# Patient Record
Sex: Female | Born: 1965 | Race: White | Hispanic: No | Smoking: Never smoker
Health system: Southern US, Community
[De-identification: ages and names within clinical notes are randomized; demographics above are authoritative.]

## PROBLEM LIST (undated history)

## (undated) DIAGNOSIS — F419 Anxiety disorder, unspecified: Secondary | ICD-10-CM

## (undated) DIAGNOSIS — Z9889 Other specified postprocedural states: Secondary | ICD-10-CM

## (undated) DIAGNOSIS — F32A Depression, unspecified: Secondary | ICD-10-CM

## (undated) DIAGNOSIS — R112 Nausea with vomiting, unspecified: Secondary | ICD-10-CM

## (undated) DIAGNOSIS — Z8489 Family history of other specified conditions: Secondary | ICD-10-CM

## (undated) DIAGNOSIS — J189 Pneumonia, unspecified organism: Secondary | ICD-10-CM

## (undated) DIAGNOSIS — M1990 Unspecified osteoarthritis, unspecified site: Secondary | ICD-10-CM

## (undated) DIAGNOSIS — K219 Gastro-esophageal reflux disease without esophagitis: Secondary | ICD-10-CM

## (undated) DIAGNOSIS — Z8719 Personal history of other diseases of the digestive system: Secondary | ICD-10-CM

## (undated) DIAGNOSIS — F329 Major depressive disorder, single episode, unspecified: Secondary | ICD-10-CM

## (undated) DIAGNOSIS — R519 Headache, unspecified: Secondary | ICD-10-CM

## (undated) DIAGNOSIS — I10 Essential (primary) hypertension: Secondary | ICD-10-CM

## (undated) HISTORY — PX: BREAST SURGERY: SHX581

## (undated) HISTORY — PX: PTOSIS REPAIR: SHX6568

## (undated) HISTORY — PX: APPENDECTOMY: SHX54

## (undated) HISTORY — PX: TUBAL LIGATION: SHX77

## (undated) HISTORY — PX: VULVA / PERINEUM BIOPSY: SUR155

---
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
## 1998-10-19 ENCOUNTER — Other Ambulatory Visit: Admission: RE | Admit: 1998-10-19 | Discharge: 1998-10-19 | Payer: Self-pay | Admitting: Obstetrics and Gynecology

## 2000-02-09 ENCOUNTER — Other Ambulatory Visit: Admission: RE | Admit: 2000-02-09 | Discharge: 2000-02-09 | Payer: Self-pay | Admitting: Obstetrics and Gynecology

## 2000-02-13 ENCOUNTER — Encounter: Payer: Self-pay | Admitting: Obstetrics and Gynecology

## 2000-02-13 ENCOUNTER — Encounter: Admission: RE | Admit: 2000-02-13 | Discharge: 2000-02-13 | Payer: Self-pay | Admitting: Obstetrics and Gynecology

## 2001-04-15 ENCOUNTER — Other Ambulatory Visit: Admission: RE | Admit: 2001-04-15 | Discharge: 2001-04-15 | Payer: Self-pay | Admitting: Obstetrics and Gynecology

## 2004-06-20 ENCOUNTER — Encounter: Admission: RE | Admit: 2004-06-20 | Discharge: 2004-06-20 | Payer: Self-pay | Admitting: Gastroenterology

## 2004-06-28 ENCOUNTER — Ambulatory Visit (HOSPITAL_COMMUNITY): Admission: RE | Admit: 2004-06-28 | Discharge: 2004-06-28 | Payer: Self-pay | Admitting: Gastroenterology

## 2004-09-13 ENCOUNTER — Other Ambulatory Visit: Admission: RE | Admit: 2004-09-13 | Discharge: 2004-09-13 | Payer: Self-pay | Admitting: Gynecology

## 2006-04-09 HISTORY — PX: BREAST SURGERY: SHX581

## 2006-04-09 HISTORY — PX: ABDOMINAL HYSTERECTOMY: SHX81

## 2006-12-03 ENCOUNTER — Encounter: Admission: RE | Admit: 2006-12-03 | Discharge: 2006-12-03 | Payer: Self-pay | Admitting: Family Medicine

## 2007-05-30 ENCOUNTER — Ambulatory Visit (HOSPITAL_COMMUNITY): Admission: RE | Admit: 2007-05-30 | Discharge: 2007-05-30 | Payer: Self-pay | Admitting: *Deleted

## 2009-09-13 ENCOUNTER — Ambulatory Visit: Admission: RE | Admit: 2009-09-13 | Discharge: 2009-09-13 | Payer: Self-pay | Admitting: Gynecologic Oncology

## 2009-10-06 ENCOUNTER — Ambulatory Visit (HOSPITAL_COMMUNITY): Admission: RE | Admit: 2009-10-06 | Discharge: 2009-10-06 | Payer: Self-pay | Admitting: Obstetrics and Gynecology

## 2010-06-25 LAB — CBC
HCT: 37.1 % (ref 36.0–46.0)
Hemoglobin: 12.8 g/dL (ref 12.0–15.0)
MCH: 30.9 pg (ref 26.0–34.0)
MCHC: 34.5 g/dL (ref 30.0–36.0)
MCV: 89.6 fL (ref 78.0–100.0)
Platelets: 267 10*3/uL (ref 150–400)
RBC: 4.14 MIL/uL (ref 3.87–5.11)
RDW: 12.7 % (ref 11.5–15.5)
WBC: 6.9 10*3/uL (ref 4.0–10.5)

## 2010-06-25 LAB — HCG, SERUM, QUALITATIVE: Preg, Serum: NEGATIVE

## 2010-08-22 NOTE — Op Note (Signed)
NAMEKEREN, Kathy Nguyen                 ACCOUNT NO.:  [id number]   MEDICAL RECORD NO.:  [id number]          PATIENT TYPE:  AMB   LOCATION:  SDC                           FACILITY:  [facility]   PHYSICIAN:  [place] B. Earlene Plater, M.D.  DATE OF BIRTH:  September 12, 1965   DATE OF PROCEDURE:  05/30/2007  DATE OF DISCHARGE:                               OPERATIVE REPORT   PREOPERATIVE DIAGNOSIS:  Labial hypertrophy.   POSTOPERATIVE DIAGNOSIS:  Labial hypertrophy.   PROCEDURE:  Labioplasty.   SURGEON:  Chester Holstein. Earlene Plater, M.D.   ASSISTANT:  None.   ANESTHESIA:  LMA general and local 10 mL 1% Nesacaine.   FINDINGS:  Right greater than left labial hypertrophy.   SPECIMENS:  None.   ESTIMATED BLOOD LOSS:  Minimal.   COMPLICATIONS:  None.   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  In  addition, she has difficulty with hygiene as well as difficulty wearing  tight fitting clothes due to discomfort.  The patient requests reduction  of redundant labial tissue to help in this regard.  She is advised that  this is not a cosmetic procedure, but rather simply debulking of the  labial tissue to improve above symptoms.  The patient advised of the  risks of surgery including infection, bleeding, damage to surrounding  organs, and potential increased risk for dyspareunia postoperatively.   DESCRIPTION OF PROCEDURE:  The patient was taken to the operating room  and LMA anesthesia obtained.  She is prepped and draped in the usual  sterile fashion.  Bladder emptied with in-and-out catheter.  The labia  were inspected.  The left was substantially more prominent than the  right with essentially a 4 x 3 cm area of excessive redundant labial  tissue.  This area was infiltrated and incised with a scalpel.  The cut  edges were reapproximated in a running subcuticular fashion with 4-0  Vicryl.  The right side was inspected and  approximately 2 x 1 cm area of  redundant tissue noted.  The area was infiltrated and excised in the  same manner, closed with a running subcuticular  stitch of 4-0 Vicryl.  Hemostasis was obtained.  The patient tolerated  the procedure well without complications.  She was taken to the recovery  room awake, alert, and in stable condition.  All needle, sponge, and  instrument counts correct per the operating room staff.      [name] B. Earlene Plater, M.D.  Electronically Signed     WBD/MEDQ  D:  05/30/2007  T:  05/31/2007  Job:  [id number]

## 2010-08-25 NOTE — Op Note (Signed)
NAMECHRISTELLA, APP                 ACCOUNT NO.:  [id number]   MEDICAL RECORD NO.:  [id number]          PATIENT TYPE:  AMB   LOCATION:  ENDO                         FACILITY:  [facility]   PHYSICIAN:  Graylin Shiver, M.D.   DATE OF BIRTH:  Feb 26, 1966   DATE OF PROCEDURE:  06/28/2004  DATE OF DISCHARGE:                                 OPERATIVE REPORT   PROCEDURE PERFORMED:  Colonoscopy.   INDICATIONS FOR PROCEDURE:  Abdominal swelling, abdominal discomfort in the  lower abdomen.   Informed consent was obtained after explanation of the risks of bleeding,  infection and perforation.   PREMEDICATION:  Fentanyl 120 mcg IV, Versed 12 milligrams IV.   PROCEDURE:  With the patient in the left lateral decubitus position, a  rectal exam was performed. No masses were felt. The Olympus colonoscope was  inserted into the rectum and advanced around the colon to the cecum. Cecal  landmarks were identified.  The cecum and descending colon were normal. The  transverse colon normal. The descending colon, sigmoid and rectum were  normal. She tolerated the procedure well without complications.   IMPRESSION:  Normal colonoscopy to the cecum.   This patient also had a negative small bowel follow-through.  I think that  her symptoms are most likely secondary to irritable bowel syndrome.      SFG/MEDQ  D:  06/28/2004  T:  06/28/2004  Job:  [id number]   cc:   Leatha Gilding. Mezer, M.D.  [address]  [place]  [place] [address]  Fax: [phone]

## 2010-12-29 LAB — DIFFERENTIAL
Basophils Absolute: 0
Basophils Relative: 0
Eosinophils Absolute: 0.5
Eosinophils Relative: 7 — ABNORMAL HIGH
Lymphocytes Relative: 29
Lymphs Abs: 2
Monocytes Absolute: 0.4
Monocytes Relative: 5
Neutro Abs: 4
Neutrophils Relative %: 58

## 2010-12-29 LAB — CBC
HCT: 37.5
Hemoglobin: 13.1
MCHC: 35
MCV: 86.7
Platelets: 289
RBC: 4.33
RDW: 12.9
WBC: 6.9

## 2010-12-29 LAB — PREGNANCY, URINE: Preg Test, Ur: NEGATIVE

## 2019-07-22 ENCOUNTER — Ambulatory Visit: Payer: Self-pay | Admitting: Orthopaedic Surgery

## 2019-07-22 ENCOUNTER — Ambulatory Visit: Payer: Self-pay

## 2019-07-22 ENCOUNTER — Other Ambulatory Visit: Payer: Self-pay

## 2019-07-22 DIAGNOSIS — M25551 Pain in right hip: Secondary | ICD-10-CM

## 2019-07-22 DIAGNOSIS — M25559 Pain in unspecified hip: Secondary | ICD-10-CM

## 2019-07-22 NOTE — Progress Notes (Signed)
Office Visit Note   Patient: Kathy Nguyen           Date of Birth: October 29, 1965           MRN: [id number] Visit Date: 07/22/2019              Requested by: No referring provider defined for this encounter. PCP: System, Pcp Not In   Assessment & Plan: Visit Diagnoses:  1. 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  At this point given the failure conservative treatment a MRI is warranted to assess the soft tissues around the hip including the insertion of the hip abductor tendons.  Also would like to assess the cartilage of the hip itself and for any other pathology that can be causing the severity of her pain.  She will continue her ice regimen and stretching exercises as well as try topical anti-inflammatory such as Voltaren gel.  Once she has a MRI scheduled she will call us for follow-up appointment a few days afterwards.  Follow-Up Instructions: No follow-ups on file.   Orders:  Orders Placed This Encounter  Procedures  . XR HIP UNILAT W OR W/O PELVIS 1V RIGHT   No orders of the defined types were placed in this encounter.     Procedures: No procedures performed   Clinical Data: No additional findings.   Subjective: Chief Complaint  Patient presents with  . Right Hip - Pain  The patient is a very pleasant 54 year old female that I am seeing for the first time as a patient but have actually seen her before when I take care of her father.  She has been dealing with debilitating right hip pain for at least 7 to 8 months now.  It is on the lateral aspect of her right hip and her IT band.  She does have a little bit of groin pain with this as well.  She has had at least 2 trochanteric injections with steroid by her primary care physician.  The first 1 last year helped significantly but her second injection only lasted for about 4 days.  Both did give her relief but her pain is been quite severe at this point.   She is taking anti-inflammatories which were started to bother her stomach.  She said ice packs have helped her quite a bit.  Her pain is becoming debilitating for her and it is detriment affecting her mobility, her quality of life, and her actives daily living.  She is not injured this area before.  She is not a diabetic.  HPI  Review of Systems There currently no other current medical issues including no headache, chest pain, shortness of breath, fever, chills, nausea, vomiting  Objective: Vital Signs: There were no vitals taken for this visit.  Physical Exam She is alert and orient x3 and in no acute distress Ortho Exam Examination of her right hip shows that she is incredibly sensitive to touch and palpation over the trochanteric area and the IT band.  She also has pain around the groin with any attempts of internal and external rotation of that hip.  It is now more difficult for her to get on shoes and socks and cross her leg on the right side.  Her leg lengths are equal. Specialty Comments:  No specialty comments available.  Imaging: No results found. X-rays that accompany her of her right hip show well-maintained joint space  with no arthritic changes in the right hip and no cortical irregularities around the trochanteric area.  PMFS History: There are no problems to display for this patient.  No past medical history on file.  No family history on file.   Social History   Occupational History  . Not on file  Tobacco Use  . Smoking status: Not on file  Substance and Sexual Activity  . Alcohol use: Not on file  . Drug use: Not on file  . Sexual activity: Not on file

## 2019-08-12 ENCOUNTER — Other Ambulatory Visit: Payer: Self-pay

## 2019-08-12 ENCOUNTER — Ambulatory Visit
Admission: RE | Admit: 2019-08-12 | Discharge: 2019-08-12 | Disposition: A | Discharge status: Home / Self Care | Payer: 59 | Source: Ambulatory Visit | Attending: Orthopaedic Surgery | Admitting: Orthopaedic Surgery

## 2019-08-12 DIAGNOSIS — M25559 Pain in unspecified hip: Secondary | ICD-10-CM

## 2019-08-13 ENCOUNTER — Telehealth: Payer: Self-pay | Admitting: Orthopaedic Surgery

## 2019-08-13 ENCOUNTER — Ambulatory Visit: Payer: Self-pay | Admitting: Orthopaedic Surgery

## 2019-08-13 NOTE — Telephone Encounter (Signed)
Patient aware.

## 2019-08-13 NOTE — Telephone Encounter (Signed)
Let her know that we have not seen the results yet.  I have to wait to see what the radiologist says before I can call her with the results.  And may not be until Monday since I am out of the office operating tomorrow.  Do send this note back to me as a reminder.  Thanks.

## 2019-08-13 NOTE — Telephone Encounter (Signed)
Patient called advised she had MRI yesterday at 8:00am. Patient called asked if Dr Magnus Ivan could call her with the results. The number to contact patient is [phone]

## 2019-08-17 ENCOUNTER — Encounter: Payer: Self-pay | Admitting: Orthopaedic Surgery

## 2019-08-17 ENCOUNTER — Other Ambulatory Visit: Payer: Self-pay

## 2019-08-17 ENCOUNTER — Ambulatory Visit (INDEPENDENT_AMBULATORY_CARE_PROVIDER_SITE_OTHER): Payer: 59 | Admitting: Orthopaedic Surgery

## 2019-08-17 VITALS — Ht 65.35 in | Wt 162.4 lb

## 2019-08-17 DIAGNOSIS — M25551 Pain in right hip: Secondary | ICD-10-CM

## 2019-08-17 DIAGNOSIS — M1611 Unilateral primary osteoarthritis, right hip: Secondary | ICD-10-CM | POA: Diagnosis not present

## 2019-08-17 NOTE — Progress Notes (Signed)
Office Visit Note   Patient: Kathy Nguyen           Date of Birth: [birth year]           MRN: [id number] Visit Date: 08/17/2019              Requested by: No referring provider defined for this encounter. PCP: System, Pcp Not In   Assessment & Plan: Visit Diagnoses:  1. Pain in right hip   2. Unilateral primary osteoarthritis, right hip     Plan: She does have quite severe arthritis of her right hip on MRI findings.  This does correlate with her clinical exam as well.  She also has severe tendinosis of the trochanteric tendons and this is causing an associated bursitis.  At this point, I am recommending hip replacement given her MRI findings and clinical exam findings.  I explained in detail the rationale behind this recommendation and the risk and benefits involved.  At this point given the detrimental effect her hip pain is having on her quality of life combined with the MRI findings and the failed conservative treatment, she agrees with proceeding with hip replacement surgery.  We had a long thorough discussion about her interoperative and postoperative course.  We talked about the risk and benefits of surgery.  She is interested in having this scheduled.  All questions and concerns were answered and addressed.  Follow-Up Instructions: Return for 2 weeks post-op.   Orders:  No orders of the defined types were placed in this encounter.  No orders of the defined types were placed in this encounter.     Procedures: No procedures performed   Clinical Data: No additional findings.   Subjective: Chief Complaint  Patient presents with  . Right Hip - Follow-up  The patient comes in for follow-up after having a MRI of the right hip.  I was concerned about the severity of her right hip pain and the failure of multiple steroid injections over the trochanteric area that was done elsewhere.  She has significant pain that radiates from the hip into her groin and into her back with  down her leg as well to her knee.  Her plain films were negative for any type of abnormalities of the hip but due to the severity of her pain special rotation of the hip I felt MRI was warranted.  She is here for return to this today.  Her pain is daily and is 10 out of 10 with her right hip.  At this point is detriment affecting her mobility, quality of life and activities daily living and is getting worse.  She feels miserable from her right hip pain.  She has tried and failed conservative treatment for a long period time and at this point is quite frustrated due to her pain.  She is otherwise healthy individual and has high blood pressure for which she is treated medically.  HPI  Review of Systems She currently denies any headache, chest pain, shortness of breath, fever, chills, nausea, vomiting  Objective: Vital Signs: There were no vitals taken for this visit.  Physical Exam She is alert and orient x3 and in no acute distress but obvious discomfort Ortho Exam On exam she ambulates slowly.  She has severe pain with range of motion of her right hip especially in the groin. Specialty Comments:  No specialty comments available.  Imaging: No results found. I did review the MRI of her right hip with her.  We  looked at the images and there is significant cystic changes in the acetabulum of the right hip.  There is degenerative labral tearing as well.  There is cartilage loss of the right femoral head.  PMFS History: Patient Active Problem List   Diagnosis Date Noted  . Unilateral primary osteoarthritis, right hip 08/17/2019   History reviewed. No pertinent past medical history.  History reviewed. No pertinent family history.  History reviewed. No pertinent surgical history. Social History   Occupational History  . Not on file  Tobacco Use  . Smoking status: Not on file  Substance and Sexual Activity  . Alcohol use: Not on file  . Drug use: Not on file  . Sexual activity: Not on  file

## 2019-08-18 ENCOUNTER — Ambulatory Visit: Payer: Self-pay | Admitting: Orthopaedic Surgery

## 2019-08-25 ENCOUNTER — Other Ambulatory Visit: Payer: Self-pay

## 2019-08-25 ENCOUNTER — Telehealth: Payer: Self-pay | Admitting: Radiology

## 2019-08-25 NOTE — Telephone Encounter (Signed)
LMOM for patient of the below message  

## 2019-08-25 NOTE — Telephone Encounter (Signed)
I am fine with her having a dental procedure today.  Her surgery is not for 2 weeks and should not be an issue.

## 2019-08-25 NOTE — Telephone Encounter (Signed)
Patient left voicemail on triage line. She is scheduled for a dental appointment today where they will use a laser to remove gum tissue. This is not a root canal or filling, but is a dental procedure. She is scheduled for total hip on 09/08/2019, and after reading up on the procedure, thinks she may need to cancel the dental appt today until after surgery. Please call to advise whether or not this should be cancelled. Her appt is scheduled for 1pm.  CB [phone]

## 2019-08-27 ENCOUNTER — Other Ambulatory Visit: Payer: Self-pay | Admitting: Physician Assistant

## 2019-09-03 ENCOUNTER — Ambulatory Visit (INDEPENDENT_AMBULATORY_CARE_PROVIDER_SITE_OTHER): Payer: 59 | Admitting: Orthopaedic Surgery

## 2019-09-03 ENCOUNTER — Encounter: Payer: Self-pay | Admitting: Orthopaedic Surgery

## 2019-09-03 ENCOUNTER — Other Ambulatory Visit: Payer: Self-pay

## 2019-09-03 DIAGNOSIS — M1611 Unilateral primary osteoarthritis, right hip: Secondary | ICD-10-CM

## 2019-09-03 NOTE — Progress Notes (Signed)
[facility], [place] [address] Phone: [phone] Fax: [phone]      Your procedure is scheduled on September 08, 2019.  Report to [facility] Main Entrance "A" at 12:00 PM, and check in at the Admitting office.  Call this number if you have problems the morning of surgery:  [phone]  Call [phone] if you have any questions prior to your surgery date Monday-Friday 8am-4pm    Remember:  Do not eat after midnight the night before your surgery  You may drink clear liquids until 11:00 am the morning of your surgery.   Clear liquids allowed are: Water, Non-Citrus Juices (without pulp), Carbonated Beverages, Clear Tea, Black Coffee Only, and Gatorade   Enhanced Recovery after Surgery for Orthopedics Enhanced Recovery after Surgery is a protocol used to improve the stress on your body and your recovery after surgery.  Patient Instructions  . The night before surgery:  o No food after midnight. ONLY clear liquids after midnight  . The day of surgery (if you do NOT have diabetes):  o Drink ONE (1) Pre-Surgery Clear Ensure as directed.   o This drink was given to you during your hospital  pre-op appointment visit. o The pre-op nurse will instruct you on the time to drink the  Pre-Surgery Ensure depending on your surgery time. o Finish the drink at the designated time by 11:00 AM o Nothing else to drink after completing the  Pre-Surgery Clear Ensure.         If you have questions, please contact your surgeon's office.     Take these medicines the morning of surgery with A SIP OF WATER : Bupropion (Wellbutrin XL) Estradiol (Estrace) Alprazolam (Xanax) if needed Dicyclomine (Bentyl) if needed Loratadine (Claritin)  If needed Valacyclovir (Valtrex) if needed  As of today, STOP taking any Aspirin (unless otherwise instructed by your surgeon) and Aspirin containing products, Aleve, Naproxen, Ibuprofen,  Motrin, Advil, Goody's, BC's, all herbal medications, fish oil, and all vitamins.                     Do not wear jewelry, make up, or nail polish            Do not wear lotions, powders, perfumes/colognes, or deodorant.            Do not shave 48 hours prior to surgery.             Do not bring valuables to the hospital.            [facility] is not responsible for any belongings or valuables.  Do NOT Smoke (Tobacco/Vapping) or drink Alcohol 24 hours prior to your procedure If you use a CPAP at night, you may bring all equipment for your overnight stay.   Contacts, glasses, dentures or bridgework may not be worn into surgery.      For patients admitted to the hospital, discharge time will be determined by your treatment team.   Patients discharged the day of surgery will not be allowed to drive home, and someone needs to stay with them for 24 hours.    Special instructions:   [facility]- Preparing For Surgery  Before surgery, you can play an important role. Because skin is not sterile, your skin needs to be as free of germs as possible. You can reduce the number of germs on your skin by washing with CHG (chlorahexidine gluconate)  Soap before surgery.  CHG is an antiseptic cleaner which kills germs and bonds with the skin to continue killing germs even after washing.    Oral Hygiene is also important to reduce your risk of infection.  Remember - BRUSH YOUR TEETH THE MORNING OF SURGERY WITH YOUR REGULAR TOOTHPASTE  Please do not use if you have an allergy to CHG or antibacterial soaps. If your skin becomes reddened/irritated stop using the CHG.  Do not shave (including legs and underarms) for at least 48 hours prior to first CHG shower. It is OK to shave your face.  Please follow these instructions carefully.   1. Shower the NIGHT BEFORE SURGERY and the MORNING OF SURGERY with CHG Soap.   2. If you chose to wash your hair, wash your hair first as usual with your normal  shampoo.  3. After you shampoo, rinse your hair and body thoroughly to remove the shampoo.  4. Use CHG as you would any other liquid soap. You can apply CHG directly to the skin and wash gently with a scrungie or a clean washcloth.   5. Apply the CHG Soap to your body ONLY FROM THE NECK DOWN.  Do not use on open wounds or open sores. Avoid contact with your eyes, ears, mouth and genitals (private parts). Wash Face and genitals (private parts)  with your normal soap.   6. Wash thoroughly, paying special attention to the area where your surgery will be performed.  7. Thoroughly rinse your body with warm water from the neck down.  8. DO NOT shower/wash with your normal soap after using and rinsing off the CHG Soap.  9. Pat yourself dry with a CLEAN TOWEL.  10. Wear CLEAN PAJAMAS to bed the night before surgery, wear comfortable clothes the morning of surgery  11. Place CLEAN SHEETS on your bed the night of your first shower and DO NOT SLEEP WITH PETS.   Day of Surgery:   Do not apply any deodorants/lotions.  Please wear clean clothes to the hospital/surgery center.   Remember to brush your teeth WITH YOUR REGULAR TOOTHPASTE.   Please read over the following fact sheets that you were given.

## 2019-09-03 NOTE — Progress Notes (Signed)
The patient comes in today with appropriate questions as it relates to her hip replacement surgery next week.  She is scheduled for right total hip arthroplasty.  She is having a significant amount of pain and just feels tired and fatigued and sick.  I gave her reassurance that I have a lot of patients to get frustrated with just dealing with the pain of an arthritic joint and that can wear them out and wear them down.  She is having her preoperative labs tomorrow.  I gave her reassurance that if anything stands out with those labs we would let her know.  She had a lot of appropriate questions as a relates to the incision and how long she will be in the hospital and other questions as a relates to hip replacement surgery.  All his questions were answered and concerns addressed.  We will see her on this upcoming Tuesday for her surgery.

## 2019-09-04 ENCOUNTER — Encounter (HOSPITAL_COMMUNITY)
Admission: RE | Admit: 2019-09-04 | Discharge: 2019-09-04 | Disposition: A | Discharge status: Home / Self Care | Payer: 59 | Source: Ambulatory Visit | Attending: Orthopaedic Surgery | Admitting: Orthopaedic Surgery

## 2019-09-04 ENCOUNTER — Other Ambulatory Visit: Payer: Self-pay

## 2019-09-04 ENCOUNTER — Other Ambulatory Visit (HOSPITAL_COMMUNITY)
Admission: RE | Admit: 2019-09-04 | Discharge: 2019-09-04 | Disposition: A | Discharge status: Home / Self Care | Payer: 59 | Source: Ambulatory Visit | Attending: Orthopaedic Surgery | Admitting: Orthopaedic Surgery

## 2019-09-04 ENCOUNTER — Encounter (HOSPITAL_COMMUNITY): Payer: Self-pay

## 2019-09-04 DIAGNOSIS — Z20822 Contact with and (suspected) exposure to covid-19: Secondary | ICD-10-CM | POA: Insufficient documentation

## 2019-09-04 DIAGNOSIS — Z01818 Encounter for other preprocedural examination: Secondary | ICD-10-CM | POA: Insufficient documentation

## 2019-09-04 HISTORY — DX: Headache, unspecified: R51.9

## 2019-09-04 HISTORY — DX: Nausea with vomiting, unspecified: R11.2

## 2019-09-04 HISTORY — DX: Unspecified osteoarthritis, unspecified site: M19.90

## 2019-09-04 HISTORY — DX: Pneumonia, unspecified organism: J18.9

## 2019-09-04 HISTORY — DX: Depression, unspecified: F32.A

## 2019-09-04 HISTORY — DX: Gastro-esophageal reflux disease without esophagitis: K21.9

## 2019-09-04 HISTORY — DX: Anxiety disorder, unspecified: F41.9

## 2019-09-04 HISTORY — DX: Family history of other specified conditions: Z84.89

## 2019-09-04 HISTORY — DX: Essential (primary) hypertension: I10

## 2019-09-04 HISTORY — DX: Other specified postprocedural states: Z98.890

## 2019-09-04 HISTORY — DX: Personal history of other diseases of the digestive system: Z87.19

## 2019-09-04 LAB — BASIC METABOLIC PANEL
Anion gap: 9 (ref 5–15)
BUN: 12 mg/dL (ref 6–20)
CO2: 28 mmol/L (ref 22–32)
Calcium: 9.5 mg/dL (ref 8.9–10.3)
Chloride: 102 mmol/L (ref 98–111)
Creatinine, Ser: 0.97 mg/dL (ref 0.44–1.00)
GFR calc Af Amer: >60 mL/min (ref >60)
GFR calc non Af Amer: >60 mL/min (ref >60)
Glucose, Bld: 107 mg/dL — ABNORMAL HIGH (ref 70–99)
Potassium: 3.9 mmol/L (ref 3.5–5.1)
Sodium: 139 mmol/L (ref 135–145)

## 2019-09-04 LAB — ABO/RH: ABO/RH(D): O POS

## 2019-09-04 LAB — CBC
HCT: 40.4 % (ref 36.0–46.0)
Hemoglobin: 12.9 g/dL (ref 12.0–15.0)
MCH: 29.5 pg (ref 26.0–34.0)
MCHC: 31.9 g/dL (ref 30.0–36.0)
MCV: 92.2 fL (ref 80.0–100.0)
Platelets: 285 10*3/uL (ref 150–400)
RBC: 4.38 MIL/uL (ref 3.87–5.11)
RDW: 12.5 % (ref 11.5–15.5)
WBC: 6.5 10*3/uL (ref 4.0–10.5)
nRBC: 0 % (ref 0.0–0.2)

## 2019-09-04 LAB — SURGICAL PCR SCREEN
MRSA, PCR: NEGATIVE
Staphylococcus aureus: POSITIVE — AB

## 2019-09-04 LAB — TYPE AND SCREEN
ABO/RH(D): O POS
Antibody Screen: NEGATIVE

## 2019-09-04 LAB — SARS CORONAVIRUS 2 (TAT 6-24 HRS): SARS Coronavirus 2: NEGATIVE

## 2019-09-04 NOTE — Progress Notes (Signed)
PCP - Dr. Amada Kingfisher [facility] in [place] Cardiologist - Denies  Chest x-ray - N/A EKG -09/04/19  Stress Test - Denies ECHO - Denies Cardiac Cath - Denies  Sleep Study - Denies  DM - Denies  ERAS Protcol -Yes PRE-SURGERY Ensure or G2- Given  COVID TEST- 09/04/19  Anesthesia review: Not Needed  Patient denies shortness of breath, fever, cough and chest pain at PAT appointment   All instructions explained to the patient, with a verbal understanding of the material. Patient agrees to go over the instructions while at home for a better understanding. Patient also instructed to self quarantine after being tested for COVID-19. The opportunity to ask questions was provided.

## 2019-09-08 ENCOUNTER — Encounter (HOSPITAL_COMMUNITY)
Admission: AD | Disposition: A | Discharge status: Home Health | Payer: Self-pay | Source: Home / Self Care | Attending: Orthopaedic Surgery

## 2019-09-08 ENCOUNTER — Other Ambulatory Visit: Payer: Self-pay

## 2019-09-08 ENCOUNTER — Inpatient Hospital Stay (HOSPITAL_COMMUNITY)
Admission: AD | Admit: 2019-09-08 | Discharge: 2019-09-11 | DRG: 470 | Disposition: A | Discharge status: Home Health | Payer: 59 | Attending: Orthopaedic Surgery | Admitting: Orthopaedic Surgery

## 2019-09-08 ENCOUNTER — Ambulatory Visit (HOSPITAL_COMMUNITY): Payer: 59 | Admitting: Physician Assistant

## 2019-09-08 ENCOUNTER — Ambulatory Visit (HOSPITAL_COMMUNITY): Payer: 59

## 2019-09-08 ENCOUNTER — Ambulatory Visit (HOSPITAL_COMMUNITY): Payer: 59 | Admitting: Certified Registered Nurse Anesthetist

## 2019-09-08 ENCOUNTER — Encounter (HOSPITAL_COMMUNITY): Payer: Self-pay | Admitting: Orthopaedic Surgery

## 2019-09-08 ENCOUNTER — Observation Stay (HOSPITAL_COMMUNITY): Payer: 59

## 2019-09-08 DIAGNOSIS — Z419 Encounter for procedure for purposes other than remedying health state, unspecified: Secondary | ICD-10-CM

## 2019-09-08 DIAGNOSIS — M1611 Unilateral primary osteoarthritis, right hip: Principal | ICD-10-CM | POA: Diagnosis present

## 2019-09-08 DIAGNOSIS — Z96641 Presence of right artificial hip joint: Secondary | ICD-10-CM

## 2019-09-08 DIAGNOSIS — Z881 Allergy status to other antibiotic agents status: Secondary | ICD-10-CM

## 2019-09-08 DIAGNOSIS — K219 Gastro-esophageal reflux disease without esophagitis: Secondary | ICD-10-CM | POA: Diagnosis present

## 2019-09-08 DIAGNOSIS — Z90710 Acquired absence of both cervix and uterus: Secondary | ICD-10-CM

## 2019-09-08 DIAGNOSIS — I10 Essential (primary) hypertension: Secondary | ICD-10-CM | POA: Diagnosis present

## 2019-09-08 DIAGNOSIS — F329 Major depressive disorder, single episode, unspecified: Secondary | ICD-10-CM | POA: Diagnosis present

## 2019-09-08 DIAGNOSIS — F419 Anxiety disorder, unspecified: Secondary | ICD-10-CM | POA: Diagnosis present

## 2019-09-08 DIAGNOSIS — Z880 Allergy status to penicillin: Secondary | ICD-10-CM

## 2019-09-08 HISTORY — PX: TOTAL HIP ARTHROPLASTY: SHX124

## 2019-09-08 SURGERY — ARTHROPLASTY, HIP, TOTAL, ANTERIOR APPROACH
Anesthesia: Spinal | Site: Hip | Laterality: Right

## 2019-09-08 MED ORDER — MIDAZOLAM HCL 2 MG/2ML IJ SOLN
INTRAMUSCULAR | Status: AC
  Filled 2019-09-08: qty 2

## 2019-09-08 MED ORDER — OXYCODONE HCL 5 MG/5ML PO SOLN: 5.0000 mg | Freq: Once | ORAL | Status: DC | PRN

## 2019-09-08 MED ORDER — ORAL CARE MOUTH RINSE: 15.0000 mL | Freq: Once | OROMUCOSAL | Status: AC

## 2019-09-08 MED ORDER — LORATADINE 10 MG PO TABS: 10.0000 mg | ORAL_TABLET | Freq: Every day | ORAL | Status: DC | PRN

## 2019-09-08 MED ORDER — POLYETHYLENE GLYCOL 3350 17 G PO PACK: 17.0000 g | PACK | Freq: Every day | ORAL | Status: DC | PRN

## 2019-09-08 MED ORDER — DEXAMETHASONE SODIUM PHOSPHATE 10 MG/ML IJ SOLN
INTRAMUSCULAR | Status: AC
  Filled 2019-09-08: qty 1

## 2019-09-08 MED ORDER — FENTANYL CITRATE (PF) 100 MCG/2ML IJ SOLN
INTRAMUSCULAR | Status: AC
  Filled 2019-09-08: qty 2

## 2019-09-08 MED ORDER — KETOROLAC TROMETHAMINE 15 MG/ML IJ SOLN
INTRAMUSCULAR | Status: AC
  Filled 2019-09-08: qty 1

## 2019-09-08 MED ORDER — TRANEXAMIC ACID-NACL 1000-0.7 MG/100ML-% IV SOLN
1000.0000 mg | INTRAVENOUS | Status: AC
  Administered 2019-09-08: 1000 mg via INTRAVENOUS

## 2019-09-08 MED ORDER — ASPIRIN 81 MG PO CHEW
81.0000 mg | CHEWABLE_TABLET | Freq: Two times a day (BID) | ORAL | Status: DC
  Administered 2019-09-08 – 2019-09-11 (×6): 81 mg via ORAL
  Filled 2019-09-08 (×6): qty 1

## 2019-09-08 MED ORDER — CEFAZOLIN SODIUM-DEXTROSE 2-4 GM/100ML-% IV SOLN
INTRAVENOUS | Status: AC
  Filled 2019-09-08: qty 100

## 2019-09-08 MED ORDER — PANTOPRAZOLE SODIUM 40 MG PO TBEC
40.0000 mg | DELAYED_RELEASE_TABLET | Freq: Every day | ORAL | Status: DC
  Administered 2019-09-08 – 2019-09-11 (×4): 40 mg via ORAL
  Filled 2019-09-08 (×4): qty 1

## 2019-09-08 MED ORDER — PROMETHAZINE HCL 25 MG/ML IJ SOLN
6.2500 mg | INTRAMUSCULAR | Status: DC | PRN
  Administered 2019-09-08: 6.25 mg via INTRAVENOUS

## 2019-09-08 MED ORDER — OXYCODONE HCL 5 MG PO TABS
5.0000 mg | ORAL_TABLET | ORAL | Status: DC | PRN
  Administered 2019-09-08 – 2019-09-09 (×2): 10 mg via ORAL
  Filled 2019-09-08 (×4): qty 2

## 2019-09-08 MED ORDER — KETOROLAC TROMETHAMINE 15 MG/ML IJ SOLN
7.5000 mg | Freq: Four times a day (QID) | INTRAMUSCULAR | Status: AC
  Administered 2019-09-08 – 2019-09-09 (×4): 7.5 mg via INTRAVENOUS
  Filled 2019-09-08 (×3): qty 1

## 2019-09-08 MED ORDER — ROCURONIUM BROMIDE 10 MG/ML (PF) SYRINGE
PREFILLED_SYRINGE | INTRAVENOUS | Status: AC
  Filled 2019-09-08: qty 10

## 2019-09-08 MED ORDER — SODIUM CHLORIDE 0.9 % IR SOLN
Status: DC | PRN
  Administered 2019-09-08: 3000 mL

## 2019-09-08 MED ORDER — PHENYLEPHRINE 40 MCG/ML (10ML) SYRINGE FOR IV PUSH (FOR BLOOD PRESSURE SUPPORT)
PREFILLED_SYRINGE | INTRAVENOUS | Status: AC
  Filled 2019-09-08: qty 10

## 2019-09-08 MED ORDER — CEFAZOLIN SODIUM-DEXTROSE 1-4 GM/50ML-% IV SOLN
1.0000 g | Freq: Four times a day (QID) | INTRAVENOUS | Status: AC
  Administered 2019-09-08 – 2019-09-09 (×2): 1 g via INTRAVENOUS
  Filled 2019-09-08 (×2): qty 50

## 2019-09-08 MED ORDER — OXYCODONE HCL 5 MG PO TABS
10.0000 mg | ORAL_TABLET | ORAL | Status: DC | PRN
  Administered 2019-09-09 – 2019-09-10 (×4): 10 mg via ORAL
  Administered 2019-09-10 (×2): 15 mg via ORAL
  Administered 2019-09-11: 10 mg via ORAL
  Administered 2019-09-11 (×2): 15 mg via ORAL
  Filled 2019-09-08 (×4): qty 3
  Filled 2019-09-08: qty 2
  Filled 2019-09-08: qty 3
  Filled 2019-09-08 (×3): qty 2

## 2019-09-08 MED ORDER — BUPROPION HCL ER (XL) 150 MG PO TB24
300.0000 mg | ORAL_TABLET | Freq: Every day | ORAL | Status: DC
  Administered 2019-09-09 – 2019-09-11 (×3): 300 mg via ORAL
  Filled 2019-09-08 (×3): qty 2

## 2019-09-08 MED ORDER — HYDROMORPHONE HCL 1 MG/ML IJ SOLN
0.5000 mg | INTRAMUSCULAR | Status: DC | PRN
  Administered 2019-09-08 – 2019-09-10 (×7): 1 mg via INTRAVENOUS
  Filled 2019-09-08 (×7): qty 1

## 2019-09-08 MED ORDER — FENTANYL CITRATE (PF) 100 MCG/2ML IJ SOLN
INTRAMUSCULAR | Status: DC | PRN
  Administered 2019-09-08: 50 ug via INTRAVENOUS

## 2019-09-08 MED ORDER — CEFAZOLIN SODIUM-DEXTROSE 2-4 GM/100ML-% IV SOLN
2.0000 g | INTRAVENOUS | Status: AC
  Administered 2019-09-08: 2 g via INTRAVENOUS

## 2019-09-08 MED ORDER — HYDROCHLOROTHIAZIDE 12.5 MG PO CAPS
12.5000 mg | ORAL_CAPSULE | Freq: Every day | ORAL | Status: DC
  Administered 2019-09-08 – 2019-09-11 (×4): 12.5 mg via ORAL
  Filled 2019-09-08 (×4): qty 1

## 2019-09-08 MED ORDER — FENTANYL CITRATE (PF) 250 MCG/5ML IJ SOLN
INTRAMUSCULAR | Status: AC
  Filled 2019-09-08: qty 5

## 2019-09-08 MED ORDER — ACETAMINOPHEN 325 MG PO TABS
325.0000 mg | ORAL_TABLET | Freq: Four times a day (QID) | ORAL | Status: DC | PRN
  Administered 2019-09-10 – 2019-09-11 (×2): 650 mg via ORAL
  Filled 2019-09-08 (×2): qty 2

## 2019-09-08 MED ORDER — IRBESARTAN 150 MG PO TABS
150.0000 mg | ORAL_TABLET | Freq: Every day | ORAL | Status: DC
  Administered 2019-09-08 – 2019-09-11 (×4): 150 mg via ORAL
  Filled 2019-09-08 (×4): qty 1

## 2019-09-08 MED ORDER — VALACYCLOVIR HCL 500 MG PO TABS
500.0000 mg | ORAL_TABLET | Freq: Every day | ORAL | Status: DC
  Administered 2019-09-10: 500 mg via ORAL
  Filled 2019-09-08 (×3): qty 1

## 2019-09-08 MED ORDER — LACTATED RINGERS IV SOLN: INTRAVENOUS | Status: DC

## 2019-09-08 MED ORDER — TRANEXAMIC ACID-NACL 1000-0.7 MG/100ML-% IV SOLN
INTRAVENOUS | Status: AC
  Filled 2019-09-08: qty 100

## 2019-09-08 MED ORDER — PROPOFOL 10 MG/ML IV BOLUS
INTRAVENOUS | Status: DC | PRN
  Administered 2019-09-08: 30 mg via INTRAVENOUS
  Administered 2019-09-08 (×2): 10 mg via INTRAVENOUS
  Administered 2019-09-08: 20 mg via INTRAVENOUS
  Administered 2019-09-08 (×2): 10 mg via INTRAVENOUS

## 2019-09-08 MED ORDER — PROPOFOL 10 MG/ML IV BOLUS
INTRAVENOUS | Status: AC
  Filled 2019-09-08: qty 20

## 2019-09-08 MED ORDER — DEXAMETHASONE SODIUM PHOSPHATE 10 MG/ML IJ SOLN
INTRAMUSCULAR | Status: DC | PRN
Start: 2019-09-08 — End: 2019-09-08
  Administered 2019-09-08: 4 mg via INTRAVENOUS

## 2019-09-08 MED ORDER — PROMETHAZINE HCL 25 MG/ML IJ SOLN
INTRAMUSCULAR | Status: AC
  Filled 2019-09-08: qty 1

## 2019-09-08 MED ORDER — ESTRADIOL 1 MG PO TABS
1.0000 mg | ORAL_TABLET | Freq: Every day | ORAL | Status: DC
  Administered 2019-09-09 – 2019-09-11 (×3): 1 mg via ORAL
  Filled 2019-09-08 (×3): qty 1

## 2019-09-08 MED ORDER — ALUM & MAG HYDROXIDE-SIMETH 200-200-20 MG/5ML PO SUSP: 30.0000 mL | ORAL | Status: DC | PRN

## 2019-09-08 MED ORDER — METHOCARBAMOL 1000 MG/10ML IJ SOLN
500.0000 mg | Freq: Four times a day (QID) | INTRAVENOUS | Status: DC | PRN
  Filled 2019-09-08: qty 5

## 2019-09-08 MED ORDER — METOCLOPRAMIDE HCL 5 MG/ML IJ SOLN: 5.0000 mg | Freq: Three times a day (TID) | INTRAMUSCULAR | Status: DC | PRN

## 2019-09-08 MED ORDER — BUPIVACAINE IN DEXTROSE 0.75-8.25 % IT SOLN
INTRATHECAL | Status: DC | PRN
  Administered 2019-09-08: 1.6 mL via INTRATHECAL

## 2019-09-08 MED ORDER — ONDANSETRON HCL 4 MG/2ML IJ SOLN
INTRAMUSCULAR | Status: DC | PRN
  Administered 2019-09-08: 4 mg via INTRAVENOUS

## 2019-09-08 MED ORDER — 0.9 % SODIUM CHLORIDE (POUR BTL) OPTIME
TOPICAL | Status: DC | PRN
  Administered 2019-09-08: 1000 mL

## 2019-09-08 MED ORDER — METHOCARBAMOL 500 MG PO TABS
500.0000 mg | ORAL_TABLET | Freq: Four times a day (QID) | ORAL | Status: DC | PRN
  Administered 2019-09-08 – 2019-09-10 (×6): 500 mg via ORAL
  Filled 2019-09-08 (×5): qty 1

## 2019-09-08 MED ORDER — ACETAMINOPHEN 500 MG PO TABS
ORAL_TABLET | ORAL | Status: AC
  Administered 2019-09-08: 1000 mg via ORAL
  Filled 2019-09-08: qty 2

## 2019-09-08 MED ORDER — FENTANYL CITRATE (PF) 100 MCG/2ML IJ SOLN
25.0000 ug | INTRAMUSCULAR | Status: DC | PRN
  Administered 2019-09-08: 50 ug via INTRAVENOUS
  Administered 2019-09-08: 25 ug via INTRAVENOUS

## 2019-09-08 MED ORDER — CHLORHEXIDINE GLUCONATE 0.12 % MT SOLN: 15.0000 mL | Freq: Once | OROMUCOSAL | Status: AC

## 2019-09-08 MED ORDER — PROPOFOL 500 MG/50ML IV EMUL
INTRAVENOUS | Status: DC | PRN
  Administered 2019-09-08: 150 ug/kg/min via INTRAVENOUS

## 2019-09-08 MED ORDER — METOCLOPRAMIDE HCL 5 MG/ML IJ SOLN
INTRAMUSCULAR | Status: AC
  Filled 2019-09-08: qty 2

## 2019-09-08 MED ORDER — MIDAZOLAM HCL 5 MG/5ML IJ SOLN
INTRAMUSCULAR | Status: DC | PRN
  Administered 2019-09-08: 2 mg via INTRAVENOUS

## 2019-09-08 MED ORDER — SODIUM CHLORIDE 0.9 % IV SOLN: INTRAVENOUS | Status: DC

## 2019-09-08 MED ORDER — EPHEDRINE 5 MG/ML INJ
INTRAVENOUS | Status: AC
  Filled 2019-09-08: qty 10

## 2019-09-08 MED ORDER — ACETAMINOPHEN 500 MG PO TABS: 1000.0000 mg | ORAL_TABLET | Freq: Once | ORAL | Status: AC

## 2019-09-08 MED ORDER — ALPRAZOLAM 0.25 MG PO TABS
ORAL_TABLET | ORAL | Status: AC
  Filled 2019-09-08: qty 1

## 2019-09-08 MED ORDER — CHLORHEXIDINE GLUCONATE 0.12 % MT SOLN
OROMUCOSAL | Status: AC
  Administered 2019-09-08: 15 mL via OROMUCOSAL
  Filled 2019-09-08: qty 15

## 2019-09-08 MED ORDER — ONDANSETRON HCL 4 MG PO TABS: 4.0000 mg | ORAL_TABLET | Freq: Four times a day (QID) | ORAL | Status: DC | PRN

## 2019-09-08 MED ORDER — LIDOCAINE 2% (20 MG/ML) 5 ML SYRINGE
INTRAMUSCULAR | Status: AC
  Filled 2019-09-08: qty 5

## 2019-09-08 MED ORDER — METOCLOPRAMIDE HCL 5 MG/ML IJ SOLN
5.0000 mg | Freq: Once | INTRAMUSCULAR | Status: AC
  Administered 2019-09-08: 5 mg via INTRAVENOUS

## 2019-09-08 MED ORDER — OXYCODONE HCL 5 MG PO TABS: 5.0000 mg | ORAL_TABLET | Freq: Once | ORAL | Status: DC | PRN

## 2019-09-08 MED ORDER — DIPHENHYDRAMINE HCL 12.5 MG/5ML PO ELIX: 12.5000 mg | ORAL_SOLUTION | ORAL | Status: DC | PRN

## 2019-09-08 MED ORDER — METOCLOPRAMIDE HCL 5 MG PO TABS: 5.0000 mg | ORAL_TABLET | Freq: Three times a day (TID) | ORAL | Status: DC | PRN

## 2019-09-08 MED ORDER — METHOCARBAMOL 500 MG PO TABS
ORAL_TABLET | ORAL | Status: AC
  Filled 2019-09-08: qty 1

## 2019-09-08 MED ORDER — ONDANSETRON HCL 4 MG/2ML IJ SOLN
INTRAMUSCULAR | Status: AC
  Filled 2019-09-08: qty 2

## 2019-09-08 MED ORDER — DOCUSATE SODIUM 100 MG PO CAPS
100.0000 mg | ORAL_CAPSULE | Freq: Two times a day (BID) | ORAL | Status: DC
  Administered 2019-09-08 – 2019-09-11 (×6): 100 mg via ORAL
  Filled 2019-09-08 (×6): qty 1

## 2019-09-08 MED ORDER — POVIDONE-IODINE 10 % EX SWAB
2.0000 "application " | Freq: Once | CUTANEOUS | Status: AC
  Administered 2019-09-08: 2 via TOPICAL

## 2019-09-08 MED ORDER — PHENOL 1.4 % MT LIQD: 1.0000 | OROMUCOSAL | Status: DC | PRN

## 2019-09-08 MED ORDER — GABAPENTIN 100 MG PO CAPS
100.0000 mg | ORAL_CAPSULE | Freq: Three times a day (TID) | ORAL | Status: DC
  Administered 2019-09-08 – 2019-09-11 (×9): 100 mg via ORAL
  Filled 2019-09-08 (×9): qty 1

## 2019-09-08 MED ORDER — ONDANSETRON HCL 4 MG/2ML IJ SOLN
4.0000 mg | Freq: Four times a day (QID) | INTRAMUSCULAR | Status: DC | PRN
  Administered 2019-09-09 – 2019-09-11 (×6): 4 mg via INTRAVENOUS
  Filled 2019-09-08 (×6): qty 2

## 2019-09-08 MED ORDER — ALPRAZOLAM 0.25 MG PO TABS
0.2500 mg | ORAL_TABLET | Freq: Two times a day (BID) | ORAL | Status: DC | PRN
  Administered 2019-09-08 – 2019-09-09 (×3): 0.25 mg via ORAL
  Filled 2019-09-08 (×2): qty 1

## 2019-09-08 MED ORDER — LIDOCAINE 2% (20 MG/ML) 5 ML SYRINGE
INTRAMUSCULAR | Status: DC | PRN
  Administered 2019-09-08: 40 mg via INTRAVENOUS

## 2019-09-08 MED ORDER — OLMESARTAN MEDOXOMIL-HCTZ 20-12.5 MG PO TABS: 1.0000 | ORAL_TABLET | Freq: Every morning | ORAL | Status: DC

## 2019-09-08 MED ORDER — PROPOFOL 1000 MG/100ML IV EMUL
INTRAVENOUS | Status: AC
  Filled 2019-09-08: qty 100

## 2019-09-08 MED ORDER — MENTHOL 3 MG MT LOZG: 1.0000 | LOZENGE | OROMUCOSAL | Status: DC | PRN

## 2019-09-08 SURGICAL SUPPLY — 56 items
APL SKNCLS STERI-STRIP NONHPOA (GAUZE/BANDAGES/DRESSINGS) ×1
BENZOIN TINCTURE PRP APPL 2/3 (GAUZE/BANDAGES/DRESSINGS) ×2 IMPLANT
BLADE CLIPPER SURG (BLADE) IMPLANT
BLADE SAW SGTL 18X1.27X75 (BLADE) ×2 IMPLANT
CLSR STERI-STRIP ANTIMIC 1/2X4 (GAUZE/BANDAGES/DRESSINGS) ×1 IMPLANT
COVER SURGICAL LIGHT HANDLE (MISCELLANEOUS) ×2 IMPLANT
COVER WAND RF STERILE (DRAPES) ×2 IMPLANT
CUP SECTOR GRIPTON 50MM (Cup) ×1 IMPLANT
DRAPE C-ARM 42X72 X-RAY (DRAPES) ×2 IMPLANT
DRAPE STERI IOBAN 125X83 (DRAPES) ×2 IMPLANT
DRAPE U-SHAPE 47X51 STRL (DRAPES) ×6 IMPLANT
DRSG AQUACEL AG ADV 3.5X10 (GAUZE/BANDAGES/DRESSINGS) ×2 IMPLANT
DURAPREP 26ML APPLICATOR (WOUND CARE) ×2 IMPLANT
ELECT BLADE 4.0 EZ CLEAN MEGAD (MISCELLANEOUS) ×2
ELECT BLADE 6.5 EXT (BLADE) IMPLANT
ELECT REM PT RETURN 9FT ADLT (ELECTROSURGICAL) ×2
ELECTRODE BLDE 4.0 EZ CLN MEGD (MISCELLANEOUS) ×1 IMPLANT
ELECTRODE REM PT RTRN 9FT ADLT (ELECTROSURGICAL) ×1 IMPLANT
FACESHIELD WRAPAROUND (MASK) ×4 IMPLANT
FACESHIELD WRAPAROUND OR TEAM (MASK) ×2 IMPLANT
GLOVE BIOGEL PI IND STRL 8 (GLOVE) ×2 IMPLANT
GLOVE BIOGEL PI INDICATOR 8 (GLOVE) ×2
GLOVE ECLIPSE 8.0 STRL XLNG CF (GLOVE) ×2 IMPLANT
GLOVE ORTHO TXT STRL SZ7.5 (GLOVE) ×4 IMPLANT
GOWN STRL REUS W/ TWL LRG LVL3 (GOWN DISPOSABLE) ×2 IMPLANT
GOWN STRL REUS W/ TWL XL LVL3 (GOWN DISPOSABLE) ×2 IMPLANT
GOWN STRL REUS W/TWL LRG LVL3 (GOWN DISPOSABLE) ×4
GOWN STRL REUS W/TWL XL LVL3 (GOWN DISPOSABLE) ×4
HANDPIECE INTERPULSE COAX TIP (DISPOSABLE) ×2
HEAD FEMORAL 32 CERAMIC (Hips) ×1 IMPLANT
KIT BASIN OR (CUSTOM PROCEDURE TRAY) ×2 IMPLANT
KIT TURNOVER KIT B (KITS) ×2 IMPLANT
LINER ACETABULAR 32X50 (Liner) ×1 IMPLANT
MANIFOLD NEPTUNE II (INSTRUMENTS) ×2 IMPLANT
NS IRRIG 1000ML POUR BTL (IV SOLUTION) ×2 IMPLANT
PACK TOTAL JOINT (CUSTOM PROCEDURE TRAY) ×2 IMPLANT
PAD ARMBOARD 7.5X6 YLW CONV (MISCELLANEOUS) ×2 IMPLANT
SET HNDPC FAN SPRY TIP SCT (DISPOSABLE) ×1 IMPLANT
STAPLER VISISTAT 35W (STAPLE) IMPLANT
STEM CORAIL KA10 (Stem) ×1 IMPLANT
STRIP CLOSURE SKIN 1/2X4 (GAUZE/BANDAGES/DRESSINGS) ×4 IMPLANT
SUT ETHIBOND NAB CT1 #1 30IN (SUTURE) ×2 IMPLANT
SUT MNCRL AB 3-0 PS2 27 (SUTURE) ×1 IMPLANT
SUT MNCRL AB 4-0 PS2 18 (SUTURE) IMPLANT
SUT VIC AB 0 CT1 27 (SUTURE) ×2
SUT VIC AB 0 CT1 27XBRD ANBCTR (SUTURE) ×1 IMPLANT
SUT VIC AB 1 CT1 27 (SUTURE) ×2
SUT VIC AB 1 CT1 27XBRD ANBCTR (SUTURE) ×1 IMPLANT
SUT VIC AB 2-0 CT1 27 (SUTURE) ×2
SUT VIC AB 2-0 CT1 TAPERPNT 27 (SUTURE) ×1 IMPLANT
TOWEL GREEN STERILE (TOWEL DISPOSABLE) ×2 IMPLANT
TOWEL GREEN STERILE FF (TOWEL DISPOSABLE) ×2 IMPLANT
TRAY CATH 16FR W/PLASTIC CATH (SET/KITS/TRAYS/PACK) IMPLANT
TRAY FOLEY W/BAG SLVR 16FR (SET/KITS/TRAYS/PACK)
TRAY FOLEY W/BAG SLVR 16FR ST (SET/KITS/TRAYS/PACK) IMPLANT
WATER STERILE IRR 1000ML POUR (IV SOLUTION) ×4 IMPLANT

## 2019-09-08 NOTE — Anesthesia Procedure Notes (Signed)
Spinal  Patient location during procedure: OR Start time: 09/08/2019 2:18 PM End time: 09/08/2019 2:20 PM Staffing Performed: anesthesiologist  Anesthesiologist: Kaylyn Layer, MD Preanesthetic Checklist Completed: patient identified, IV checked, risks and benefits discussed, surgical consent, monitors and equipment checked, pre-op evaluation and timeout performed Spinal Block Patient position: sitting Prep: DuraPrep and site prepped and draped Patient monitoring: continuous pulse ox, blood pressure and heart rate Approach: midline Location: L3-4 Injection technique: single-shot Needle Needle type: Pencan  Needle gauge: 24 G Needle length: 9 cm Additional Notes Risks, benefits, and alternative discussed. Patient gave consent to procedure. Prepped and draped in sitting position. Patient sedated but responsive to voice. Clear CSF obtained after one needle pass. Positive terminal aspiration. No pain or paraesthesias with injection. Patient tolerated procedure well. Vital signs stable. Amalia Greenhouse, MD

## 2019-09-08 NOTE — Anesthesia Procedure Notes (Signed)
Procedure Name: MAC Date/Time: 09/08/2019 2:16 PM Performed by: Janene Harvey, CRNA Pre-anesthesia Checklist: Patient identified, Emergency Drugs available, Suction available and Patient being monitored Patient Re-evaluated:Patient Re-evaluated prior to induction Oxygen Delivery Method: Simple face mask Placement Confirmation: positive ETCO2 Dental Injury: Teeth and Oropharynx as per pre-operative assessment

## 2019-09-08 NOTE — Brief Op Note (Signed)
09/08/2019  3:35 PM  PATIENT:  Kathy Nguyen  54 y.o. female  PRE-OPERATIVE DIAGNOSIS:  osteoarthritis right hip  POST-OPERATIVE DIAGNOSIS:  osteoarthritis right hip  PROCEDURE:  Procedure(s): RIGHT TOTAL HIP ARTHROPLASTY ANTERIOR APPROACH (Right)  SURGEON:  Surgeon(s) and Role:    [name], MD - Primary  PHYSICIAN ASSISTANT:  Rexene Edison, PA-C  ANESTHESIA:   spinal  EBL:  150 mL   COUNTS:  YES  DICTATION: .Other Dictation: Dictation Number [id number]  PLAN OF CARE: Admit for overnight observation  PATIENT DISPOSITION:  PACU - hemodynamically stable.   Delay start of Pharmacological VTE agent (>24hrs) due to surgical blood loss or risk of bleeding: no

## 2019-09-08 NOTE — Transfer of Care (Signed)
Immediate Anesthesia Transfer of Care Note  Patient: Kathy Nguyen  Procedure(s) Performed: RIGHT TOTAL HIP ARTHROPLASTY ANTERIOR APPROACH (Right Hip)  Patient Location: PACU  Anesthesia Type:Spinal  Level of Consciousness: drowsy  Airway & Oxygen Therapy: Patient Spontanous Breathing and Patient connected to face mask oxygen  Post-op Assessment: Report given to RN and Post -op Vital signs reviewed and stable  Post vital signs: Reviewed  Last Vitals:  Vitals Value Taken Time  BP 107/72 09/08/19 1555  Temp    Pulse 78 09/08/19 1559  Resp 22 09/08/19 1559  SpO2 100 % 09/08/19 1559  Vitals shown include unvalidated device data.  Last Pain:  Vitals:   09/08/19 1236  TempSrc:   PainSc: 0-No pain         Complications: No apparent anesthesia complications

## 2019-09-08 NOTE — Progress Notes (Signed)
Orthopedic Tech Progress Note Patient Details:  Kathy Nguyen January 06, 1966 [id number]      Post Interventions Patient Tolerated: Well Instructions Provided: Poper ambulation with device   Autrey Human A Richards Pherigo 09/08/2019, 9:36 PM

## 2019-09-08 NOTE — Progress Notes (Signed)
Transferred from PACU, alert,orientd, dressing right hip in situ

## 2019-09-08 NOTE — Anesthesia Preprocedure Evaluation (Addendum)
Anesthesia Evaluation  Patient identified by MRN, date of birth, ID band Patient awake    Reviewed: Allergy & Precautions, NPO status , Patient's Chart, lab work & pertinent test results  History of Anesthesia Complications (+) PONV and history of anesthetic complications  Airway Mallampati: II  TM Distance: >3 FB Neck ROM: Full    Dental no notable dental hx.    Pulmonary neg pulmonary ROS,    Pulmonary exam normal        Cardiovascular hypertension, Pt. on medications Normal cardiovascular exam     Neuro/Psych Anxiety Depression negative neurological ROS     GI/Hepatic Neg liver ROS, hiatal hernia, GERD  Controlled,  Endo/Other  negative endocrine ROS  Renal/GU negative Renal ROS  negative genitourinary   Musculoskeletal  (+) Arthritis ,   Abdominal   Peds  Hematology negative hematology ROS (+)   Anesthesia Other Findings Day of surgery medications reviewed with patient.  Reproductive/Obstetrics negative OB ROS                            Anesthesia Physical Anesthesia Plan  ASA: II  Anesthesia Plan: Spinal   Post-op Pain Management:    Induction:   PONV Risk Score and Plan: 4 or greater and Midazolam, Treatment may vary due to age or medical condition, Ondansetron, Dexamethasone and Propofol infusion  Airway Management Planned: Natural Airway and Simple Face Mask  Additional Equipment: None  Intra-op Plan:   Post-operative Plan:   Informed Consent: I have reviewed the patients History and Physical, chart, labs and discussed the procedure including the risks, benefits and alternatives for the proposed anesthesia with the patient or authorized representative who has indicated his/her understanding and acceptance.       Plan Discussed with: CRNA  Anesthesia Plan Comments:       Anesthesia Quick Evaluation

## 2019-09-08 NOTE — H&P (Signed)
TOTAL HIP ADMISSION H&P  Patient is admitted for right total hip arthroplasty.  Subjective:  Chief Complaint: right hip pain  HPI: Kathy Nguyen, 54 y.o. female, has a history of pain and functional disability in the right hip(s) due to arthritis and patient has failed non-surgical conservative treatments for greater than 12 weeks to include NSAID's and/or analgesics, corticosteriod injections, flexibility and strengthening excercises and activity modification.  Onset of symptoms was abrupt starting 1 years ago with rapidlly worsening course since that time.The patient noted no past surgery on the right hip(s).  Patient currently rates pain in the right hip at 10 out of 10 with activity. Patient has night pain, worsening of pain with activity and weight bearing, pain that interfers with activities of daily living and pain with passive range of motion. Patient has evidence of subchondral edema in the femoral head and acetabulum by imaging studies. This condition presents safety issues increasing the risk of falls.  There is no current active infection.  Patient Active Problem List   Diagnosis Date Noted  . Unilateral primary osteoarthritis, right hip 08/17/2019   Past Medical History:  Diagnosis Date  . Anxiety   . Arthritis   . Depression   . Family history of adverse reaction to anesthesia    Father had a hard time awaking  . GERD (gastroesophageal reflux disease)   . Headache   . History of hiatal hernia   . Hypertension   . Pneumonia   . PONV (postoperative nausea and vomiting)     Past Surgical History:  Procedure Laterality Date  . ABDOMINAL HYSTERECTOMY  2008  . 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 BREAST SURGERY  2008   Reduction & Lift  . BREAST SURGERY     Implants  . VULVA / PERINEUM BIOPSY Right     Current Facility-Administered Medications  Medication Dose Route Frequency Provider Last Rate Last Admin  . ceFAZolin (ANCEF) 2-4  GM/100ML-% IVPB           . ceFAZolin (ANCEF) IVPB 2g/100 mL premix  2 g Intravenous On Call to OR Pete Pelt, PA-C      . lactated ringers infusion   Intravenous Continuous Brennan Bailey, MD 10 mL/hr at 09/08/19 1255 New Bag at 09/08/19 1255  . tranexamic acid (CYKLOKAPRON) 1000MG /116mL IVPB           . tranexamic acid (CYKLOKAPRON) IVPB 1,000 mg  1,000 mg Intravenous To OR Pete Pelt, PA-C       Allergies  Allergen Reactions  . Biaxin [Clarithromycin] Rash  . Penicillins Rash    Social History   Tobacco Use  . Smoking status: Never Smoker  . Smokeless tobacco: Never Used  Substance Use Topics  . Alcohol use: Not Currently    History reviewed. No pertinent family history.   Review of Systems  All other systems reviewed and are negative.   Objective:  Physical Exam  Constitutional: She is oriented to person, place, and time. She appears well-developed and well-nourished.  HENT:  Head: Normocephalic and atraumatic.  Eyes: Pupils are equal, round, and reactive to light. EOM are normal.  Cardiovascular: Normal rate and regular rhythm.  Respiratory: Effort normal and breath sounds normal.  GI: Soft. Bowel sounds are normal.  Musculoskeletal:     Cervical back: Normal range of motion and neck supple.     Right hip: Tenderness and bony tenderness present. Decreased range  of motion. Decreased strength.  Neurological: She is alert and oriented to person, place, and time.  Skin: Skin is warm and dry.  Psychiatric: She has a normal mood and affect.    Vital signs in last 24 hours: Temp:  [97.4 F (36.3 C)] 97.4 F (36.3 C) (06/01 1225) Pulse Rate:  [94] 94 (06/01 1225) Resp:  [18] 18 (06/01 1225) BP: (121)/(91) 121/91 (06/01 1225) SpO2:  [98 %] 98 % (06/01 1225) Weight:  [74.4 kg] 74.4 kg (06/01 1225)  Labs:   Estimated body mass index is 27.5 kg/m as calculated from the following:   Height as of this encounter: 5' 4.75" (1.645 m).   Weight as of this  encounter: 74.4 kg.   Imaging Review Plain radiographs demonstrate moderate degenerative joint disease of the right hip(s). The bone quality appears to be excellent for age and reported activity level.      Assessment/Plan:  End stage arthritis, right hip(s)  The patient history, physical examination, clinical judgement of the provider and imaging studies are consistent with end stage degenerative joint disease of the right hip(s) and total hip arthroplasty is deemed medically necessary. The treatment options including medical management, injection therapy, arthroscopy and arthroplasty were discussed at length. The risks and benefits of total hip arthroplasty were presented and reviewed. The risks due to aseptic loosening, infection, stiffness, dislocation/subluxation,  thromboembolic complications and other imponderables were discussed.  The patient acknowledged the explanation, agreed to proceed with the plan and consent was signed. Patient is being admitted for inpatient treatment for surgery, pain control, PT, OT, prophylactic antibiotics, VTE prophylaxis, progressive ambulation and ADL's and discharge planning.The patient is planning to be discharged home with home health services

## 2019-09-08 NOTE — Anesthesia Postprocedure Evaluation (Signed)
Anesthesia Post Note  Patient: Kathy Nguyen  Procedure(s) Performed: RIGHT TOTAL HIP ARTHROPLASTY ANTERIOR APPROACH (Right Hip)     Patient location during evaluation: Nursing Unit Anesthesia Type: Spinal Level of consciousness: oriented and awake and alert Pain management: pain level controlled Vital Signs Assessment: post-procedure vital signs reviewed and stable Respiratory status: spontaneous breathing and respiratory function stable Cardiovascular status: blood pressure returned to baseline and stable Postop Assessment: no headache, no backache, no apparent nausea or vomiting and patient able to bend at knees Anesthetic complications: no    Last Vitals:  Vitals:   09/08/19 1745 09/08/19 1815  BP: 114/76 122/81  Pulse: 60   Resp: 12   Temp:    SpO2: 100%     Last Pain:  Vitals:   09/08/19 1745  TempSrc:   PainSc: 6                  [name]

## 2019-09-08 NOTE — Op Note (Signed)
NAMERYLEE, HUESTIS MEDICAL RECORD QQ:[id number] ACCOUNT [id number] DATE OF BIRTH:25-Jan-1966 FACILITY: [facility] LOCATION: [facility] PHYSICIAN:Deone Leifheit Aretha Parrot, MD  OPERATIVE REPORT  DATE OF PROCEDURE:  09/08/2019  PREOPERATIVE DIAGNOSIS:  Primary osteoarthritis and degenerative joint disease, right hip.  POSTOPERATIVE DIAGNOSIS:  Primary osteoarthritis and degenerative joint disease, right hip.  PROCEDURE:  Right total hip arthroplasty through direct anterior approach.  IMPLANTS:  DePuy Sector Gription acetabular component size 50, size 32+0 neutral polyethylene liner, size 10 Corail femoral component with standard offset, size 32+1 ceramic hip ball.  SURGEON:  Vanita Panda. Magnus Ivan, MD  ASSISTANT:  Richardean Canal, PA-C.  ANESTHESIA:  Spinal.  ANTIBIOTICS:  Two g of IV Ancef.  ESTIMATED BLOOD LOSS:  150 mL.  COMPLICATIONS:  None.  INDICATIONS:  The patient is a 54 year old very active female well known to me.  She has debilitating right hip pain.  Her plain films did not show significant arthritis in her hip, so we did obtain an MRI after failure of conservative treatment.  The  MRI showed cystic changes in the acetabulum, as well as degenerative acetabular labral tear.  The radiologist said there was moderate to severe cartilage thinning of the femoral head.  Given her severe continued pain and the detrimental effect this is  having on her activities of daily living, her quality of life and her mobility, she did elect to proceed with a total hip arthroplasty.  We had a long and thorough discussion about the risk of acute blood loss anemia, nerve or vessel injury, fracture,  infection, dislocation, DVT and implant failure.  We talked about our goals being decreased pain, improved mobility and overall improved quality of life.  DESCRIPTION OF PROCEDURE:  After informed consent was obtained, the appropriate right hip was marked.  She was brought to the operating room and sat up  on the operating table where spinal anesthesia was obtained.  She was then laid in supine position on  a stretcher.  I assessed her leg lengths and found her to be just slightly short on the right comparing the right and left.  A Foley catheter was placed and both feet had traction boots applied to them.  Next, she was placed supine on the Hana fracture  table, with the perineal post in place and both legs in line skeletal traction device and no traction applied.  Her right operative hip was prepped and draped with DuraPrep and sterile drapes.  We assessed again radiographically as well.  A timeout was  called to identify correct patient and correct right hip.  We then made an incision just inferior and posterior to the anterior superior iliac spine.  We tried to keep this as small as we could.  We dissected down to the tensor fascia lata muscle.   Tensor fascia was then divided longitudinally to proceed with our direct anterior approach to the hip.  We identified and cauterized circumflex vessels and identified the hip capsule, opened up the hip capsule in an L-type format, finding no significant  joint effusion.  We placed Cobra retractors around the medial and lateral femoral neck and then made our femoral neck cut with oscillating saw just proximal to the lesser trochanter.  We completed this with an osteotome.  We then placed a corkscrew guide  in the femoral head and removed the femoral head in its entirety and found a small area devoid of cartilage.  We did see that there was a degenerative acetabular labral tear as well.  We then  removed remnants of the acetabular labrum and other debris.   I placed a bent Hohmann over the medial acetabular rim and then began reaming under direct visualization from a size 43 reamer in stepwise increments up to a size 49, with all reamers under direct visualization, the last reamer under direct fluoroscopy  so we could obtain our depth of reaming, our inclination and  anteversion.  I then placed the real DePuy Sector Gription acetabular component size 50 and a 32+0 neutral polyethylene liner for that size acetabular component.  Attention was then turned to  the femur.  With the leg externally rotated to 120 degrees, extended and adducted, we are to place a Mueller retractor medially and a Hohman retractor behind the greater trochanter.  We released lateral joint capsule and used a box-cutting osteotome to  enter the femoral canal and a rongeur to lateralize.  We then began broaching using the Corail broaching system from a size 8, going only up to a size 10.  With the size 10 in place, we trialed a standard offset femoral neck and a 32+1 hip ball, reduced  this in the acetabulum.  We were pleased with the range of motion and stability assessed radiographically and mechanically.  We then dislocated the hip and removed the trial components.  We then placed the real Corail femoral component size 10 with  standard offset and the real 32+1 ceramic hip ball and again reduced this in the acetabulum.  We were pleased with range of motion and stability assessed mechanically and radiographically.  We then irrigated the soft tissue with normal saline solution  using pulsatile lavage.  We were able to close the joint capsule with interrupted #1 Ethibond suture.  We closed the tensor fascia with #1 Vicryl, followed by 0 Vicryl to close the deep tissue, 2-0 Vicryl to close the subcutaneous tissue and 4-0 Monocryl  subcuticular stitch.  Steri-Strips were applied on the skin and a well-padded Aquacel dressing was placed.  The patient was taken off the Hana table and taken to the recovery room in stable condition.  All final counts were correct.  There were no  complications noted.  Of note, Benita Stabile, PA-C, assisted in the entire case.  His assistance was crucial for facilitating all aspects of this case.  VN/NUANCE  D:09/08/2019 T:09/08/2019 JOB:[id number]

## 2019-09-09 ENCOUNTER — Encounter: Payer: Self-pay | Admitting: *Deleted

## 2019-09-09 DIAGNOSIS — Z880 Allergy status to penicillin: Secondary | ICD-10-CM | POA: Diagnosis not present

## 2019-09-09 DIAGNOSIS — M25551 Pain in right hip: Secondary | ICD-10-CM | POA: Diagnosis present

## 2019-09-09 DIAGNOSIS — Z881 Allergy status to other antibiotic agents status: Secondary | ICD-10-CM | POA: Diagnosis not present

## 2019-09-09 DIAGNOSIS — K219 Gastro-esophageal reflux disease without esophagitis: Secondary | ICD-10-CM | POA: Diagnosis present

## 2019-09-09 DIAGNOSIS — F419 Anxiety disorder, unspecified: Secondary | ICD-10-CM | POA: Diagnosis present

## 2019-09-09 DIAGNOSIS — I10 Essential (primary) hypertension: Secondary | ICD-10-CM | POA: Diagnosis present

## 2019-09-09 DIAGNOSIS — Z90710 Acquired absence of both cervix and uterus: Secondary | ICD-10-CM | POA: Diagnosis not present

## 2019-09-09 DIAGNOSIS — F329 Major depressive disorder, single episode, unspecified: Secondary | ICD-10-CM | POA: Diagnosis present

## 2019-09-09 DIAGNOSIS — M1611 Unilateral primary osteoarthritis, right hip: Secondary | ICD-10-CM | POA: Diagnosis present

## 2019-09-09 NOTE — TOC Initial Note (Signed)
Transition of Care [facility]) - Initial/Assessment Note    Patient Details  Name: Kathy Nguyen MRN: [id number] Date of Birth: 02-17-1966  Transition of Care [facility]) CM/SW Contact:    Kingsley Plan, RN Phone Number: 09/09/2019, 11:04 AM  Clinical Narrative:                 Patient from home with boyfriend. Confirmed face sheet information. Prior to surgery patient brought a walker and 3 in1 .   Patient was already arranged with [facility], and agreeable to same. Rebecca Eaton with [facility] aware and accepted referral   Expected Discharge Plan: Home w Home Health Services Barriers to Discharge: Continued Medical Work up   Patient Goals and CMS Choice Patient states their goals for this hospitalization and ongoing recovery are:: to return to home CMS Medicare.gov Compare Post Acute Care list provided to:: Patient Choice offered to / list presented to : Patient  Expected Discharge Plan and Services Expected Discharge Plan: Home w Home Health Services   Discharge Planning Services: CM Consult Post Acute Care Choice: Home Health Living arrangements for the past 2 months: Single Family Home                 DME Arranged: N/A DME Agency: NA       HH Arranged: PT HH [facility]: [facility] Date HH Agency Contacted: 09/09/19 Time HH Agency Contacted: 1103 Representative spoke with at [facility] Agency: Rebecca Eaton  Prior Living Arrangements/Services Living arrangements for the past 2 months: Single Family Home Lives with:: Significant Other Patient language and need for interpreter reviewed:: Yes Do you feel safe going back to the place where you live?: Yes      Need for Family Participation in Patient Care: Yes (Comment) Care giver support system in place?: Yes (comment) Current home services: DME Criminal Activity/Legal Involvement Pertinent to Current Situation/Hospitalization: No - Comment as needed  Activities of Daily Living Home Assistive  Devices/Equipment: Eyeglasses ADL Screening (condition at time of admission) Patient's cognitive ability adequate to safely complete daily activities?: Yes Is the patient deaf or have difficulty hearing?: No Does the patient have difficulty seeing, even when wearing glasses/contacts?: No(reading glasses) Does the patient have difficulty concentrating, remembering, or making decisions?: No Patient able to express need for assistance with ADLs?: Yes Does the patient have difficulty dressing or bathing?: Yes Does the patient have difficulty walking or climbing stairs?: Yes Weakness of Legs: Right Weakness of Arms/Hands: None  Permission Sought/Granted   Permission granted to share information with : No              Emotional Assessment Appearance:: Appears stated age Attitude/Demeanor/Rapport: Engaged Affect (typically observed): Accepting Orientation: : Oriented to Self, Oriented to Place, Oriented to  Time, Oriented to Situation Alcohol / Substance Use: Not Applicable Psych Involvement: No (comment)  Admission diagnosis:  Status post total replacement of right hip [Z96.641] Patient Active Problem List   Diagnosis Date Noted  . Status post total replacement of right hip 09/08/2019  . Unilateral primary osteoarthritis, right hip 08/17/2019   PCP:  System, Pcp Not In Pharmacy:   [facility] - [place], [place] - [address] [place] [address] Phone: [phone] Fax: [phone]     Social Determinants of Health (SDOH) Interventions    Readmission Risk Interventions No flowsheet data found.

## 2019-09-09 NOTE — Evaluation (Addendum)
Physical Therapy Evaluation Patient Details Name: Kathy Nguyen MRN: [id number] DOB: Jan 29, 1966 Today's Date: 09/09/2019   History of Present Illness  Pt is a 54 y.o. F with no significant PMH who presents with right hip osteoarthritis s/p right total hip arthroplasty, direct anterior approach.   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 She will have support/assist from her boyfriend and daughter. On PT evaluation, pt presents with decreased functional mobility secondary to right hip pain and weakness. Ambulating 10 feet, then 5 feet with a walker at a min guard assist level. Limited in further distance by dizziness and nausea. RN notified. Pt is very motivated and suspect steady progress. Will need continued gait and stair training prior to discharge home.     Follow Up Recommendations Home health PT;Supervision for mobility/OOB    Equipment Recommendations  None recommended by PT (has needed DME)   Recommendations for Other Services       Precautions / Restrictions Precautions Precautions: Fall Restrictions Weight Bearing Restrictions: Yes RLE Weight Bearing: Weight bearing as tolerated      Mobility  Bed Mobility Overal bed mobility: Needs Assistance Bed Mobility: Supine to Sit     Supine to sit: Min assist     General bed mobility comments: MinA for RLE management. Increased time/effort  Transfers Overall transfer level: Needs assistance Equipment used: Rolling walker (2 wheeled) Transfers: Sit to/from Stand Sit to Stand: Min guard;Min assist         General transfer comment: Min guard from edge of bed, minA from toilet to boost up to stand  Ambulation/Gait Ambulation/Gait assistance: Min guard Gait Distance (Feet): 15 Feet(10", 5") Assistive device: Rolling walker (2 wheeled) Gait Pattern/deviations: Step-to pattern;Antalgic;Decreased stance time - right;Decreased weight shift to right Gait velocity: decreased Gait  velocity interpretation: <1.8 ft/sec, indicate of risk for recurrent falls General Gait Details: MIn guard for safety. Cues for sequencing, walker use/proximity, segmental turns rather than pivoting  Stairs            Wheelchair Mobility    Modified Rankin (Stroke Patients Only)       Balance Overall balance assessment: Needs assistance Sitting-balance support: Feet supported Sitting balance-Leahy Scale: Normal     Standing balance support: Bilateral upper extremity supported Standing balance-Leahy Scale: Fair                               Pertinent Vitals/Pain Pain Assessment: Faces Faces Pain Scale: Hurts even more Pain Location: R hip Pain Descriptors / Indicators: Grimacing;Guarding Pain Intervention(s): Limited activity within patient's tolerance;Monitored during session;Patient requesting pain meds-RN notified    Home Living Family/patient expects to be discharged to:: Private residence Living Arrangements: Spouse/significant other;Children;Non-relatives/Friends(boyfriend, daughter, friend) Available Help at Discharge: Family;Available PRN/intermittently Type of Home: House Home Access: Stairs to enter   Entrance Stairs-Number of Steps: 1(also has 2 steps in living room ) Home Layout: One level Home Equipment: Toilet riser;Walker - 4 wheels;Cane - single point;Grab bars - tub/shower      Prior Function Level of Independence: Independent         Comments: Works part time as Ambulance person, 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      Hand Dominance        Extremity/Trunk Assessment   Upper Extremity Assessment Upper Extremity Assessment: Overall WFL for tasks assessed    Lower Extremity Assessment Lower Extremity Assessment: RLE deficits/detail RLE Deficits / Details: s/p THA.  Unable to perform SLR, limited LAQ    Cervical / Trunk Assessment Cervical / Trunk Assessment: Normal  Communication   Communication: No difficulties  Cognition  Arousal/Alertness: Awake/alert Behavior During Therapy: WFL for tasks assessed/performed Overall Cognitive Status: Within Functional Limits for tasks assessed                                        General Comments      Exercises Total Joint Exercises Ankle Circles/Pumps: Right;10 reps;Supine Hip ABduction/ADduction: Right;5 reps;Seated Long Arc Quad: Right;5 reps;Seated   Assessment/Plan    PT Assessment Patient needs continued PT services  PT Problem List Decreased strength;Decreased activity tolerance;Decreased balance;Decreased mobility;Pain       PT Treatment Interventions DME instruction;Gait training;Stair training;Functional mobility training;Therapeutic activities;Therapeutic exercise;Balance training;Patient/family education    PT Goals (Current goals can be found in the Care Plan section)  Acute Rehab PT Goals Patient Stated Goal: "less pain." PT Goal Formulation: With patient Time For Goal Achievement: 09/23/19 Potential to Achieve Goals: Good    Frequency 7X/week   Barriers to discharge        Co-evaluation               AM-PAC PT "6 Clicks" Mobility  Outcome Measure Help needed turning from your back to your side while in a flat bed without using bedrails?: None Help needed moving from lying on your back to sitting on the side of a flat bed without using bedrails?: A Little Help needed moving to and from a bed to a chair (including a wheelchair)?: A Little Help needed standing up from a chair using your arms (e.g., wheelchair or bedside chair)?: A Little Help needed to walk in hospital room?: A Little Help needed climbing 3-5 steps with a railing? : A Lot 6 Click Score: 18    End of Session   Activity Tolerance: Patient limited by pain Patient left: in chair;with call bell/phone within reach Nurse Communication: Mobility status;Patient requests pain meds PT Visit Diagnosis: Pain;Difficulty in walking, not elsewhere classified  (R26.2) Pain - Right/Left: Right Pain - part of body: Hip    Time: 0813-0852 PT Time Calculation (min) (ACUTE ONLY): 39 min   Charges:   PT Evaluation $PT Eval Low Complexity: 1 Low PT Treatments $Gait Training: 8-22 mins $Therapeutic Activity: 8-22 mins          Wyona Almas, PT, DPT Acute Rehabilitation Services Pager [phone] Office [phone]   Deno Etienne 09/09/2019, 11:20 AM

## 2019-09-09 NOTE — Progress Notes (Signed)
Patient ID: Kathy Nguyen, female   DOB: 03-22-1966, 54 y.o.   MRN: [id number] Slower mobility secondary to pain.  Will need to keep today to maximize pain control and therapy.  Hopefully can discharge to home tomorrow.

## 2019-09-09 NOTE — Plan of Care (Signed)
  Problem: Education: Goal: Knowledge of General Education information will improve Description: Including pain rating scale, medication(s)/side effects and non-pharmacologic comfort measures Outcome: Progressing   Problem: Health Behavior/Discharge Planning: Goal: Ability to manage health-related needs will improve Outcome: Progressing   Problem: Clinical Measurements: Goal: Ability to maintain clinical measurements within normal limits will improve Outcome: Progressing Goal: Will remain free from infection Outcome: Progressing Goal: Respiratory complications will improve Outcome: Progressing   Problem: Activity: Goal: Risk for activity intolerance will decrease Outcome: Progressing   Problem: Nutrition: Goal: Adequate nutrition will be maintained Outcome: Progressing   Problem: Pain Managment: Goal: General experience of comfort will improve Outcome: Progressing   Problem: Safety: Goal: Ability to remain free from injury will improve Outcome: Progressing   Problem: Skin Integrity: Goal: Risk for impaired skin integrity will decrease Outcome: Progressing   

## 2019-09-09 NOTE — Progress Notes (Signed)
   09/09/19 1802  Assess: MEWS Score  Temp 98.6 F (37 C)  BP 129/74  Pulse Rate (!) 111  Resp 19  SpO2 99 %  O2 Device Room Air  Assess: MEWS Score  MEWS Temp 0  MEWS Systolic 0  MEWS Pulse 2  MEWS RR 0  MEWS LOC 0  MEWS Score 2  MEWS Score Color Yellow  Assess: if the MEWS score is Yellow or Red  Were vital signs taken at a resting state? Yes  Focused Assessment Documented focused assessment  Early Detection of Sepsis Score *See Row Information* Low  MEWS guidelines implemented *See Row Information* No, vital signs rechecked  Treat  MEWS Interventions Administered prn meds/treatments  Notify: Charge Nurse/RN  Name of Charge Nurse/RN Notified Shanon Brow, RN  Date Charge Nurse/RN Notified 09/09/19  Time Charge Nurse/RN Notified 1815  Document  Patient Outcome Stabilized after interventions  Progress note created (see row info) Yes

## 2019-09-09 NOTE — Progress Notes (Signed)
Physical Therapy Treatment Patient Details Name: Kathy Nguyen MRN: [id number] DOB: [birth year] Today's Date: 09/09/2019    History of Present Illness Pt is a 54 y.o. F with no significant PMH who presents with right hip osteoarthritis s/p right total hip arthroplasty, direct anterior approach.     PT Comments    Pt making slow but steady progress towards her physical therapy goals. Ambulating 40 feet with a walker, utilizing a step to pattern. Continued nausea with mobility, but no pallor or dizziness this session. Rest of session focused on bed level exercises for isometric strengthening. Will trial steps in AM to prepare for discharge home.     Follow Up Recommendations  Home health PT;Supervision for mobility/OOB     Equipment Recommendations  None recommended by PT    Recommendations for Other Services       Precautions / Restrictions Precautions Precautions: Fall Restrictions Weight Bearing Restrictions: Yes RLE Weight Bearing: Weight bearing as tolerated    Mobility  Bed Mobility Overal bed mobility: Needs Assistance Bed Mobility: Supine to Sit;Sit to Supine     Supine to sit: Min assist Sit to supine: Min assist   General bed mobility comments: MinA for RLE management. Increased time/effort  Transfers Overall transfer level: Needs assistance Equipment used: Rolling walker (2 wheeled) Transfers: Sit to/from Stand Sit to Stand: Min guard         General transfer comment: Min guard to steady from edge of bed  Ambulation/Gait Ambulation/Gait assistance: Min guard Gait Distance (Feet): 40 Feet Assistive device: Rolling walker (2 wheeled) Gait Pattern/deviations: Step-to pattern;Antalgic;Decreased stance time - right;Decreased weight shift to right Gait velocity: decreased Gait velocity interpretation: <1.8 ft/sec, indicate of risk for recurrent falls General Gait Details: Cues for sequencing, right heel strike at initial contact   Stairs              Wheelchair Mobility    Modified Rankin (Stroke Patients Only)       Balance Overall balance assessment: Needs assistance Sitting-balance support: Feet supported Sitting balance-Leahy Scale: Normal     Standing balance support: Bilateral upper extremity supported Standing balance-Leahy Scale: Fair                              Cognition Arousal/Alertness: Awake/alert Behavior During Therapy: WFL for tasks assessed/performed Overall Cognitive Status: Within Functional Limits for tasks assessed                                        Exercises Total Joint Exercises Quad Sets: Right;10 reps;Supine Gluteal Sets: Both;10 reps;Supine Hip ABduction/ADduction: Right;5 reps;Supine    General Comments        Pertinent Vitals/Pain Pain Assessment: Faces Faces Pain Scale: Hurts even more Pain Location: R hip Pain Descriptors / Indicators: Grimacing;Guarding Pain Intervention(s): Monitored during session;Limited activity within patient's tolerance    Home Living                      Prior Function            PT Goals (current goals can now be found in the care plan section) Acute Rehab PT Goals Patient Stated Goal: "less pain." PT Goal Formulation: With patient Time For Goal Achievement: 09/23/19 Potential to Achieve Goals: Good Progress towards PT goals: Progressing toward goals    Frequency  7X/week      PT Plan Current plan remains appropriate    Co-evaluation              AM-PAC PT "6 Clicks" Mobility   Outcome Measure  Help needed turning from your back to your side while in a flat bed without using bedrails?: None Help needed moving from lying on your back to sitting on the side of a flat bed without using bedrails?: A Little Help needed moving to and from a bed to a chair (including a wheelchair)?: A Little Help needed standing up from a chair using your arms (e.g., wheelchair or bedside chair)?: A  Little Help needed to walk in hospital room?: A Little Help needed climbing 3-5 steps with a railing? : A Lot 6 Click Score: 18    End of Session   Activity Tolerance: Patient tolerated treatment well Patient left: with call bell/phone within reach;in bed;with family/visitor present Nurse Communication: Mobility status;Patient requests pain meds PT Visit Diagnosis: Pain;Difficulty in walking, not elsewhere classified (R26.2) Pain - Right/Left: Right Pain - part of body: Hip     Time: 6226-3335 PT Time Calculation (min) (ACUTE ONLY): 30 min  Charges:  $Gait Training: 8-22 mins $Therapeutic Activity: 8-22 mins                       Wyona Almas, PT, DPT Acute Rehabilitation Services Pager [phone] Office [phone]    Deno Etienne 09/09/2019, 3:20 PM

## 2019-09-09 NOTE — Discharge Instructions (Signed)

## 2019-09-09 NOTE — Progress Notes (Signed)
Subjective: 1 Day Post-Op Procedure(s) (LRB): RIGHT TOTAL HIP ARTHROPLASTY ANTERIOR APPROACH (Right) Patient reports pain as moderate.  Had a tough evening with pain control.  Foley out this am.  Has eaten.  Objective: Vital signs in last 24 hours: Temp:  [97.4 F (36.3 C)-98.6 F (37 C)] 98.6 F (37 C) (06/02 0530) Pulse Rate:  [58-106] 102 (06/02 0530) Resp:  [10-24] 18 (06/02 0530) BP: (95-122)/(52-91) 103/65 (06/02 0530) SpO2:  [97 %-100 %] 97 % (06/02 0530) Weight:  [74.4 kg] 74.4 kg (06/01 1225)  Intake/Output from previous day: 06/01 0701 - 06/02 0700 In: 2669.3 [P.O.:540; I.V.:1829.3; IV Piggyback:300] Out: 1750 [Urine:1600; Blood:150] Intake/Output this shift: No intake/output data recorded.  No results for input(s): HGB in the last 72 hours. No results for input(s): WBC, RBC, HCT, PLT in the last 72 hours. No results for input(s): NA, K, CL, CO2, BUN, CREATININE, GLUCOSE, CALCIUM in the last 72 hours. No results for input(s): LABPT, INR in the last 72 hours.  Sensation intact distally Intact pulses distally Dorsiflexion/Plantar flexion intact Incision: dressing C/D/I   Assessment/Plan: 1 Day Post-Op Procedure(s) (LRB): RIGHT TOTAL HIP ARTHROPLASTY ANTERIOR APPROACH (Right) Up with therapy      [name] 09/09/2019, 8:08 AM

## 2019-09-10 ENCOUNTER — Telehealth: Payer: Self-pay | Admitting: Orthopaedic Surgery

## 2019-09-10 MED ORDER — OXYCODONE HCL 5 MG PO TABS: 5.0000 mg | ORAL_TABLET | ORAL | 0 refills | Status: DC | PRN

## 2019-09-10 MED ORDER — METHOCARBAMOL 500 MG PO TABS: 500.0000 mg | ORAL_TABLET | Freq: Four times a day (QID) | ORAL | 1 refills | Status: DC | PRN

## 2019-09-10 MED ORDER — ASPIRIN 81 MG PO CHEW: 81.0000 mg | CHEWABLE_TABLET | Freq: Two times a day (BID) | ORAL | 0 refills | Status: DC

## 2019-09-10 MED ORDER — KETOROLAC TROMETHAMINE 15 MG/ML IJ SOLN
15.0000 mg | Freq: Once | INTRAMUSCULAR | Status: AC
  Administered 2019-09-10: 15 mg via INTRAVENOUS
  Filled 2019-09-10: qty 1

## 2019-09-10 NOTE — Progress Notes (Signed)
Physical Therapy Treatment Patient Details Name: Kathy Nguyen MRN: [id number] DOB: 12-13-1965 Today's Date: 09/10/2019    History of Present Illness Pt is a 54 y.o. F with no significant PMH who presents with right hip osteoarthritis s/p right total hip arthroplasty, direct anterior approach.     PT Comments    Session focused on stair training prior to discharge home. Pt able to negotiate 2 steps with a walker at a min guard assist level. Written handout provided on technique. Post stair negotiation, pt with positive dizziness, nausea, and pallor. BP upon return to room 106/62, HR 122. Reporting right hip burning pain. RN notified.    Follow Up Recommendations  Home health PT;Supervision for mobility/OOB     Equipment Recommendations  None recommended by PT    Recommendations for Other Services       Precautions / Restrictions Precautions Precautions: Fall Restrictions Weight Bearing Restrictions: Yes RLE Weight Bearing: Weight bearing as tolerated    Mobility  Bed Mobility Overal bed mobility: Needs Assistance Bed Mobility: Supine to Sit     Supine to sit: Supervision     General bed mobility comments: Able to progress RLE off edge of bed with use of sheet  Transfers Overall transfer level: Needs assistance Equipment used: Rolling walker (2 wheeled) Transfers: Sit to/from Stand Sit to Stand: Min guard         General transfer comment: Increased time to rise  Ambulation/Gait Ambulation/Gait assistance: Min guard Gait Distance (Feet): 15 Feet(5", 5", 5") Assistive device: Rolling walker (2 wheeled) Gait Pattern/deviations: Step-to pattern;Antalgic;Decreased stance time - right;Decreased weight shift to right Gait velocity: decreased Gait velocity interpretation: <1.8 ft/sec, indicate of risk for recurrent falls General Gait Details: cues for upright posture   Stairs Stairs: Yes Stairs assistance: Min guard Stair Management: With  walker;Backwards;Forwards Number of Stairs: 2 General stair comments: Pt ascended steps with walker backwards, descended with walker forwards. PT stabilizing walker. Cues for sequencing.   Wheelchair Mobility    Modified Rankin (Stroke Patients Only)       Balance Overall balance assessment: Needs assistance Sitting-balance support: Feet supported Sitting balance-Leahy Scale: Normal     Standing balance support: Bilateral upper extremity supported Standing balance-Leahy Scale: Fair                              Cognition Arousal/Alertness: Awake/alert Behavior During Therapy: WFL for tasks assessed/performed Overall Cognitive Status: Within Functional Limits for tasks assessed                                        Exercises      General Comments        Pertinent Vitals/Pain Pain Assessment: Faces Faces Pain Scale: Hurts whole lot Pain Location: R hip Pain Descriptors / Indicators: Burning Pain Intervention(s): Limited activity within patient's tolerance;Monitored during session    Home Living                      Prior Function            PT Goals (current goals can now be found in the care plan section) Acute Rehab PT Goals Patient Stated Goal: "less pain." Potential to Achieve Goals: Good Progress towards PT goals: Progressing toward goals    Frequency    7X/week      PT  Plan Current plan remains appropriate    Co-evaluation              AM-PAC PT "6 Clicks" Mobility   Outcome Measure  Help needed turning from your back to your side while in a flat bed without using bedrails?: None Help needed moving from lying on your back to sitting on the side of a flat bed without using bedrails?: None Help needed moving to and from a bed to a chair (including a wheelchair)?: A Little Help needed standing up from a chair using your arms (e.g., wheelchair or bedside chair)?: A Little Help needed to walk in  hospital room?: A Little Help needed climbing 3-5 steps with a railing? : A Little 6 Click Score: 20    End of Session Equipment Utilized During Treatment: Gait belt Activity Tolerance: Other (comment)(limited by nausea) Patient left: in chair;with call bell/phone within reach Nurse Communication: Mobility status PT Visit Diagnosis: Pain;Difficulty in walking, not elsewhere classified (R26.2) Pain - Right/Left: Right Pain - part of body: Hip     Time: 4888-9169 PT Time Calculation (min) (ACUTE ONLY): 42 min  Charges:  $Gait Training: 23-37 mins $Therapeutic Activity: 8-22 mins                       Kathy Nguyen, PT, DPT Acute Rehabilitation Services Pager [phone] Office [phone]    Deno Etienne 09/10/2019, 3:07 PM

## 2019-09-10 NOTE — Progress Notes (Signed)
Patient ID: ARDYN FORGE, female   DOB: 19-Apr-1965, 54 y.o.   MRN: [id number] Due to continued pain from surgery and limited mobility, we will need to hold on the patient's discharge and keep her in the hospital until she has better pain control and better mobility with therapy to be able to be safely discharged to home.

## 2019-09-10 NOTE — Progress Notes (Signed)
Patient stated that she is concerned about going home due to increased pain when moving, patient stated that she does not feel safe or comfortable walking and getting in and out of the car. PT evaluated patient per MD order and does not feel that she is ready to go home or can tolerate mobility. Paged MD. Discharge order discontinued...  Will continue to monitor patient.

## 2019-09-10 NOTE — Progress Notes (Addendum)
Physical Therapy Treatment Patient Details Name: Kathy Nguyen MRN: [id number] DOB: 1965/07/11 Today's Date: 09/10/2019    History of Present Illness Pt is a 54 y.o. F with no significant PMH who presents with right hip osteoarthritis s/p right total hip arthroplasty, direct anterior approach.     PT Comments    Pt with uncontrolled, burning R hip pain despite pre-medication. Ambulating limited room distances with a walker. Not requiring significant physical assist; main limitation is pain. Encouraged progressive mobility, ice, elevation. Pt states she does not feel she is ready to d/c home. RN/MD notified.    Follow Up Recommendations  Home health PT;Supervision for mobility/OOB     Equipment Recommendations  None recommended by PT    Recommendations for Other Services       Precautions / Restrictions Precautions Precautions: Fall Restrictions Weight Bearing Restrictions: Yes RLE Weight Bearing: Weight bearing as tolerated    Mobility  Bed Mobility Overal bed mobility: Needs Assistance Bed Mobility: Supine to Sit     Supine to sit: Supervision     General bed mobility comments: Able to progress RLE off edge of bed with use of sheet  Transfers Overall transfer level: Needs assistance Equipment used: Rolling walker (2 wheeled) Transfers: Sit to/from Stand Sit to Stand: Min guard         General transfer comment: Increased time to rise  Ambulation/Gait Ambulation/Gait assistance: Min guard Gait Distance (Feet): 25 Feet Assistive device: Rolling walker (2 wheeled) Gait Pattern/deviations: Step-to pattern;Antalgic;Decreased stance time - right;Decreased weight shift to right Gait velocity: decreased Gait velocity interpretation: <1.8 ft/sec, indicate of risk for recurrent falls General Gait Details: cues for upright posture   Stairs   Wheelchair Mobility    Modified Rankin (Stroke Patients Only)       Balance Overall balance assessment: Needs  assistance Sitting-balance support: Feet supported Sitting balance-Leahy Scale: Normal     Standing balance support: Bilateral upper extremity supported Standing balance-Leahy Scale: Fair                              Cognition Arousal/Alertness: Awake/alert Behavior During Therapy: WFL for tasks assessed/performed Overall Cognitive Status: Within Functional Limits for tasks assessed                                        Exercises      General Comments        Pertinent Vitals/Pain Pain Assessment: Faces Faces Pain Scale: Hurts worst Pain Location: R hip Pain Descriptors / Indicators: Burning Pain Intervention(s): Limited activity within patient's tolerance;Monitored during session;Premedicated before session    Home Living                      Prior Function            PT Goals (current goals can now be found in the care plan section) Acute Rehab PT Goals Patient Stated Goal: "less pain." Potential to Achieve Goals: Good Progress towards PT goals: Not progressing toward goals - comment(pain)    Frequency    7X/week      PT Plan Current plan remains appropriate    Co-evaluation              AM-PAC PT "6 Clicks" Mobility   Outcome Measure  Help needed turning from your back to your side  while in a flat bed without using bedrails?: None Help needed moving from lying on your back to sitting on the side of a flat bed without using bedrails?: None Help needed moving to and from a bed to a chair (including a wheelchair)?: A Little Help needed standing up from a chair using your arms (e.g., wheelchair or bedside chair)?: A Little Help needed to walk in hospital room?: A Little Help needed climbing 3-5 steps with a railing? : A Little 6 Click Score: 20    End of Session Equipment Utilized During Treatment: Gait belt Activity Tolerance: Patient limited by pain Patient left: with call bell/phone within reach;in  bed;with family/visitor present Nurse Communication: Mobility status PT Visit Diagnosis: Pain;Difficulty in walking, not elsewhere classified (R26.2) Pain - Right/Left: Right Pain - part of body: Hip     Time: 7619-5093 PT Time Calculation (min) (ACUTE ONLY): 22 min  Charges:  $Gait Training: 8-22 mins                        Wyona Almas, PT, DPT [place] Pager [phone] Office [phone]    Deno Etienne 09/10/2019, 5:27 PM

## 2019-09-10 NOTE — Progress Notes (Signed)
   09/10/19 1601  Assess: MEWS Score  Temp (!) 101.4 F (38.6 C)  BP 101/61  Pulse Rate (!) 116  Resp 17  Level of Consciousness Alert  SpO2 94 %  O2 Device Room Air  Assess: MEWS Score  MEWS Temp 1  MEWS Systolic 0  MEWS Pulse 2  MEWS RR 0  MEWS LOC 0  MEWS Score 3  MEWS Score Color Yellow  Assess: if the MEWS score is Yellow or Red  Were vital signs taken at a resting state? Yes  Focused Assessment Documented focused assessment  Early Detection of Sepsis Score *See Row Information* Low  MEWS guidelines implemented *See Row Information* Yes  Treat  MEWS Interventions Administered prn meds/treatments  Take Vital Signs  Increase Vital Sign Frequency  Yellow: Q 2hr X 2 then Q 4hr X 2, if remains yellow, continue Q 4hrs  Escalate  MEWS: Escalate Yellow: discuss with charge nurse/RN and consider discussing with provider and RRT  Notify: Charge Nurse/RN  Name of Charge Nurse/RN Notified Zee, RN  Date Charge Nurse/RN Notified 09/10/19  Time Charge Nurse/RN Notified 1605  Document  Progress note created (see row info) Yes   Yellow MEWS protocol initiated due to elevated temperature of 101.4. PRN Tylenol given. Charge nurse notified. Will continue to monitor pt.

## 2019-09-10 NOTE — Progress Notes (Signed)
Subjective: 2 Days Post-Op Procedure(s) (LRB): RIGHT TOTAL HIP ARTHROPLASTY ANTERIOR APPROACH (Right) Patient alert and appears comfortable.   Objective: Vital signs in last 24 hours: Temp:  [98.4 F (36.9 C)-100.1 F (37.8 C)] 99.8 F (37.7 C) (06/03 0407) Pulse Rate:  [97-111] 109 (06/03 0407) Resp:  [16-20] 17 (06/03 0407) BP: (112-129)/(68-77) 112/74 (06/03 0407) SpO2:  [97 %-100 %] 98 % (06/03 0407)  Intake/Output from previous day: 06/02 0701 - 06/03 0700 In: 1644.6 [P.O.:1070; I.V.:574.6] Out: -  Intake/Output this shift: Total I/O In: 180 [P.O.:180] Out: -   No results for input(s): HGB in the last 72 hours. No results for input(s): WBC, RBC, HCT, PLT in the last 72 hours. No results for input(s): NA, K, CL, CO2, BUN, CREATININE, GLUCOSE, CALCIUM in the last 72 hours. No results for input(s): LABPT, INR in the last 72 hours.  Incision: dressing C/D/I Compartment soft   Assessment/Plan: 2 Days Post-Op Procedure(s) (LRB): RIGHT TOTAL HIP ARTHROPLASTY ANTERIOR APPROACH (Right) Up with therapy Discharge home with home health Discharge home after afternoon PT.      Alvester Eads 09/10/2019, 9:07 AM

## 2019-09-10 NOTE — Telephone Encounter (Signed)
Nyshe from [facility]  The PT doesn't agree with the discharge date given to the patient.   Call back: [phone]

## 2019-09-10 NOTE — Discharge Summary (Signed)
Patient ID: Kathy Nguyen MRN: [id number] DOB/AGE: July 22, 1965 54 y.o.  Admit date: 09/08/2019 Discharge date: 09/10/2019  Admission Diagnoses:  Principal Problem:   Unilateral primary osteoarthritis, right hip Active Problems:   Status post total replacement of right hip   Discharge Diagnoses:  Same  Past Medical History:  Diagnosis Date  . Anxiety   . Arthritis   . Depression   . Family history of adverse reaction to anesthesia    Father had a hard time awaking  . GERD (gastroesophageal reflux disease)   . Headache   . History of hiatal hernia   . Hypertension   . Pneumonia   . PONV (postoperative nausea and vomiting)     Surgeries: Procedure(s): RIGHT TOTAL HIP ARTHROPLASTY ANTERIOR APPROACH on 09/08/2019   Consultants:   Discharged Condition: Improved  Hospital Course: Kathy Nguyen is an 54 y.o. female who was admitted 09/08/2019 for operative treatment ofUnilateral primary osteoarthritis, right hip. Patient has severe unremitting pain that affects sleep, daily activities, and work/hobbies. After pre-op clearance the patient was taken to the operating room on 09/08/2019 and underwent  Procedure(s): RIGHT TOTAL HIP ARTHROPLASTY ANTERIOR APPROACH.    Patient was given perioperative antibiotics:  Anti-infectives (From admission, onward)   Start     Dose/Rate Route Frequency Ordered Stop   09/09/19 0500  valACYclovir (VALTREX) tablet 500 mg     500 mg Oral Daily 09/08/19 1922     09/08/19 2100  ceFAZolin (ANCEF) IVPB 1 g/50 mL premix     1 g 100 mL/hr over 30 Minutes Intravenous Every 6 hours 09/08/19 1922 09/09/19 0342   09/08/19 1230  ceFAZolin (ANCEF) IVPB 2g/100 mL premix     2 g 200 mL/hr over 30 Minutes Intravenous On call to O.R. 09/08/19 1208 09/08/19 1425   09/08/19 1217  ceFAZolin (ANCEF) 2-4 GM/100ML-% IVPB    Note to Pharmacy: Ernie Avena   : cabinet override      09/08/19 1217 09/08/19 1433       Patient was given sequential compression devices, early  ambulation, and chemoprophylaxis to prevent DVT.  Patient benefited maximally from hospital stay and there were no complications.    Recent vital signs:  Patient Vitals for the past 24 hrs:  BP Temp Temp src Pulse Resp SpO2  09/10/19 0407 112/74 99.8 F (37.7 C) Oral (!) 109 17 98 %  09/09/19 2037 123/71 100.1 F (37.8 C) Oral (!) 110 20 100 %  09/09/19 1921 124/77 98.4 F (36.9 C) Oral (!) 109 19 100 %  09/09/19 1802 129/74 98.6 F (37 C) Oral (!) 111 19 99 %  09/09/19 1423 117/77 98.7 F (37.1 C) Oral 100 17 99 %  09/09/19 1049 113/68 98.5 F (36.9 C) Oral 97 16 97 %     Recent laboratory studies: No results for input(s): WBC, HGB, HCT, PLT, NA, K, CL, CO2, BUN, CREATININE, GLUCOSE, INR, CALCIUM in the last 72 hours.  Invalid input(s): PT, 2   Discharge Medications:   Allergies as of 09/10/2019      Reactions   Biaxin [clarithromycin] Rash   Penicillins Rash      Medication List    STOP taking these medications   cyclobenzaprine 10 MG tablet Commonly known as: FLEXERIL     TAKE these medications   ALPRAZolam 0.25 MG tablet Commonly known as: XANAX Take 0.25 mg by mouth 2 (two) times daily as needed for anxiety.   aspirin 81 MG chewable tablet Chew 1 tablet (81  mg total) by mouth 2 (two) times daily.   buPROPion 300 MG 24 hr tablet Commonly known as: WELLBUTRIN XL Take 300 mg by mouth daily.   dicyclomine 10 MG capsule Commonly known as: BENTYL Take 10 mg by mouth 3 (three) times daily as needed for spasms.   estradiol 1 MG tablet Commonly known as: ESTRACE Take 1 mg by mouth daily.   loratadine 10 MG tablet Commonly known as: CLARITIN Take 10 mg by mouth daily as needed for allergies.   methocarbamol 500 MG tablet Commonly known as: ROBAXIN Take 1 tablet (500 mg total) by mouth every 6 (six) hours as needed for muscle spasms.   olmesartan-hydrochlorothiazide 20-12.5 MG tablet Commonly known as: BENICAR HCT Take 1 tablet by mouth every morning.    ondansetron 4 MG tablet Commonly known as: ZOFRAN Take 4 mg by mouth as needed for nausea or vomiting.   oxyCODONE 5 MG immediate release tablet Commonly known as: Oxy IR/ROXICODONE Take 1-2 tablets (5-10 mg total) by mouth every 4 (four) hours as needed for moderate pain (pain score 4-6).   polyethylene glycol 17 g packet Commonly known as: MIRALAX / GLYCOLAX Take 17 g by mouth daily as needed for moderate constipation.   TURMERIC CURCUMIN PO Take 1 capsule by mouth daily.   valACYclovir 500 MG tablet Commonly known as: VALTREX Take 500 mg by mouth daily as needed (outbreak).            Durable Medical Equipment  (From admission, onward)         Start     Ordered   09/08/19 1923  DME 3 n 1  Once     09/08/19 1922   09/08/19 1923  DME Walker rolling  Once    Question Answer Comment  Walker: With 5 Inch Wheels   Patient needs a walker to treat with the following condition Status post total replacement of right hip      09/08/19 1922          Diagnostic Studies: DG Pelvis Portable  Result Date: 09/08/2019 CLINICAL DATA:  54 year old female status post total right hip arthroplasty. EXAM: PORTABLE PELVIS 1-2 VIEWS COMPARISON:  Earlier fluoroscopic study dated 09/08/2019. FINDINGS: Status post total right hip arthroplasty. The arthroplasty components appear intact and in anatomic alignment. There is no acute fracture or dislocation. Postsurgical changes of the soft tissues of the right hip. IMPRESSION: Status post total right hip arthroplasty. Electronically Signed   By: Elgie Collard M.D.   On: 09/08/2019 16:10   MR Hip Right w/o contrast  Result Date: 08/12/2019 CLINICAL DATA:  Right hip and leg pain since October, 2020. No known injury. EXAM: MR OF THE RIGHT HIP WITHOUT CONTRAST TECHNIQUE: Multiplanar, multisequence MR imaging was performed. No intravenous contrast was administered. COMPARISON:  None. FINDINGS: Bones: There is some subchondral cyst formation and  edema in the right acetabulum. Largest subchondral cyst in the acetabulum measures 1 cm in diameter. Bone marrow signal is otherwise normal without fracture, stress change or worrisome lesion. No subchondral cyst formation or edema about the left hip. No avascular necrosis of the femoral heads. Articular cartilage and labrum Articular cartilage: Cartilage of the left hip is degenerated and thinned without focal defect. Labrum: Degenerative tearing is seen in the anterior and superior right labrum. Joint or bursal effusion Joint effusion:  None. Bursae: A small amount of fluid is seen in the right trochanteric bursa. Muscles and tendons Muscles and tendons: Intrasubstance increased T2 signal and thickening are  seen the right gluteus minimus tendon consistent with tendinosis without tear. Other findings Miscellaneous: Imaged intrapelvic contents demonstrate no acute focal abnormality. IMPRESSION: Moderately severe appearing right hip osteoarthritis with associated degenerative tearing of the anterior and superior right labrum. Right trochanteric bursitis with secondary right gluteus minimus tendinosis without tear. Electronically Signed   By: Inge Rise M.D.   On: 08/12/2019 11:49   DG C-Arm 1-60 Min  Result Date: 09/08/2019 CLINICAL DATA:  54 year old female right hip arthroplasty anterior approach. EXAM: DG C-ARM 1-60 MIN; OPERATIVE RIGHT HIP WITH PELVIS FLUOROSCOPY TIME:  Fluoroscopy Time:  0 minutes 21 seconds Radiation Exposure Index (if provided by the fluoroscopic device): 2.3 mGy Number of Acquired Spot Images: 0 COMPARISON:  Hip MRI 08/12/2019. FINDINGS: 4 intraoperative fluoroscopic spot views of the lower pelvis and right hip. Right hip arthroplasty hardware in place on the final three views with evidence of normal alignment. No unexpected osseous changes. IMPRESSION: Intraoperative images of right hip arthroplasty with no adverse features. Electronically Signed   By: Genevie Ann M.D.   On: 09/08/2019  16:58   DG HIP OPERATIVE UNILAT W OR W/O PELVIS RIGHT  Result Date: 09/08/2019 CLINICAL DATA:  54 year old female right hip arthroplasty anterior approach. EXAM: DG C-ARM 1-60 MIN; OPERATIVE RIGHT HIP WITH PELVIS FLUOROSCOPY TIME:  Fluoroscopy Time:  0 minutes 21 seconds Radiation Exposure Index (if provided by the fluoroscopic device): 2.3 mGy Number of Acquired Spot Images: 0 COMPARISON:  Hip MRI 08/12/2019. FINDINGS: 4 intraoperative fluoroscopic spot views of the lower pelvis and right hip. Right hip arthroplasty hardware in place on the final three views with evidence of normal alignment. No unexpected osseous changes. IMPRESSION: Intraoperative images of right hip arthroplasty with no adverse features. Electronically Signed   By: Genevie Ann M.D.   On: 09/08/2019 16:58    Disposition: Discharge disposition: 01-Home or [place]    Mcarthur Rossetti, MD Follow up in 2 week(s).   Specialty: Orthopedic Surgery Contact information: [address] [place] [address] [phone]            Signed: Erskine Emery 09/10/2019, 9:06 AM

## 2019-09-10 NOTE — Plan of Care (Signed)

## 2019-09-11 MED ORDER — GABAPENTIN 100 MG PO CAPS: 100.0000 mg | ORAL_CAPSULE | Freq: Three times a day (TID) | ORAL | 1 refills | Status: DC

## 2019-09-11 MED ORDER — ONDANSETRON 4 MG PO TBDP
4.0000 mg | ORAL_TABLET | Freq: Three times a day (TID) | ORAL | 0 refills | Status: DC | PRN
Start: 2019-09-11 — End: 2022-07-12

## 2019-09-11 NOTE — Progress Notes (Signed)
Physical Therapy Treatment Patient Details Name: Kathy Nguyen MRN: [id number] DOB: October 25, 1965 Today's Date: 09/11/2019    History of Present Illness Pt is a 54 y.o. F with no significant PMH who presents with right hip osteoarthritis s/p right total hip arthroplasty, direct anterior approach.     PT Comments    Pt with improved pain and activity tolerance this afternoon. Still with mild nausea. Ambulating 60 feet with a walker; intermittently utilizing a step through pattern. Education reinforced regarding activity recommendations and progression upon discharge home.    Follow Up Recommendations  Home health PT;Supervision for mobility/OOB     Equipment Recommendations  None recommended by PT    Recommendations for Other Services       Precautions / Restrictions Precautions Precautions: Fall Restrictions Weight Bearing Restrictions: Yes RLE Weight Bearing: Weight bearing as tolerated    Mobility  Bed Mobility Overal bed mobility: Modified Independent             General bed mobility comments: HOB elevated, able to progress to edge of bed without physical assist  Transfers Overall transfer level: Needs assistance Equipment used: Rolling walker (2 wheeled) Transfers: Sit to/from Stand Sit to Stand: Supervision         General transfer comment: Cues for placing right foot anteriorly for pain control  Ambulation/Gait Ambulation/Gait assistance: Min guard Gait Distance (Feet): 60 Feet Assistive device: Rolling walker (2 wheeled) Gait Pattern/deviations: Step-to pattern;Antalgic;Decreased stance time - right;Decreased weight shift to right;Step-through pattern Gait velocity: decreased   General Gait Details: Cues for right heel strike at initial contact, equal step lengths, rolling walker rather than picking it up, step through pattern. Pt with heavy reliance through arms on walker, fatigues quickly   Stairs             Wheelchair Mobility    Modified  Rankin (Stroke Patients Only)       Balance Overall balance assessment: Needs assistance Sitting-balance support: Feet supported Sitting balance-Leahy Scale: Normal     Standing balance support: No upper extremity supported;During functional activity Standing balance-Leahy Scale: Good                              Cognition Arousal/Alertness: Awake/alert Behavior During Therapy: WFL for tasks assessed/performed Overall Cognitive Status: Within Functional Limits for tasks assessed                                        Exercises      General Comments        Pertinent Vitals/Pain Pain Assessment: Faces Faces Pain Scale: Hurts even more Pain Location: R hip Pain Descriptors / Indicators: Burning Pain Intervention(s): Monitored during session;Limited activity within patient's tolerance    Home Living                      Prior Function            PT Goals (current goals can now be found in the care plan section) Acute Rehab PT Goals Patient Stated Goal: "less pain." Potential to Achieve Goals: Good Progress towards PT goals: Progressing toward goals    Frequency    7X/week      PT Plan Current plan remains appropriate    Co-evaluation              AM-PAC PT "  6 Clicks" Mobility   Outcome Measure  Help needed turning from your back to your side while in a flat bed without using bedrails?: None Help needed moving from lying on your back to sitting on the side of a flat bed without using bedrails?: None Help needed moving to and from a bed to a chair (including a wheelchair)?: None Help needed standing up from a chair using your arms (e.g., wheelchair or bedside chair)?: None Help needed to walk in hospital room?: A Little Help needed climbing 3-5 steps with a railing? : A Little 6 Click Score: 22    End of Session Equipment Utilized During Treatment: Gait belt Activity Tolerance: Patient tolerated treatment  well Patient left: with call bell/phone within reach;in bed;with family/visitor present Nurse Communication: Mobility status PT Visit Diagnosis: Pain;Difficulty in walking, not elsewhere classified (R26.2) Pain - Right/Left: Right Pain - part of body: Hip     Time: 1980-2217 PT Time Calculation (min) (ACUTE ONLY): 26 min  Charges:  $Gait Training: 23-37 mins                       Lillia Pauls, PT, DPT Acute Rehabilitation Services Pager [phone] Office [phone]    Norval Morton 09/11/2019, 4:54 PM

## 2019-09-11 NOTE — Progress Notes (Signed)
Patient ID: Kathy Nguyen, female   DOB: 1966/03/26, 54 y.o.   MRN: [id number] I have not seen the patient this morning.  Her vital signs are stable.  If she does well with therapy today and her pain is under better control today, she can potentially go home this afternoon.

## 2019-09-11 NOTE — Plan of Care (Signed)

## 2019-09-11 NOTE — Discharge Summary (Signed)
Patient ID: KALIN AMRHEIN MRN: [id number] DOB/AGE: 54-Jul-1967 54 y.o.  Admit date: 09/08/2019 Discharge date: 09/11/2019  Admission Diagnoses:  Principal Problem:   Unilateral primary osteoarthritis, right hip Active Problems:   Status post total replacement of right hip   Discharge Diagnoses:  Same  Past Medical History:  Diagnosis Date  . Anxiety   . Arthritis   . Depression   . Family history of adverse reaction to anesthesia    Father had a hard time awaking  . GERD (gastroesophageal reflux disease)   . Headache   . History of hiatal hernia   . Hypertension   . Pneumonia   . PONV (postoperative nausea and vomiting)     Surgeries: Procedure(s): RIGHT TOTAL HIP ARTHROPLASTY ANTERIOR APPROACH on 09/08/2019   Consultants:   Discharged Condition: Improved  Hospital Course: MAKENZIE WEISNER is an 54 y.o. female who was admitted 09/08/2019 for operative treatment ofUnilateral primary osteoarthritis, right hip. Patient has severe unremitting pain that affects sleep, daily activities, and work/hobbies. After pre-op clearance the patient was taken to the operating room on 09/08/2019 and underwent  Procedure(s): RIGHT TOTAL HIP ARTHROPLASTY ANTERIOR APPROACH.    Patient was given perioperative antibiotics:  Anti-infectives (From admission, onward)   Start     Dose/Rate Route Frequency Ordered Stop   09/09/19 0500  valACYclovir (VALTREX) tablet 500 mg     500 mg Oral Daily 09/08/19 1922     09/08/19 2100  ceFAZolin (ANCEF) IVPB 1 g/50 mL premix     1 g 100 mL/hr over 30 Minutes Intravenous Every 6 hours 09/08/19 1922 09/09/19 0342   09/08/19 1230  ceFAZolin (ANCEF) IVPB 2g/100 mL premix     2 g 200 mL/hr over 30 Minutes Intravenous On call to O.R. 09/08/19 1208 09/08/19 1425   09/08/19 1217  ceFAZolin (ANCEF) 2-4 GM/100ML-% IVPB    Note to Pharmacy: Ernie Avena   : cabinet override      09/08/19 1217 09/08/19 1433       Patient was given sequential compression devices, early  ambulation, and chemoprophylaxis to prevent DVT.  Patient benefited maximally from hospital stay and there were no complications.    Recent vital signs:  Patient Vitals for the past 24 hrs:  BP Temp Temp src Pulse Resp SpO2  09/11/19 0530 101/61 98.6 F (37 C) Oral (!) 103 18 97 %  09/11/19 0404 96/64 100 F (37.8 C) Oral (!) 101 17 99 %  09/11/19 0001 90/63 98.7 F (37.1 C) Oral (!) 104 17 97 %  09/10/19 2026 101/65 99.4 F (37.4 C) Oral (!) 105 18 97 %  09/10/19 1810 (!) 82/59 98.2 F (36.8 C) Oral (!) 105 16 --  09/10/19 1601 101/61 (!) 101.4 F (38.6 C) Oral (!) 116 17 94 %     Recent laboratory studies: No results for input(s): WBC, HGB, HCT, PLT, NA, K, CL, CO2, BUN, CREATININE, GLUCOSE, INR, CALCIUM in the last 72 hours.  Invalid input(s): PT, 2   Discharge Medications:   Allergies as of 09/11/2019      Reactions   Biaxin [clarithromycin] Rash   Penicillins Rash      Medication List    STOP taking these medications   cyclobenzaprine 10 MG tablet Commonly known as: FLEXERIL     TAKE these medications   ALPRAZolam 0.25 MG tablet Commonly known as: XANAX Take 0.25 mg by mouth 2 (two) times daily as needed for anxiety.   aspirin 81 MG chewable tablet Chew  1 tablet (81 mg total) by mouth 2 (two) times daily.   buPROPion 300 MG 24 hr tablet Commonly known as: WELLBUTRIN XL Take 300 mg by mouth daily.   dicyclomine 10 MG capsule Commonly known as: BENTYL Take 10 mg by mouth 3 (three) times daily as needed for spasms.   estradiol 1 MG tablet Commonly known as: ESTRACE Take 1 mg by mouth daily.   gabapentin 100 MG capsule Commonly known as: NEURONTIN Take 1 capsule (100 mg total) by mouth 3 (three) times daily.   loratadine 10 MG tablet Commonly known as: CLARITIN Take 10 mg by mouth daily as needed for allergies.   methocarbamol 500 MG tablet Commonly known as: ROBAXIN Take 1 tablet (500 mg total) by mouth every 6 (six) hours as needed for muscle  spasms.   olmesartan-hydrochlorothiazide 20-12.5 MG tablet Commonly known as: BENICAR HCT Take 1 tablet by mouth every morning.   ondansetron 4 MG disintegrating tablet Commonly known as: Zofran ODT Take 1 tablet (4 mg total) by mouth every 8 (eight) hours as needed for nausea or vomiting.   ondansetron 4 MG tablet Commonly known as: ZOFRAN Take 4 mg by mouth as needed for nausea or vomiting.   oxyCODONE 5 MG immediate release tablet Commonly known as: Oxy IR/ROXICODONE Take 1-2 tablets (5-10 mg total) by mouth every 4 (four) hours as needed for moderate pain (pain score 4-6).   polyethylene glycol 17 g packet Commonly known as: MIRALAX / GLYCOLAX Take 17 g by mouth daily as needed for moderate constipation.   TURMERIC CURCUMIN PO Take 1 capsule by mouth daily.   valACYclovir 500 MG tablet Commonly known as: VALTREX Take 500 mg by mouth daily as needed (outbreak).            Durable Medical Equipment  (From admission, onward)         Start     Ordered   09/08/19 1923  DME 3 n 1  Once     09/08/19 1922   09/08/19 1923  DME Walker rolling  Once    Question Answer Comment  Walker: With 5 Inch Wheels   Patient needs a walker to treat with the following condition Status post total replacement of right hip      09/08/19 1922          Diagnostic Studies: DG Pelvis Portable  Result Date: 09/08/2019 CLINICAL DATA:  54 year old female status post total right hip arthroplasty. EXAM: PORTABLE PELVIS 1-2 VIEWS COMPARISON:  Earlier fluoroscopic study dated 09/08/2019. FINDINGS: Status post total right hip arthroplasty. The arthroplasty components appear intact and in anatomic alignment. There is no acute fracture or dislocation. Postsurgical changes of the soft tissues of the right hip. IMPRESSION: Status post total right hip arthroplasty. Electronically Signed   By: Elgie Collard M.D.   On: 09/08/2019 16:10   DG C-Arm 1-60 Min  Result Date: 09/08/2019 CLINICAL DATA:   54 year old female right hip arthroplasty anterior approach. EXAM: DG C-ARM 1-60 MIN; OPERATIVE RIGHT HIP WITH PELVIS FLUOROSCOPY TIME:  Fluoroscopy Time:  0 minutes 21 seconds Radiation Exposure Index (if provided by the fluoroscopic device): 2.3 mGy Number of Acquired Spot Images: 0 COMPARISON:  Hip MRI 08/12/2019. FINDINGS: 4 intraoperative fluoroscopic spot views of the lower pelvis and right hip. Right hip arthroplasty hardware in place on the final three views with evidence of normal alignment. No unexpected osseous changes. IMPRESSION: Intraoperative images of right hip arthroplasty with no adverse features. Electronically Signed   By: Rexene Edison  [place] Crane M.D.   On: 09/08/2019 16:58   DG HIP OPERATIVE UNILAT W OR W/O PELVIS RIGHT  Result Date: 09/08/2019 CLINICAL DATA:  54 year old female right hip arthroplasty anterior approach. EXAM: DG C-ARM 1-60 MIN; OPERATIVE RIGHT HIP WITH PELVIS FLUOROSCOPY TIME:  Fluoroscopy Time:  0 minutes 21 seconds Radiation Exposure Index (if provided by the fluoroscopic device): 2.3 mGy Number of Acquired Spot Images: 0 COMPARISON:  Hip MRI 08/12/2019. FINDINGS: 4 intraoperative fluoroscopic spot views of the lower pelvis and right hip. Right hip arthroplasty hardware in place on the final three views with evidence of normal alignment. No unexpected osseous changes. IMPRESSION: Intraoperative images of right hip arthroplasty with no adverse features. Electronically Signed   By: Genevie Ann M.D.   On: 09/08/2019 16:58    Disposition: Discharge disposition: 01-Home or [place]    Mcarthur Rossetti, MD Follow up in 2 week(s).   Specialty: Orthopedic Surgery Contact information: [address] [place] [address] [phone]            Signed: Mcarthur Rossetti 09/11/2019, 3:13 PM

## 2019-09-11 NOTE — Progress Notes (Signed)
Patient ID: Kathy Nguyen, female   DOB: Feb 11, 1966, 54 y.o.   MRN: [id number] Doing better today.  Can be discharged to home this afternoon.

## 2019-09-11 NOTE — Progress Notes (Signed)
Kathy Nguyen to be D/C'd  per MD order. Discussed with the patient and all questions fully answered.  VSS, Skin clean, dry and intact without evidence of skin break down, no evidence of skin tears noted.  IV catheter discontinued intact. Site without signs and symptoms of complications. Dressing and pressure applied.  An After Visit Summary was printed and given to the patient.  D/c education completed with patient/family including follow up instructions, medication list, d/c activities limitations if indicated, with other d/c instructions as indicated by MD - patient able to verbalize understanding, all questions fully answered.   Patient instructed to return to ED, call 911, or call MD for any changes in condition.   Patient to be escorted via WC, and D/C home via private auto.

## 2019-09-11 NOTE — Plan of Care (Signed)

## 2019-09-11 NOTE — Progress Notes (Signed)
Physical Therapy Treatment Patient Details Name: Kathy Nguyen MRN: [id number] DOB: November 26, 1965 Today's Date: 09/11/2019    History of Present Illness Pt is a 54 y.o. F with no significant PMH who presents with right hip osteoarthritis s/p right total hip arthroplasty, direct anterior approach.     PT Comments    Pt performing mobility with increased ease this morning and states, "I feel more confident in how I'm moving." Ambulating a total of ~20 feet with a walker, ultimately limited in further distance and required seated rest break due to continued difficulty with nausea. RN notified for Zofran. Rest of session focused on seated exercises for RLE strengthening.    Follow Up Recommendations  Home health PT;Supervision for mobility/OOB     Equipment Recommendations  None recommended by PT    Recommendations for Other Services       Precautions / Restrictions Precautions Precautions: Fall Restrictions Weight Bearing Restrictions: Yes RLE Weight Bearing: Weight bearing as tolerated    Mobility  Bed Mobility Overal bed mobility: Needs Assistance Bed Mobility: Supine to Sit     Supine to sit: Supervision     General bed mobility comments: Increased time, HOB elevated. No physical assist required.  Transfers Overall transfer level: Needs assistance Equipment used: Rolling walker (2 wheeled) Transfers: Sit to/from Stand Sit to Stand: Supervision         General transfer comment: Cues for placing right foot anteriorly for pain control  Ambulation/Gait Ambulation/Gait assistance: Min guard Gait Distance (Feet): 20 Feet(10", 10") Assistive device: Rolling walker (2 wheeled) Gait Pattern/deviations: Step-to pattern;Antalgic;Decreased stance time - right;Decreased weight shift to right Gait velocity: decreased   General Gait Details: Pt with good sequencing, able to self cue for walker proximity when negotiating threshold, chair follow utilized.   Stairs              Wheelchair Mobility    Modified Rankin (Stroke Patients Only)       Balance Overall balance assessment: Needs assistance Sitting-balance support: Feet supported Sitting balance-Leahy Scale: Normal     Standing balance support: Bilateral upper extremity supported Standing balance-Leahy Scale: Fair                              Cognition Arousal/Alertness: Awake/alert Behavior During Therapy: WFL for tasks assessed/performed Overall Cognitive Status: Within Functional Limits for tasks assessed                                        Exercises Total Joint Exercises Hip ABduction/ADduction: Both;10 reps;Seated(isometric) Long Arc Quad: Right;10 reps;Seated    General Comments        Pertinent Vitals/Pain Pain Assessment: Faces Faces Pain Scale: Hurts even more Pain Location: R hip Pain Descriptors / Indicators: Burning Pain Intervention(s): Monitored during session    Home Living                      Prior Function            PT Goals (current goals can now be found in the care plan section) Acute Rehab PT Goals Patient Stated Goal: "less pain." Potential to Achieve Goals: Good Progress towards PT goals: Progressing toward goals    Frequency    7X/week      PT Plan Current plan remains appropriate    Co-evaluation  AM-PAC PT "6 Clicks" Mobility   Outcome Measure  Help needed turning from your back to your side while in a flat bed without using bedrails?: None Help needed moving from lying on your back to sitting on the side of a flat bed without using bedrails?: None Help needed moving to and from a bed to a chair (including a wheelchair)?: None Help needed standing up from a chair using your arms (e.g., wheelchair or bedside chair)?: None Help needed to walk in hospital room?: A Little Help needed climbing 3-5 steps with a railing? : A Little 6 Click Score: 22    End of Session  Equipment Utilized During Treatment: Gait belt Activity Tolerance: Other (comment)(limited by nausea) Patient left: with call bell/phone within reach;in bed;with family/visitor present Nurse Communication: Mobility status PT Visit Diagnosis: Pain;Difficulty in walking, not elsewhere classified (R26.2) Pain - Right/Left: Right Pain - part of body: Hip     Time: 1753-0104 PT Time Calculation (min) (ACUTE ONLY): 29 min  Charges:  $Gait Training: 8-22 mins $Therapeutic Activity: 8-22 mins                       Lillia Pauls, PT, DPT Acute Rehabilitation Services Pager [phone] Office [phone]    Norval Morton 09/11/2019, 9:02 AM

## 2019-09-15 ENCOUNTER — Telehealth: Payer: Self-pay | Admitting: Orthopaedic Surgery

## 2019-09-15 MED ORDER — OXYCODONE HCL 5 MG PO TABS: 5.0000 mg | ORAL_TABLET | ORAL | 0 refills | Status: DC | PRN

## 2019-09-15 NOTE — Telephone Encounter (Signed)
Pt called stating she would like a refill of her oxycodone 5mg ; pt would like a call when this has been called in.   [phone]

## 2019-09-15 NOTE — Telephone Encounter (Signed)
Please advise 

## 2019-09-15 NOTE — Telephone Encounter (Signed)
I was able to send some more pain medication into her pharmacy.

## 2019-09-22 ENCOUNTER — Other Ambulatory Visit: Payer: Self-pay

## 2019-09-22 ENCOUNTER — Ambulatory Visit (INDEPENDENT_AMBULATORY_CARE_PROVIDER_SITE_OTHER): Payer: 59 | Admitting: Physician Assistant

## 2019-09-22 ENCOUNTER — Encounter: Payer: Self-pay | Admitting: Physician Assistant

## 2019-09-22 DIAGNOSIS — Z96641 Presence of right artificial hip joint: Secondary | ICD-10-CM

## 2019-09-22 MED ORDER — OXYCODONE HCL 5 MG PO TABS: 5.0000 mg | ORAL_TABLET | ORAL | 0 refills | Status: DC | PRN

## 2019-09-22 NOTE — Progress Notes (Signed)
HPI: [name] returns now 2 weeks status post right total hip arthroplasty.  She states she slowly improving.  She said no shortness of breath chest pain.  She has seen for refill on oxycodone.  She does state that either gabapentin or Robaxin has been causing her to have a headache.  Most her pain is in her thigh.  She also notes some numbness about the incision.Marland Kitchen  Physical exam: General well-developed well-nourished female no acute distress mood affect appropriate The right hip surgical incisions healing well no signs of infection.  No significant seroma slight edema.  Right calf supple nontender dorsiflexion plantarflexion right ankle intact.  She is able to get on and off the exam table on her own.  Impression: Status post right total hip arthroplasty 09/08/2019  Plan: She will stop the gabapentin.  Continue her oxycodone and methocarbamol.  Scar tissue mobilization encouraged.  Follow-up with [place] in 1 month sooner if there is any questions concerns.

## 2019-09-28 ENCOUNTER — Telehealth: Payer: Self-pay

## 2019-09-28 ENCOUNTER — Telehealth: Payer: Self-pay | Admitting: Orthopaedic Surgery

## 2019-09-28 NOTE — Telephone Encounter (Signed)
Patient was transferred to triage stating that she is having severe pain in her lower right quadrant and that she is really sick.  Stated that she is not sure what she needs to do, stopped her pain medication 2 days ago and has tried mineral oils and probiotics.  Had right hip surgery 09/08/2019.  Cb# [phone].  Please advise.  Thank you.

## 2019-09-28 NOTE — Telephone Encounter (Signed)
Patient states she is constipated and in a lot of pain. She states she hasn't taken a full dose of the milk of mag out of fear. I told her to try that but the entire dose, drink a lot of liquids, and last resort-an enema.. She states she has chest pain on and off and I told her to please not ignore that. If she continues chest pain she NEEDS to call 911

## 2019-09-28 NOTE — Telephone Encounter (Signed)
If her right lower quadrant pain is abdominal, she may need to see her primary care physician to make sure that she is not having issues such as appendicitis or something related to potentially ovaries.

## 2019-09-28 NOTE — Telephone Encounter (Signed)
Patient would like a call back from nurse from Dr. Magnus Ivan. Patient stated it is an emergency for a call back. Patient phone number is [phone]. Patient was transferred to triage nurse

## 2019-10-06 ENCOUNTER — Telehealth: Payer: Self-pay | Admitting: Orthopaedic Surgery

## 2019-10-06 NOTE — Telephone Encounter (Signed)
Patient called. She needs a root canal done. Would  Like autobiotic called in to [facility] - [place], [place] - [address]

## 2019-10-07 ENCOUNTER — Other Ambulatory Visit: Payer: Self-pay

## 2019-10-07 MED ORDER — CLINDAMYCIN HCL 150 MG PO CAPS
ORAL_CAPSULE | ORAL | 0 refills | Status: DC
Start: 2019-10-07 — End: 2020-07-13

## 2019-10-07 NOTE — Telephone Encounter (Signed)
Sent in to pharmacy.  

## 2019-10-21 ENCOUNTER — Encounter: Payer: Self-pay | Admitting: Physician Assistant

## 2019-10-21 ENCOUNTER — Other Ambulatory Visit: Payer: Self-pay

## 2019-10-21 ENCOUNTER — Ambulatory Visit (INDEPENDENT_AMBULATORY_CARE_PROVIDER_SITE_OTHER): Payer: 59 | Admitting: Physician Assistant

## 2019-10-21 VITALS — BP 116/74 | HR 97

## 2019-10-21 DIAGNOSIS — R42 Dizziness and giddiness: Secondary | ICD-10-CM

## 2019-10-21 DIAGNOSIS — Z96641 Presence of right artificial hip joint: Secondary | ICD-10-CM

## 2019-10-21 LAB — HEMATOCRIT: HCT: 35.4 % (ref 35.0–45.0)

## 2019-10-21 LAB — EXTRA SPECIMEN

## 2019-10-21 LAB — HEMOGLOBIN: Hemoglobin: 11.4 g/dL — ABNORMAL LOW (ref 11.7–15.5)

## 2019-10-21 NOTE — Progress Notes (Signed)
HPI: [name] returns today now 6 weeks status post right total hip arthroplasty.  She states that she is still ambulating with a cane.  She states that if she is active for any length of time that she feels very tired at times gets dizzy and has shaking episodes.  She had no fevers chills shortness of breath or chest pain.  She states she drove from [place] to [place] last week and for the next 2 days had significant pain.  Has taken no pain medication.   Physical exam: Right hip surgical incisions healing well.  Good range of motion right hip without significant pain.  Calf supple nontender.  Dorsiflexion plantarflexion right ankle intact.   Impression: Status post right total hip arthroplasty Dizziness and malaise  Plan: We checked her blood pressure today and blood pressure was 116/74.  We will also check an hemoglobin adequate rule out anemia stenosis source of her dizziness and general malaise.  We will call her with results.  See her back in 1 month to see what type of progress she is making.  Questions were encouraged and answered at length

## 2019-10-21 NOTE — Addendum Note (Signed)
Addended by: Shonna Chock on: 10/21/2019 04:16 PM   Modules accepted: Orders

## 2019-10-22 ENCOUNTER — Telehealth: Payer: Self-pay | Admitting: Radiology

## 2019-10-22 NOTE — Telephone Encounter (Signed)
Called and advised lab work of H&H.

## 2019-11-23 ENCOUNTER — Encounter: Payer: Self-pay | Admitting: Physician Assistant

## 2019-11-23 ENCOUNTER — Ambulatory Visit (INDEPENDENT_AMBULATORY_CARE_PROVIDER_SITE_OTHER): Payer: 59 | Admitting: Physician Assistant

## 2019-11-23 DIAGNOSIS — Z96641 Presence of right artificial hip joint: Secondary | ICD-10-CM

## 2019-11-23 NOTE — Progress Notes (Signed)
HPI: [name] returns today status post right total hip arthroplasty 09/08/2019.  She states she is still very sore and tender.  She ambulates with a cane when out of the home.  She dates that her incision overall is healing well.  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  She feels that her overall dizziness and the sensation of the entire it is slowly getting better.  She has been using silicone sheets over her incision vitamin E oil.  She is taking Aleve for pain.  Physical exam: Right hip surgical incision is healed well no signs of infection.  Good range of motion of the right hip without significant pain.  She ambulates with a slight antalgic gait but uses a cane.  Impression: Status post right total hip arthroplasty 09/08/2019  Plan: She will continue to work on range of motion strengthening right hip.  Follow-up with [place] in 1 month to see what type of progress she has made.  Continue work on scar tissue mobilization.  Questions encouraged and answered at length

## 2019-12-03 ENCOUNTER — Other Ambulatory Visit: Payer: Self-pay | Admitting: Radiology

## 2019-12-03 ENCOUNTER — Telehealth: Payer: Self-pay | Admitting: Radiology

## 2019-12-03 ENCOUNTER — Telehealth: Payer: Self-pay

## 2019-12-03 DIAGNOSIS — Z96641 Presence of right artificial hip joint: Secondary | ICD-10-CM

## 2019-12-03 DIAGNOSIS — M7989 Other specified soft tissue disorders: Secondary | ICD-10-CM

## 2019-12-03 DIAGNOSIS — I83893 Varicose veins of bilateral lower extremities with other complications: Secondary | ICD-10-CM

## 2019-12-03 DIAGNOSIS — M79661 Pain in right lower leg: Secondary | ICD-10-CM

## 2019-12-03 NOTE — Telephone Encounter (Signed)
Patient needs doppler STAT.  I have attempted to contact patient again advising to go to ER to be checked for blood clot, advised if not someone will call her tomorrow morning to schedule doppler in the morning, highly advised going to ER. Also advised begin taking an asprin per Bronson Curb.  Unable to actually speak with patient yet. Have called and left voicemails.

## 2019-12-03 NOTE — Telephone Encounter (Signed)
error 

## 2019-12-03 NOTE — Telephone Encounter (Signed)
Patient called stating that she is having severe right leg pain, tightness, and swelling.  Stated that she has been having pain in her left leg to.  Right calf has been swollen per patient.   Had right total hip surgery on 09/08/2019.  Cb# [phone].  Please advise.  Thank you.

## 2019-12-03 NOTE — Telephone Encounter (Signed)
Noted  

## 2019-12-03 NOTE — Telephone Encounter (Signed)
Still unable to get in contact with patient. Called emergency contact Raiford Noble, explained patient needed to go to ER to rule out blood clot. Attempted to call multiple times, no answer. Left voicemail advising.

## 2019-12-03 NOTE — Telephone Encounter (Signed)
ASA, ER doppler

## 2019-12-03 NOTE — Telephone Encounter (Signed)
Attempted to call patient had to left a voicemail.  Left message to call back to office to schedule appointment Monday, openings with [facility] Monday morning or afternoon.

## 2019-12-04 ENCOUNTER — Telehealth: Payer: Self-pay | Admitting: *Deleted

## 2019-12-04 ENCOUNTER — Other Ambulatory Visit: Payer: Self-pay

## 2019-12-04 ENCOUNTER — Ambulatory Visit (HOSPITAL_COMMUNITY)
Admission: RE | Admit: 2019-12-04 | Discharge: 2019-12-04 | Disposition: A | Discharge status: Home / Self Care | Payer: 59 | Source: Ambulatory Visit | Attending: Physician Assistant | Admitting: Physician Assistant

## 2019-12-04 DIAGNOSIS — Z96641 Presence of right artificial hip joint: Secondary | ICD-10-CM | POA: Diagnosis not present

## 2019-12-04 DIAGNOSIS — M7989 Other specified soft tissue disorders: Secondary | ICD-10-CM | POA: Diagnosis not present

## 2019-12-04 DIAGNOSIS — M79661 Pain in right lower leg: Secondary | ICD-10-CM | POA: Insufficient documentation

## 2019-12-04 NOTE — Telephone Encounter (Signed)
Please see earlier phone note on this pt, pt is scheduled to have [place] today

## 2019-12-04 NOTE — Telephone Encounter (Signed)
Pt is scheduled today 12/04/19 for [place] for DVT at [facility] at 1:00pm pt Significant other is aware of appt and will have her there. Rick (SO) explained that pt called the ED last night and spoke with a nurse and she told her she did not need to come in to ED and to wait until morning to hear from [place] to get appt scheduled.

## 2019-12-04 NOTE — Telephone Encounter (Signed)
Edmonia Caprio, CMA   12/04/19 9:27 AM Note Pt is scheduled today 12/04/19 for [place] for DVT at [facility] at 1:00pm pt Significant other is aware of appt and will have her there. Rick (SO) explained that pt called the ED last night and spoke with a nurse and she told her she did not need to come in to ED and to wait until morning to hear from [place] to get appt scheduled

## 2019-12-07 ENCOUNTER — Ambulatory Visit (INDEPENDENT_AMBULATORY_CARE_PROVIDER_SITE_OTHER): Payer: 59 | Admitting: Physician Assistant

## 2019-12-07 ENCOUNTER — Ambulatory Visit: Payer: 59 | Admitting: Physician Assistant

## 2019-12-07 ENCOUNTER — Encounter: Payer: Self-pay | Admitting: Physician Assistant

## 2019-12-07 ENCOUNTER — Ambulatory Visit (INDEPENDENT_AMBULATORY_CARE_PROVIDER_SITE_OTHER): Payer: 59

## 2019-12-07 DIAGNOSIS — M5416 Radiculopathy, lumbar region: Secondary | ICD-10-CM

## 2019-12-07 DIAGNOSIS — Z96641 Presence of right artificial hip joint: Secondary | ICD-10-CM

## 2019-12-07 MED ORDER — GABAPENTIN 100 MG PO CAPS: 200.0000 mg | ORAL_CAPSULE | Freq: Three times a day (TID) | ORAL | 1 refills | Status: DC

## 2019-12-07 MED ORDER — METHOCARBAMOL 500 MG PO TABS: 500.0000 mg | ORAL_TABLET | Freq: Four times a day (QID) | ORAL | 1 refills | Status: DC | PRN

## 2019-12-07 NOTE — Progress Notes (Signed)
Office Visit Note   Patient: Kathy Nguyen           Date of Birth: 25-Aug-1965           MRN: [id number] Visit Date: 12/07/2019              Requested by: No referring provider defined for this encounter. PCP: System, Pcp Not In   Assessment & Plan: Visit Diagnoses:  1. Status post total replacement of right hip   2. Pain, radicular, lumbar     Plan: Spoke with her about placing her on a Medrol Dosepak she states this makes her even more anxious.  Therefore we will will increase her gabapentin to 200 mg 3 times daily.  She is given a prescription for Robaxin.  Of asked her to stop her aspirin oxycodone.  We will send her to formal physical therapy for core strengthening hamstring stretching back exercises home exercise program modalities.  She will follow-up with [place] in 1 month to see how she is progressing.  Questions were encouraged and answered by Dr. Magnus Ivan itself.  Reassurance was given that her strength should improve with physical therapy.  Follow-Up Instructions: Return in about 4 weeks (around 01/04/2020).   Orders:  Orders Placed This Encounter  Procedures  . XR Lumbar Spine 2-3 Views  . Ambulatory referral to Physical Therapy   Meds ordered this encounter  Medications  . gabapentin (NEURONTIN) 100 MG capsule    Sig: Take 2 capsules (200 mg total) by mouth 3 (three) times daily.    Dispense:  90 capsule    Refill:  1  . methocarbamol (ROBAXIN) 500 MG tablet    Sig: Take 1 tablet (500 mg total) by mouth every 6 (six) hours as needed for muscle spasms.    Dispense:  40 tablet    Refill:  1      Procedures: No procedures performed   Clinical Data: No additional findings.   Subjective: Chief Complaint  Patient presents with  . Right Leg - Pain, Leg Swelling  . Left Leg - Leg Swelling    HPI Kathy Nguyen returns today due to bilateral leg swelling pain.  She states her swelling is worse at the end of the day.  She is status post right total hip  arthroplasty 09/08/2019.  She states she just feels tired all the time.  She did undergo a Doppler of her right lower extremity on 12/04/2019 and there is no evidence of DVT or SVT.  She states she is gone back on her oxycodone gabapentin aspirin and Robaxin due to the pain.  She states she has swelling that is worse at the end of the day in both legs.  Pain that radiates from the gluteus area down the lateral aspect of both legs to the calf region.  No particular back pain.  Pain is worse with sitting.  She still ambulating with a cane. Review of Systems Negative for fevers or chills.  Please see HPI otherwise negative or noncontributory.  Objective: Vital Signs: There were no vitals taken for this visit.  Physical Exam Constitutional:      Appearance: She is not ill-appearing or diaphoretic.  Cardiovascular:     Pulses: Normal pulses.  Pulmonary:     Effort: Pulmonary effort is normal.  Neurological:     Mental Status: She is alert and oriented to person, place, and time.  Psychiatric:        Mood and Affect: Mood normal.  Ortho Exam Lower extremity strengths she has 5-5 strength throughout lower extremities except for dorsiflexion of the right ankle against resistance 4-5 strength.  She is shaky with hip flexion both hips against resistance tight hamstrings bilaterally.  Good range of motion of both hips without significant pain. Specialty Comments:  No specialty comments available.  Imaging: XR Lumbar Spine 2-3 Views  Result Date: 12/07/2019 Lumbar spine 2 views: Normal lordotic curvature.  No spondylolisthesis.  Facet changes lower lumbar region.  No acute fractures or bony abnormalities otherwise.    PMFS History: Patient Active Problem List   Diagnosis Date Noted  . Status post total replacement of right hip 09/08/2019  . Unilateral primary osteoarthritis, right hip 08/17/2019   Past Medical History:  Diagnosis Date  . Anxiety   . Arthritis   . Depression   . Family  history of adverse reaction to anesthesia    Father had a hard time awaking  . GERD (gastroesophageal reflux disease)   . Headache   . History of hiatal hernia   . Hypertension   . Pneumonia   . PONV (postoperative nausea and vomiting)     History reviewed. No pertinent family history.  Past Surgical History:  Procedure Laterality Date  . ABDOMINAL HYSTERECTOMY  2008  . 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 BREAST SURGERY  2008   Reduction & Lift  . BREAST SURGERY     Implants  . TOTAL HIP ARTHROPLASTY Right 09/08/2019   Procedure: RIGHT TOTAL HIP ARTHROPLASTY ANTERIOR APPROACH;  Surgeon: [name], MD;  Location: [facility] OR;  Service: Orthopedics;  Laterality: Right;  . VULVA / PERINEUM BIOPSY Right    Social History   Occupational History  . Not on file  Tobacco Use  . Smoking status: Never Smoker  . Smokeless tobacco: Never Used  Vaping Use  . Vaping Use: Never used  Substance and Sexual Activity  . Alcohol use: Not Currently  . Drug use: Never  . Sexual activity: Not on file

## 2019-12-15 ENCOUNTER — Telehealth: Payer: Self-pay | Admitting: Physician Assistant

## 2019-12-15 NOTE — Telephone Encounter (Signed)
Patient called. She would like to come here for PT. [facility] does not accept her insurance.

## 2019-12-15 NOTE — Telephone Encounter (Signed)
Can we switch to here instead of [facility]?

## 2019-12-15 NOTE — Telephone Encounter (Signed)
Changed referral location. Sent message to [facility] with Physical Therapy to get approval and schedule.

## 2019-12-16 ENCOUNTER — Encounter: Payer: Self-pay | Admitting: Physical Therapy

## 2019-12-16 ENCOUNTER — Other Ambulatory Visit: Payer: Self-pay

## 2019-12-16 ENCOUNTER — Ambulatory Visit (INDEPENDENT_AMBULATORY_CARE_PROVIDER_SITE_OTHER): Payer: 59 | Admitting: Physical Therapy

## 2019-12-16 ENCOUNTER — Telehealth: Payer: Self-pay | Admitting: Physician Assistant

## 2019-12-16 DIAGNOSIS — M6281 Muscle weakness (generalized): Secondary | ICD-10-CM

## 2019-12-16 DIAGNOSIS — M25651 Stiffness of right hip, not elsewhere classified: Secondary | ICD-10-CM | POA: Diagnosis not present

## 2019-12-16 DIAGNOSIS — M5416 Radiculopathy, lumbar region: Secondary | ICD-10-CM

## 2019-12-16 DIAGNOSIS — M25551 Pain in right hip: Secondary | ICD-10-CM | POA: Diagnosis not present

## 2019-12-16 NOTE — Telephone Encounter (Signed)
Pt applied for temporary disability and wanted to know if there was anyway for [place] to show all her appts and the appt notes without printing them off? Pt states email would work for her  [email or web address] [phone]

## 2019-12-17 NOTE — Telephone Encounter (Signed)
Can this be done for amount of records and patient would still needs to fill our release form for records?

## 2019-12-17 NOTE — Patient Instructions (Signed)
Access Code: [email or web address] URL: [email or web address] Date: 12/16/2019 Prepared by: Moshe Cipro  Exercises  . Hooklying Single Knee to Chest - 2 x daily - 7 x weekly - 3 reps - 1 sets - 30 sec hold . Supine Piriformis Stretch with Towel - 2 x daily - 7 x weekly - 1 sets - 3 reps - 30 sec hold . Hooklying Hamstring Stretch with Strap - 2 x daily - 7 x weekly - 1 sets - 3 reps - 30 sec hold . Clamshell - 2 x daily - 7 x weekly - 2 sets - 10 reps . Sit to Stand - 2 x daily - 7 x weekly - 2 sets - 10 reps

## 2019-12-17 NOTE — Therapy (Signed)
[facility] Physical Therapy [address] [place], [place], [address] Phone: [phone]   Fax:  [phone]  Physical Therapy Evaluation  Patient Details  Name: Kathy Nguyen MRN: [id number] Date of Birth: Dec 02, 1965 Referring Provider (PT): Kirtland Bouchard, [place]   Encounter Date: 12/16/2019   PT End of Session - 12/17/19 0741    Visit Number 1    Number of Visits 12    Date for PT Re-Evaluation 01/28/20    PT Start Time 1513    PT Stop Time 1600    PT Time Calculation (min) 47 min    Activity Tolerance Patient tolerated treatment well    Behavior During Therapy [facility] for tasks assessed/performed           Past Medical History:  Diagnosis Date  . Anxiety   . Arthritis   . Depression   . Family history of adverse reaction to anesthesia    Father had a hard time awaking  . GERD (gastroesophageal reflux disease)   . Headache   . History of hiatal hernia   . Hypertension   . Pneumonia   . PONV (postoperative nausea and vomiting)     Past Surgical History:  Procedure Laterality Date  . ABDOMINAL HYSTERECTOMY  2008  . 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 BREAST SURGERY  2008   Reduction & Lift  . BREAST SURGERY     Implants  . TOTAL HIP ARTHROPLASTY Right 09/08/2019   Procedure: RIGHT TOTAL HIP ARTHROPLASTY ANTERIOR APPROACH;  Surgeon: [name], MD;  Location: [facility] OR;  Service: Orthopedics;  Laterality: Right;  . VULVA / PERINEUM BIOPSY Right     There were no vitals filed for this visit.    Subjective Assessment - 12/16/19 1522    Subjective Pt is a 54 y/o female who presents to OPPT s/p Rt THA on 09/08/19, with underlying LBP which is consistent with stenosis.  Pt had 2 injections for trochanteric bursitis late 2020, then saw ortho MD in May, with MRI revealing need for THA.  Pt had Rt THA on 09/08/19, then approximately 2 weeks ago sat for virtual interviews for > 1 hour and has had increased LBP  radiating into lower extremities.  Pt states Rt side worse than Lt at this time.  U/S negative for DVT.    Pertinent History anxiety, depression, HTN    Limitations Sitting;Standing;Walking    How long can you sit comfortably? 30 min    How long can you stand comfortably? 2.5 hours    How long can you walk comfortably? unsure (only doing household ambulation at this time)    Diagnostic tests xrays: spinal stenosis    Patient Stated Goals improve pain    Currently in Pain? Yes    Pain Score 4    up to 10/10; at best 0/10   Pain Location Back    Pain Orientation Right;Left;Lower    Pain Descriptors / Indicators Constant;Aching    Pain Type Acute pain    Pain Radiating Towards bil LEs, Rt worse than Lt    Pain Onset 1 to 4 weeks ago    Pain Frequency Constant    Aggravating Factors  sitting, standing    Pain Relieving Factors unweighting in water              [facility] PT Assessment - 12/16/19 1529      Assessment   Medical Diagnosis M54.16 (ICD-10-CM) -  Pain, radicular, lumbar; Z96.641 (ICD-10-CM) - Status post total replacement of right hip    Referring Provider (PT) Kirtland Bouchard, PA-C    Onset Date/Surgical Date 09/08/19    Hand Dominance Right    Next MD Visit 01/04/20    Prior Therapy HHPT following THA      Precautions   Precautions None      Restrictions   Weight Bearing Restrictions No      Balance Screen   Has the patient fallen in the past 6 months No    Has the patient had a decrease in activity level because of a fear of falling?  Yes    Is the patient reluctant to leave their home because of a fear of falling?  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, 1 time a month     Leisure painting, decorating, cooking/baking, walking for regular exercise      Cognition   Overall Cognitive Status Within Functional Limits for tasks assessed      Posture/Postural Control   Posture/Postural Control Postural limitations    Postural Limitations Rounded Shoulders;Forward head;Decreased lumbar lordosis      ROM / Strength   AROM / PROM / Strength AROM;Strength      AROM   AROM Assessment Site Lumbar    Lumbar Flexion limited 25% - increase in symptoms with 2nd rep    Lumbar Extension WNL    Lumbar - Right Side Bend WNL    Lumbar - Left Side Bend limited 25% with Rt side pain    Lumbar - Right Rotation WNL    Lumbar - Left Rotation WNL      Strength   Overall Strength Comments giveway weakness noted    Strength Assessment Site Hip;Knee;Ankle    Right/Left Hip Right;Left    Right Hip Flexion 3+/5    Right Hip Extension 3-/5    Right Hip ABduction 3/5    Left Hip Flexion 4/5    Left Hip Extension 4/5    Left Hip ABduction 4/5    Right/Left Knee Right;Left    Right Knee Flexion 4/5    Right Knee Extension 5/5    Left Knee Flexion 4/5    Left Knee Extension 5/5    Right/Left Ankle Right;Left    Right Ankle Dorsiflexion 5/5    Left Ankle Dorsiflexion 5/5      Flexibility   Soft Tissue Assessment /Muscle Length yes    Hamstrings tightness bil    Piriformis tightness bil      Palpation   Palpation comment no significant tenderness noted      Special Tests    Special Tests Lumbar    Lumbar Tests Slump Test      Slump test   Findings Negative                      Objective measurements completed  on examination: See above findings.       [facility] Adult PT Treatment/Exercise - 12/16/19 1529      Exercises   Exercises Other Exercises    Other Exercises  see  pt instructions -performed 1-3 reps of each exercise                  PT Education - 12/17/19 0740    Education Details HEP    Person(s) Educated Patient    Methods Explanation;Demonstration;Handout    Comprehension Verbalized understanding;Returned demonstration;Need further instruction            PT Short Term Goals - 12/17/19 0744      PT SHORT TERM GOAL #1   Title independent with initial HEP    Status New    Target Date 01/07/20             PT Long Term Goals - 12/17/19 0745      PT LONG TERM GOAL #1   Title independent with advanced HEP    Status New    Target Date 01/28/20      PT LONG TERM GOAL #2   Title perform lumbar ROM without increase in pain for improved function    Status New    Target Date 01/28/20      PT LONG TERM GOAL #3   Title report pain < 4/10 with standing/walking activities for improved activity tolerance    Status New    Target Date 01/28/20      PT LONG TERM GOAL #4   Title demonstrate 4/5 Rt hip strength for improved function    Status New    Target Date 01/28/20                  Plan - 12/17/19 0741    Clinical Impression Statement Pt is a 54 y/o female who presents to OPPT with Rt sided LBP as well as s/p Rt THA.  Pt overall demonstrates decreased strength and flexibility, as well as continued pain affecting functional mobility.  Pt will benefit from PT to maximize function.    Personal Factors and Comorbidities Comorbidity 3+    Comorbidities anxiety, depression, HTN    Examination-Activity Limitations Lift;Stand;Locomotion Level;Bend;Transfers;Carry;Sit;Squat;Dressing;Stairs;Hygiene/Grooming    Examination-Participation Restrictions Community Activity;Occupation;Interpersonal Relationship;Shop    Stability/Clinical Decision Making Evolving/Moderate complexity    Clinical Decision Making Moderate    Rehab Potential Good    PT Frequency 2x / week   1-2x/wk   PT Duration 6 weeks    PT Treatment/Interventions  ADLs/Self Care Home Management;Cryotherapy;Electrical Stimulation;Ultrasound;Traction;Moist Heat;Gait training;Stair training;Functional mobility training;Therapeutic activities;Therapeutic exercise;Balance training;Neuromuscular re-education;Patient/family education;Manual techniques;Passive range of motion;Dry needling;Taping    PT Next Visit Plan review HEP, progress core/hip strengthening as able    PT Home Exercise Plan Access Code: [email or web address]    Consulted and Agree with Plan of Care Patient           Patient will benefit from skilled therapeutic intervention in order to improve the following deficits and impairments:  Increased fascial restricitons, Increased muscle spasms, Pain, Postural dysfunction, Hypermobility, Decreased mobility, Decreased range of motion, Decreased strength, Impaired flexibility, Decreased activity tolerance, Decreased endurance  Visit Diagnosis: Radiculopathy, lumbar region - Plan: PT plan of care cert/re-cert  Pain in right hip - Plan: PT plan of care cert/re-cert  Stiffness of right hip, not elsewhere classified - Plan: PT plan of care cert/re-cert  Muscle weakness (generalized) - Plan: PT plan of care cert/re-cert     Problem List Patient Active  Problem List   Diagnosis Date Noted  . Status post total replacement of right hip 09/08/2019  . Unilateral primary osteoarthritis, right hip 08/17/2019      Clarita CraneStephanie F Joden Bonsall, PT, DPT 12/17/19 7:48 AM    [facility] [address] [place], [place], [address] Phone: [phone]   Fax:  [phone]  Name: Lyn Henriamela C Everett MRN: [id number] Date of Birth: 12/16/1965

## 2019-12-24 ENCOUNTER — Ambulatory Visit: Payer: 59 | Admitting: Physician Assistant

## 2019-12-31 ENCOUNTER — Ambulatory Visit (INDEPENDENT_AMBULATORY_CARE_PROVIDER_SITE_OTHER): Payer: 59 | Admitting: Physical Therapy

## 2019-12-31 ENCOUNTER — Encounter: Payer: Self-pay | Admitting: Physical Therapy

## 2019-12-31 ENCOUNTER — Other Ambulatory Visit: Payer: Self-pay

## 2019-12-31 DIAGNOSIS — M25551 Pain in right hip: Secondary | ICD-10-CM

## 2019-12-31 DIAGNOSIS — M5416 Radiculopathy, lumbar region: Secondary | ICD-10-CM

## 2019-12-31 DIAGNOSIS — M25651 Stiffness of right hip, not elsewhere classified: Secondary | ICD-10-CM

## 2019-12-31 DIAGNOSIS — M6281 Muscle weakness (generalized): Secondary | ICD-10-CM | POA: Diagnosis not present

## 2019-12-31 NOTE — Therapy (Signed)
[facility] Physical Therapy [address] [place], [place], [address] Phone: [phone]   Fax:  [phone]  Physical Therapy Treatment  Patient Details  Name: Kathy Nguyen MRN: [id number] Date of Birth: May 12, 1965 Referring Provider (PT): Kirtland Bouchard, [place]   Encounter Date: 12/31/2019   PT End of Session - 12/31/19 1307    Visit Number 2    Number of Visits 12    Date for PT Re-Evaluation 01/28/20    PT Start Time 1147    PT Stop Time 1229    PT Time Calculation (min) 42 min    Activity Tolerance Patient tolerated treatment well    Behavior During Therapy [facility] for tasks assessed/performed           Past Medical History:  Diagnosis Date  . Anxiety   . Arthritis   . Depression   . Family history of adverse reaction to anesthesia    Father had a hard time awaking  . GERD (gastroesophageal reflux disease)   . Headache   . History of hiatal hernia   . Hypertension   . Pneumonia   . PONV (postoperative nausea and vomiting)     Past Surgical History:  Procedure Laterality Date  . ABDOMINAL HYSTERECTOMY  2008  . 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 BREAST SURGERY  2008   Reduction & Lift  . BREAST SURGERY     Implants  . TOTAL HIP ARTHROPLASTY Right 09/08/2019   Procedure: RIGHT TOTAL HIP ARTHROPLASTY ANTERIOR APPROACH;  Surgeon: [name], MD;  Location: [facility] OR;  Service: Orthopedics;  Laterality: Right;  . VULVA / PERINEUM BIOPSY Right     There were no vitals filed for this visit.   Subjective Assessment - 12/31/19 1150    Subjective had a good day yesterday, but today is a bad day.  states her knees have been hurting since surgery.    Pertinent History anxiety, depression, HTN    Limitations Sitting;Standing;Walking    How long can you sit comfortably? 30 min    How long can you stand comfortably? 2.5 hours    How long can you walk comfortably? unsure (only doing household  ambulation at this time)    Diagnostic tests xrays: spinal stenosis    Patient Stated Goals improve pain    Currently in Pain? Yes    Pain Score 8     Pain Location Back    Pain Orientation Right;Left;Lower    Pain Descriptors / Indicators Aching;Constant    Pain Type Acute pain    Pain Onset 1 to 4 weeks ago    Pain Frequency Constant    Aggravating Factors  sitting, standing    Pain Relieving Factors unweighting in water                             [facility] Adult PT Treatment/Exercise - 12/31/19 1153      Exercises   Exercises Lumbar      Lumbar Exercises: Stretches   Passive Hamstring Stretch Right;Left;3 reps;30 seconds    Single Knee to Chest Stretch Right;Left;3 reps;30 seconds    Piriformis Stretch Right;Left;3 reps;30 seconds      Lumbar Exercises: Aerobic   Nustep L5 x 8 min      Lumbar Exercises: Supine   Bridge 20 reps;5 seconds    Other Supine Lumbar Exercises hooklying single leg clam with  L4 band x 20 bil                    PT Short Term Goals - 12/17/19 0744      PT SHORT TERM GOAL #1   Title independent with initial HEP    Status New    Target Date 01/07/20             PT Long Term Goals - 12/17/19 0745      PT LONG TERM GOAL #1   Title independent with advanced HEP    Status New    Target Date 01/28/20      PT LONG TERM GOAL #2   Title perform lumbar ROM without increase in pain for improved function    Status New    Target Date 01/28/20      PT LONG TERM GOAL #3   Title report pain < 4/10 with standing/walking activities for improved activity tolerance    Status New    Target Date 01/28/20      PT LONG TERM GOAL #4   Title demonstrate 4/5 Rt hip strength for improved function    Status New    Target Date 01/28/20                 Plan - 12/31/19 1307    Clinical Impression Statement Pt tolerated session well today, and has difficulty with strengthening exercises due to weakness following THA  surgery.  Will continue to benefit from PT to maximize function.    Personal Factors and Comorbidities Comorbidity 3+    Comorbidities anxiety, depression, HTN    Examination-Activity Limitations Lift;Stand;Locomotion Level;Bend;Transfers;Carry;Sit;Squat;Dressing;Stairs;Hygiene/Grooming    Examination-Participation Restrictions Community Activity;Occupation;Interpersonal Relationship;Shop    Stability/Clinical Decision Making Evolving/Moderate complexity    Rehab Potential Good    PT Frequency 2x / week   1-2x/wk   PT Duration 6 weeks    PT Treatment/Interventions ADLs/Self Care Home Management;Cryotherapy;Electrical Stimulation;Ultrasound;Traction;Moist Heat;Gait training;Stair training;Functional mobility training;Therapeutic activities;Therapeutic exercise;Balance training;Neuromuscular re-education;Patient/family education;Manual techniques;Passive range of motion;Dry needling;Taping    PT Next Visit Plan progress core/hip strengthening as able, manual/modalities PRN for pain    PT Home Exercise Plan Access Code: [email or web address]    Consulted and Agree with Plan of Care Patient           Patient will benefit from skilled therapeutic intervention in order to improve the following deficits and impairments:  Increased fascial restricitons, Increased muscle spasms, Pain, Postural dysfunction, Hypermobility, Decreased mobility, Decreased range of motion, Decreased strength, Impaired flexibility, Decreased activity tolerance, Decreased endurance  Visit Diagnosis: Radiculopathy, lumbar region  Pain in right hip  Stiffness of right hip, not elsewhere classified  Muscle weakness (generalized)     Problem List Patient Active Problem List   Diagnosis Date Noted  . Status post total replacement of right hip 09/08/2019  . Unilateral primary osteoarthritis, right hip 08/17/2019      Kathy Nguyen, PT, DPT 12/31/19 1:09 PM    [facility] Physical Therapy [address] [place], [place], [address] Phone: [phone]   Fax:  [phone]  Name: Kathy Nguyen MRN: [id number] Date of Birth: Jul 17, 1965

## 2020-01-04 ENCOUNTER — Ambulatory Visit (INDEPENDENT_AMBULATORY_CARE_PROVIDER_SITE_OTHER): Payer: 59 | Admitting: Physician Assistant

## 2020-01-04 ENCOUNTER — Encounter: Payer: Self-pay | Admitting: Physician Assistant

## 2020-01-04 DIAGNOSIS — Z96641 Presence of right artificial hip joint: Secondary | ICD-10-CM

## 2020-01-04 DIAGNOSIS — M5416 Radiculopathy, lumbar region: Secondary | ICD-10-CM

## 2020-01-04 DIAGNOSIS — M4807 Spinal stenosis, lumbosacral region: Secondary | ICD-10-CM | POA: Diagnosis not present

## 2020-01-04 NOTE — Addendum Note (Signed)
Addended by: Mardene Celeste B on: 01/04/2020 04:01 PM   Modules accepted: Orders

## 2020-01-04 NOTE — Progress Notes (Signed)
Office Visit Note   Patient: Kathy Nguyen           Date of Birth: [birth year]           MRN: [id number] Visit Date: 01/04/2020              Requested by: No referring provider defined for this encounter. PCP: Pcp, No   Assessment & Plan: Visit Diagnoses:  1. Status post total replacement of right hip   2. Pain, radicular, lumbar     Plan: Given patient's continued low back pain that is failed conservative treatment recommend MRI of lumbar spine rule out HNP as a source of pain also for possible epidural steroid planning.  Questions were encouraged and answered.  In regards to her emotional wellbeing recommend that she find a therapist and follow-up with them.  Did give her the name of the therapist group.  [place] that she could contact and see if they have somebody available that could see her in consult.  Follow-Up Instructions: Return After MRI.   Orders:  No orders of the defined types were placed in this encounter.  No orders of the defined types were placed in this encounter.     Procedures: No procedures performed   Clinical Data: No additional findings.   Subjective: Chief Complaint  Patient presents with  . Right Hip - Follow-up  . Lower Back - Follow-up    HPI Kathy Nguyen returns today status post right total hip arthroplasty 09/08/2019.  She states her hip is getting better.  She states her low back pain still very bothersome.  Most of her pain is mid right lower back pain brings her to tears at times.  She states she cannot stand for long period of time and cannot sit for a long time either.  She sleeps with her knees bent.  She does state that the back pain awakens her.  She is unable to answer the question of whether she has any numbness or tingling down the leg but she does have pain in the right thigh.  She notes that her knees feel tight.  She comes to sessions of physical therapy and states therapy helped for about 24 hours and her pain becomes worse.   She is very emotional due to everything that is going on with her back, her hip and the fact that she is looking for a new job.  She did try going up on the gabapentin and is unsure if this helped with her back pain. Also she feels that she may be going through menopause and reports that she has started losing some hair. Review of Systems Negative for fevers or chills.  Objective: Vital Signs: There were no vitals taken for this visit.  Physical Exam Constitutional:      Appearance: She is not ill-appearing or diaphoretic.  Neurological:     Mental Status: She is alert and oriented to person, place, and time.  Psychiatric:        Mood and Affect: Mood is anxious. Affect is tearful.     Ortho Exam Bilateral hips excellent range of motion without pain.  She has negative straight leg raise bilaterally with tight hamstrings bilaterally. Specialty Comments:  No specialty comments available.  Imaging: No results found.   PMFS History: Patient Active Problem List   Diagnosis Date Noted  . Status post total replacement of right hip 09/08/2019  . Unilateral primary osteoarthritis, right hip 08/17/2019   Past Medical History:  Diagnosis  Date  . Anxiety   . Arthritis   . Depression   . Family history of adverse reaction to anesthesia    Father had a hard time awaking  . GERD (gastroesophageal reflux disease)   . Headache   . History of hiatal hernia   . Hypertension   . Pneumonia   . PONV (postoperative nausea and vomiting)     History reviewed. No pertinent family history.  Past Surgical History:  Procedure Laterality Date  . ABDOMINAL HYSTERECTOMY  2008  . 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 BREAST SURGERY  2008   Reduction & Lift  . BREAST SURGERY     Implants  . TOTAL HIP ARTHROPLASTY Right 09/08/2019   Procedure: RIGHT TOTAL HIP ARTHROPLASTY ANTERIOR APPROACH;  Surgeon: [name], MD;  Location: [facility] OR;  Service:  Orthopedics;  Laterality: Right;  . VULVA / PERINEUM BIOPSY Right    Social History   Occupational History  . Not on file  Tobacco Use  . Smoking status: Never Smoker  . Smokeless tobacco: Never Used  Vaping Use  . Vaping Use: Never used  Substance and Sexual Activity  . Alcohol use: Not Currently  . Drug use: Never  . Sexual activity: Not on file

## 2020-01-06 ENCOUNTER — Encounter: Payer: 59 | Admitting: Physical Therapy

## 2020-01-06 ENCOUNTER — Telehealth: Payer: Self-pay | Admitting: Physician Assistant

## 2020-01-06 NOTE — Telephone Encounter (Signed)
Received call from patient, she is needing last ov note faxed to [employer]. I faxed [phone] attn: Jasmine December

## 2020-01-13 ENCOUNTER — Encounter: Payer: 59 | Admitting: Physical Therapy

## 2020-01-19 ENCOUNTER — Ambulatory Visit
Admission: RE | Admit: 2020-01-19 | Discharge: 2020-01-19 | Disposition: A | Discharge status: Home / Self Care | Payer: 59 | Source: Ambulatory Visit | Attending: Physician Assistant | Admitting: Physician Assistant

## 2020-01-19 ENCOUNTER — Other Ambulatory Visit: Payer: Self-pay

## 2020-01-19 DIAGNOSIS — M4807 Spinal stenosis, lumbosacral region: Secondary | ICD-10-CM

## 2020-01-20 ENCOUNTER — Ambulatory Visit (INDEPENDENT_AMBULATORY_CARE_PROVIDER_SITE_OTHER): Payer: 59 | Admitting: Physical Therapy

## 2020-01-20 ENCOUNTER — Encounter: Payer: Self-pay | Admitting: Physical Therapy

## 2020-01-20 DIAGNOSIS — M6281 Muscle weakness (generalized): Secondary | ICD-10-CM

## 2020-01-20 DIAGNOSIS — M5416 Radiculopathy, lumbar region: Secondary | ICD-10-CM

## 2020-01-20 DIAGNOSIS — M25551 Pain in right hip: Secondary | ICD-10-CM | POA: Diagnosis not present

## 2020-01-20 DIAGNOSIS — M25651 Stiffness of right hip, not elsewhere classified: Secondary | ICD-10-CM

## 2020-01-20 NOTE — Patient Instructions (Signed)
Access Code: [email or web address] URL: [email or web address] Date: 01/20/2020 Prepared by: Moshe Cipro  Exercises Hooklying Single Knee to Chest - 2 x daily - 7 x weekly - 3 reps - 1 sets - 30 sec hold Supine Piriformis Stretch with Towel - 2 x daily - 7 x weekly - 1 sets - 3 reps - 30 sec hold Hooklying Hamstring Stretch with Strap - 2 x daily - 7 x weekly - 1 sets - 3 reps - 30 sec hold Clamshell - 2 x daily - 7 x weekly - 2 sets - 10 reps Sit to Stand - 2 x daily - 7 x weekly - 2 sets - 10 reps Right Standing Lateral Shift Correction at Wall - Repetitions - 4-5 x daily - 7 x weekly - 1 sets - 10 reps - 10 sec hold Prone on Elbows Stretch - 4-5 x daily - 7 x weekly - 1 sets - 1 reps - 3 min hold Prone Press Up on Elbows - 4-5 x daily - 7 x weekly - 1 sets - 10 reps - 10 sec hold Seated Anterior Pelvic Tilt - 4-5 x daily - 7 x weekly - 1 sets - 10 reps - 10 sec hold Standing Lumbar Extension - 4-5 x daily - 7 x weekly - 1 sets - 10 reps - 10 sec hold

## 2020-01-20 NOTE — Therapy (Addendum)
[facility] [address] [place], [place], [address] Phone: [phone]   Fax:  [phone]  Physical Therapy Treatment/Discharge Summary  Patient Details  Name: Kathy Nguyen MRN: [id number] Date of Birth: 1965/10/10 Referring Provider (PT): Pete Pelt, [place]   Encounter Date: 01/20/2020   PT End of Session - 01/20/20 1552    Visit Number 3    Number of Visits 12    Date for PT Re-Evaluation 01/28/20    PT Start Time 1311    PT Stop Time 1350    PT Time Calculation (min) 39 min    Activity Tolerance Patient tolerated treatment well    Behavior During Therapy [facility] for tasks assessed/performed           Past Medical History:  Diagnosis Date  . Anxiety   . Arthritis   . Depression   . Family history of adverse reaction to anesthesia    Father had a hard time awaking  . GERD (gastroesophageal reflux disease)   . Headache   . History of hiatal hernia   . Hypertension   . Pneumonia   . PONV (postoperative nausea and vomiting)     Past Surgical History:  Procedure Laterality Date  . ABDOMINAL HYSTERECTOMY  2008  . 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 BREAST SURGERY  2008   Reduction & Lift  . BREAST SURGERY     Implants  . TOTAL HIP ARTHROPLASTY Right 09/08/2019   Procedure: RIGHT TOTAL HIP ARTHROPLASTY ANTERIOR APPROACH;  Surgeon: Mcarthur Rossetti, MD;  Location: [place];  Service: Orthopedics;  Laterality: Right;  . VULVA / PERINEUM BIOPSY Right     There were no vitals filed for this visit.   Subjective Assessment - 01/20/20 1514    Subjective had MRI on Tuesday - wants to know the results. has been having increased migraines and doesn't feel well today.    Pertinent History anxiety, depression, HTN    Limitations Sitting;Standing;Walking    How long can you sit comfortably? 30 min    How long can you stand comfortably? 2.5 hours    How long can you walk comfortably? unsure  (only doing household ambulation at this time)    Diagnostic tests xrays: spinal stenosis    Patient Stated Goals improve pain    Currently in Pain? Yes    Pain Score 4     Pain Location Back    Pain Orientation Right;Left;Lower    Pain Descriptors / Indicators Aching;Constant    Pain Type Acute pain    Pain Onset 1 to 4 weeks ago    Pain Frequency Constant    Aggravating Factors  sitting, standing    Pain Relieving Factors getting in the water                             [facility] Adult PT Treatment/Exercise - 01/20/20 1515      Lumbar Exercises: Stretches   Prone on Elbows Stretch 3 reps;60 seconds   continuous   Press Ups 10 reps;10 seconds    Other Lumbar Stretch Exercise Rt lateral shift correction 10x10 sec      Lumbar Exercises: Aerobic   Nustep L6 x 8 min                  PT Education - 01/20/20 1552    Education Details extension based  HEP    Person(s) Educated Patient    Methods Explanation;Demonstration;Handout    Comprehension Verbalized understanding;Returned demonstration            PT Short Term Goals - 01/20/20 1552      PT SHORT TERM GOAL #1   Title independent with initial HEP    Status Achieved    Target Date 01/07/20             PT Long Term Goals - 12/17/19 0745      PT LONG TERM GOAL #1   Title independent with advanced HEP    Status New    Target Date 01/28/20      PT LONG TERM GOAL #2   Title perform lumbar ROM without increase in pain for improved function    Status New    Target Date 01/28/20      PT LONG TERM GOAL #3   Title report pain < 4/10 with standing/walking activities for improved activity tolerance    Status New    Target Date 01/28/20      PT LONG TERM GOAL #4   Title demonstrate 4/5 Rt hip strength for improved function    Status New    Target Date 01/28/20                 Plan - 01/20/20 1552    Clinical Impression Statement Pt with continued pain as well as migraines  affecting function at this time.  Extension based HEP provided today given MRI showing disc protrusion on L5-S1 nerve root with mild improvement in symptoms.  Initiated pain neuroscience education but limited today due to elevated pain.  Will see what MD says and plan for follow up with PT PRN.    Personal Factors and Comorbidities Comorbidity 3+    Comorbidities anxiety, depression, HTN    Examination-Activity Limitations Lift;Stand;Locomotion Level;Bend;Transfers;Carry;Sit;Squat;Dressing;Stairs;Hygiene/Grooming    Examination-Participation Restrictions Community Activity;Occupation;Interpersonal Relationship;Shop    Stability/Clinical Decision Making Evolving/Moderate complexity    Rehab Potential Good    PT Frequency 2x / week   1-2x/wk   PT Duration 6 weeks    PT Treatment/Interventions ADLs/Self Care Home Management;Cryotherapy;Electrical Stimulation;Ultrasound;Traction;Moist Heat;Gait training;Stair training;Functional mobility training;Therapeutic activities;Therapeutic exercise;Balance training;Neuromuscular re-education;Patient/family education;Manual techniques;Passive range of motion;Dry needling;Taping    PT Next Visit Plan progress core/hip strengthening as able, manual/modalities PRN for pain    PT Home Exercise Plan Access Code: [email or web address]    Consulted and Agree with Plan of Care Patient           Patient will benefit from skilled therapeutic intervention in order to improve the following deficits and impairments:  Increased fascial restricitons, Increased muscle spasms, Pain, Postural dysfunction, Hypermobility, Decreased mobility, Decreased range of motion, Decreased strength, Impaired flexibility, Decreased activity tolerance, Decreased endurance  Visit Diagnosis: Radiculopathy, lumbar region  Pain in right hip  Stiffness of right hip, not elsewhere classified  Muscle weakness (generalized)     Problem List Patient Active Problem List   Diagnosis Date Noted  .  Status post total replacement of right hip 09/08/2019  . Unilateral primary osteoarthritis, right hip 08/17/2019      Laureen Abrahams, PT, DPT 01/20/20 3:54 PM    [facility] [address] [place], [place], [address] Phone: [phone]   Fax:  [phone]  Name: Kathy Nguyen MRN: [id number] Date of Birth: 1965-05-10     PHYSICAL THERAPY DISCHARGE SUMMARY  Visits from Start of Care: 3  Current functional level related to goals /  functional outcomes: See above   Remaining deficits: See above   Education / Equipment: HEP  Plan: Patient agrees to discharge.  Patient goals were not met. Patient is being discharged due to not returning since the last visit.  ?????    Laureen Abrahams, PT, DPT 03/30/20 2:11 PM  [facility] [address] [place], [place], [address] Phone: [phone]   Fax:  [phone]

## 2020-01-26 ENCOUNTER — Other Ambulatory Visit: Payer: Self-pay

## 2020-01-26 ENCOUNTER — Encounter: Payer: Self-pay | Admitting: Orthopaedic Surgery

## 2020-01-26 ENCOUNTER — Ambulatory Visit (INDEPENDENT_AMBULATORY_CARE_PROVIDER_SITE_OTHER): Payer: 59 | Admitting: Orthopaedic Surgery

## 2020-01-26 DIAGNOSIS — Z96641 Presence of right artificial hip joint: Secondary | ICD-10-CM | POA: Diagnosis not present

## 2020-01-26 DIAGNOSIS — M5416 Radiculopathy, lumbar region: Secondary | ICD-10-CM

## 2020-01-26 DIAGNOSIS — M4807 Spinal stenosis, lumbosacral region: Secondary | ICD-10-CM | POA: Diagnosis not present

## 2020-01-26 NOTE — Progress Notes (Signed)
The patient comes in today to go over an MRI of her lumbar spine.  She does have a history of a right total hip arthroplasty that we did in June of this year.  She is still struggling getting after that surgery but has been dealing with the chronic low back pain but then developed a significant radicular component of his pain to the right side.  We sent her for an MRI at this point as we felt was appropriate to assess her lumbar spine.  MRIs reviewed with her and it does show a right-sided disc extrusion at L5-S1.  It looks like it is in very close proximity to the L5 nerve on that side.  I can put her through internal ex rotation with her right hip which is some mild discomfort.  She does have positive straight leg use of the right side and weakness in that leg.  I feel that she is a candidate for an epidural steroid injection.  She is also going to physical therapy already.  She can let therapist know that they can work on traction of lumbar spine.  We will see if Dr. Alvester Morin can set her up for an epidural steroid injection to the right side at L5-S1.  Hopefully this will help her symptoms significantly.  She is also dealing with a lot of anxiety depression as a relates to her pain and how she is doing overall.  She reports some hair loss.  She has talked her primary care physician about this.  She is in need of a therapist to help her work through some of her problems and she understands this as well.  That has not been easy for her to find.  Once Dr. Alvester Morin is able to provide an intervention for her lumbar spine, he can get her back to [place] about 2 to 3 weeks after that injection.

## 2020-02-01 ENCOUNTER — Telehealth: Payer: Self-pay

## 2020-02-01 NOTE — Telephone Encounter (Signed)
Patient called in saying she hasnt been contacted about epidural injection in her back . Wants to get sch asap.

## 2020-02-02 NOTE — Telephone Encounter (Signed)
[employer] PENDING.

## 2020-02-03 ENCOUNTER — Other Ambulatory Visit: Payer: Self-pay

## 2020-02-03 ENCOUNTER — Emergency Department (HOSPITAL_COMMUNITY)
Admission: EM | Admit: 2020-02-03 | Discharge: 2020-02-03 | Disposition: A | Discharge status: Home / Self Care | Payer: 59 | Attending: Emergency Medicine | Admitting: Emergency Medicine

## 2020-02-03 ENCOUNTER — Emergency Department (HOSPITAL_COMMUNITY): Payer: 59

## 2020-02-03 ENCOUNTER — Encounter (HOSPITAL_COMMUNITY): Payer: Self-pay | Admitting: Emergency Medicine

## 2020-02-03 DIAGNOSIS — R110 Nausea: Secondary | ICD-10-CM | POA: Insufficient documentation

## 2020-02-03 DIAGNOSIS — F439 Reaction to severe stress, unspecified: Secondary | ICD-10-CM

## 2020-02-03 DIAGNOSIS — F430 Acute stress reaction: Secondary | ICD-10-CM | POA: Diagnosis not present

## 2020-02-03 DIAGNOSIS — R079 Chest pain, unspecified: Secondary | ICD-10-CM | POA: Diagnosis present

## 2020-02-03 DIAGNOSIS — R0789 Other chest pain: Secondary | ICD-10-CM | POA: Insufficient documentation

## 2020-02-03 DIAGNOSIS — I10 Essential (primary) hypertension: Secondary | ICD-10-CM | POA: Diagnosis not present

## 2020-02-03 DIAGNOSIS — Z96641 Presence of right artificial hip joint: Secondary | ICD-10-CM | POA: Diagnosis not present

## 2020-02-03 DIAGNOSIS — M549 Dorsalgia, unspecified: Secondary | ICD-10-CM | POA: Diagnosis not present

## 2020-02-03 LAB — BASIC METABOLIC PANEL
Anion gap: 9 (ref 5–15)
BUN: 13 mg/dL (ref 6–20)
CO2: 27 mmol/L (ref 22–32)
Calcium: 9.8 mg/dL (ref 8.9–10.3)
Chloride: 102 mmol/L (ref 98–111)
Creatinine, Ser: 1.07 mg/dL — ABNORMAL HIGH (ref 0.44–1.00)
GFR, Estimated: >60 mL/min (ref >60)
Glucose, Bld: 98 mg/dL (ref 70–99)
Potassium: 3.7 mmol/L (ref 3.5–5.1)
Sodium: 138 mmol/L (ref 135–145)

## 2020-02-03 LAB — CBC
HCT: 40.7 % (ref 36.0–46.0)
Hemoglobin: 13 g/dL (ref 12.0–15.0)
MCH: 28.1 pg (ref 26.0–34.0)
MCHC: 31.9 g/dL (ref 30.0–36.0)
MCV: 88.1 fL (ref 80.0–100.0)
Platelets: 300 10*3/uL (ref 150–400)
RBC: 4.62 MIL/uL (ref 3.87–5.11)
RDW: 13.2 % (ref 11.5–15.5)
WBC: 5.7 10*3/uL (ref 4.0–10.5)
nRBC: 0 % (ref 0.0–0.2)

## 2020-02-03 LAB — TROPONIN I (HIGH SENSITIVITY)
Troponin I (High Sensitivity): 3 ng/L (ref <18)
Troponin I (High Sensitivity): 2 ng/L (ref <18)

## 2020-02-03 LAB — I-STAT BETA HCG BLOOD, ED (MC, WL, AP ONLY): I-stat hCG, quantitative: <5 m[IU]/mL (ref <5)

## 2020-02-03 NOTE — ED Provider Notes (Signed)
[facility] EMERGENCY DEPARTMENT Provider Note   CSN: [id number] Arrival date & time: 02/03/20  1227     History No chief complaint on file.   Kathy Nguyen is a 54 y.o. female.  Patient awoke this morning with intense left sided anterior chest pain, described as "someone reaching in to my chest and squeezing my heart". Pain radiated to the jaw. Mild nausea. 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 She has not been able to work. She endorses feelings of anxiety, depression and increased stress. Today's symptoms are not consistent with her prior anxiety attacks. Additional PMH of hypertension, GERD, arthritis.  The history is provided by the patient and medical records. No language interpreter was used.  Chest Pain Pain location:  L chest Pain quality: crushing and radiating   Pain radiates to:  L jaw Onset quality:  Sudden Progression:  Improving Chronicity:  New Associated symptoms: anxiety, back pain and nausea   Associated symptoms: no fever, no palpitations, no shortness of breath and no vomiting   Risk factors: hypertension and surgery        Past Medical History:  Diagnosis Date  . Anxiety   . Arthritis   . Depression   . Family history of adverse reaction to anesthesia    Father had a hard time awaking  . GERD (gastroesophageal reflux disease)   . Headache   . History of hiatal hernia   . Hypertension   . Pneumonia   . PONV (postoperative nausea and vomiting)     Patient Active Problem List   Diagnosis Date Noted  . Status post total replacement of right hip 09/08/2019  . Unilateral primary osteoarthritis, right hip 08/17/2019    Past Surgical History:  Procedure Laterality Date  . ABDOMINAL HYSTERECTOMY  2008  . 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 BREAST SURGERY  2008   Reduction  & Lift  . BREAST SURGERY     Implants  . TOTAL HIP ARTHROPLASTY Right 09/08/2019   Procedure: RIGHT TOTAL HIP ARTHROPLASTY ANTERIOR APPROACH;  Surgeon: [name], MD;  Location: [facility];  Service: Orthopedics;  Laterality: Right;  . VULVA / PERINEUM BIOPSY Right      OB History   No obstetric history on file.     No family history on file.  Social History   Tobacco Use  . Smoking status: Never Smoker  . Smokeless tobacco: Never Used  Vaping Use  . Vaping Use: Never used  Substance Use Topics  . Alcohol use: Not Currently  . Drug use: Never    Home Medications Prior to Admission medications   Medication Sig Start Date End Date Taking? Authorizing Provider  ALPRAZolam (XANAX) 0.25 MG tablet Take 0.25 mg by mouth 2 (two) times daily as needed for anxiety. 07/06/19   [provider]  buPROPion (WELLBUTRIN XL) 300 MG 24 hr tablet Take 300 mg by mouth daily. 08/14/19   [provider]  clindamycin (CLEOCIN) 150 MG capsule Take 2 po 1hr prior to procedure, and 2 po 6hr after dental appt 10/07/19   [name], MD  dicyclomine (BENTYL) 10 MG capsule Take 10 mg by mouth 3 (three) times daily as needed for spasms.    [provider]  EST ESTROGENS-METHYLTEST HS 0.625-1.25 MG tablet Take 1 tablet by mouth daily.  11/04/19   [provider]  estradiol (ESTRACE) 1 MG tablet Take 1 mg by mouth daily.    [provider]  gabapentin (NEURONTIN) 100 MG capsule Take 2 capsules (200 mg total) by mouth 3 (three) times daily. 12/07/19   Kirtland Bouchard, PA-C  loratadine (CLARITIN) 10 MG tablet Take 10 mg by mouth daily as needed for allergies.    [provider]  methocarbamol (ROBAXIN) 500 MG tablet Take 1 tablet (500 mg total) by mouth every 6 (six) hours as needed for muscle spasms. 12/07/19   Kirtland Bouchard, PA-C  olmesartan-hydrochlorothiazide (BENICAR HCT) 20-12.5 MG tablet Take 1 tablet by mouth every morning. 08/11/19    [provider]  ondansetron (ZOFRAN ODT) 4 MG disintegrating tablet Take 1 tablet (4 mg total) by mouth every 8 (eight) hours as needed for nausea or vomiting. 09/11/19   [name], MD  ondansetron (ZOFRAN) 4 MG tablet Take 4 mg by mouth as needed for nausea or vomiting.    [provider]  oxyCODONE (OXY IR/ROXICODONE) 5 MG immediate release tablet Take 1-2 tablets (5-10 mg total) by mouth every 4 (four) hours as needed for moderate pain (pain score 4-6). 09/22/19   Kirtland Bouchard, PA-C  polyethylene glycol (MIRALAX / GLYCOLAX) 17 g packet Take 17 g by mouth daily as needed for moderate constipation.    [provider]  TURMERIC CURCUMIN PO Take 1 capsule by mouth daily.    [provider]  valACYclovir (VALTREX) 500 MG tablet Take 500 mg by mouth daily as needed (outbreak).    [provider]    Allergies    Biaxin [clarithromycin] and Penicillins  Review of Systems   Review of Systems  Constitutional: Negative for fever.  Respiratory: Negative for shortness of breath.   Cardiovascular: Positive for chest pain. Negative for palpitations.  Gastrointestinal: Positive for nausea. Negative for vomiting.  Musculoskeletal: Positive for back pain.  All other systems reviewed and are negative.   Physical Exam Updated Vital Signs BP (!) 128/96 (BP Location: Right Arm)   Pulse 87   Temp 98.8 F (37.1 C) (Oral)   Resp 12   Ht 5\' 5"  (1.651 m)   Wt 74.4 kg   SpO2 97%   BMI 27.29 kg/m   Physical Exam Vitals and nursing note reviewed.  Constitutional:      Appearance: Normal appearance.  HENT:     Head: Normocephalic.     Nose: Nose normal.     Mouth/Throat:     Mouth: Mucous membranes are moist.  Eyes:     Conjunctiva/sclera: Conjunctivae normal.  Cardiovascular:     Rate and Rhythm: Normal rate and regular rhythm.  Pulmonary:     Effort: Pulmonary effort is normal.     Breath sounds: Normal breath sounds.   Musculoskeletal:        General: Swelling present.  Skin:    General: Skin is warm and dry.  Neurological:     Mental Status: She is alert and oriented to person, place, and time.  Psychiatric:        Mood and Affect: Mood normal.        Behavior: Behavior normal.     ED Results / Procedures / Treatments   Labs (all labs ordered are listed, but only abnormal results are displayed) Labs Reviewed  BASIC METABOLIC PANEL - Abnormal; Notable for the following components:      Result Value   Creatinine, Ser 1.07 (*)  All other components within normal limits  CBC  I-STAT BETA HCG BLOOD, ED ([facility], [facility], [facility] ONLY)  TROPONIN I (HIGH SENSITIVITY)  TROPONIN I (HIGH SENSITIVITY)    EKG EKG Interpretation  Date/Time:  Wednesday Kathy 27 2021 12:44:20 EDT Ventricular Rate:  89 PR Interval:  126 QRS Duration: 72 QT Interval:  372 QTC Calculation: 452 R Axis:   43 Text Interpretation: Normal sinus rhythm Normal ECG No significant change since last tracing Confirmed by Richardean Canal [phone]) on 02/03/2020 5:04:15 PM   Radiology DG Chest 2 View  Result Date: 02/03/2020 CLINICAL DATA:  LEFT chest pain that focal rectus morning, pain radiating to jaw, some nausea, history hypertension EXAM: CHEST - 2 VIEW COMPARISON:  05/21/2016 FINDINGS: Normal heart size, mediastinal contours, and pulmonary vascularity. Lungs clear. No infiltrate, pleural effusion, or pneumothorax. Osseous structures unremarkable. IMPRESSION: Normal exam. Electronically Signed   By: Ulyses Southward M.D.   On: 02/03/2020 13:31    Procedures Procedures (including critical care time)  Medications Ordered in ED Medications - No data to display  ED Course  I have reviewed the triage vital signs and the nursing notes.  Pertinent labs & imaging results that were available during my care of the patient were reviewed by me and considered in my medical decision making (see chart for details).    MDM Rules/Calculators/A&P                           Patient is to be discharged with recommendation to follow up with PCP in regards to today's hospital visit. Chest pain is not likely of cardiac or pulmonary etiology d/t presentation, perc negative, VSS, no tracheal deviation, no JVD or new murmur, RRR, breath sounds equal bilaterally, EKG without acute abnormalities, negative troponin, and negative CXR. Pt has been advised to return to the ED is CP becomes exertional, associated with diaphoresis or nausea, radiates to left jaw/arm, worsens or becomes concerning in any way. Pt appears reliable for follow up and is agreeable to discharge.   Case has been discussed with Dr. Silverio Lay who agrees with the above plan to discharge.  Final Clinical Impression(s) / ED Diagnoses Final diagnoses:  Other chest pain  Stress    Rx / DC Orders ED Discharge Orders    None       Felicie Morn, NP 02/03/20 1920    Charlynne Pander, MD 02/03/20 [phone]

## 2020-02-03 NOTE — ED Notes (Signed)
Pt assisted to [facility]

## 2020-02-03 NOTE — ED Triage Notes (Signed)
Pt here with c/o left side chest pain that woke her up this morning , radiates to the jaw, along with some nausea

## 2020-02-03 NOTE — Discharge Instructions (Addendum)
Please follow-up with your primary care provider concerning today's visit.  Your labs, CXR, and ECG are normal today and do not indicate a cardiac or pulmonary etiology of your chest pain. Please refer to attached instructions.

## 2020-02-04 NOTE — Telephone Encounter (Signed)
Called pt and sch 11/23

## 2020-02-05 ENCOUNTER — Telehealth: Payer: Self-pay | Admitting: Orthopaedic Surgery

## 2020-02-05 NOTE — Telephone Encounter (Signed)
Patient called, needs 10/19 ov note & MRI report faxed to her case worker at [facility], Jasmine December. I faxed [phone]

## 2020-02-15 ENCOUNTER — Telehealth: Payer: Self-pay | Admitting: Physical Medicine and Rehabilitation

## 2020-02-15 NOTE — Telephone Encounter (Signed)
Patient is already on wait list and I called to advise that we will call if we have any cancellations.

## 2020-02-15 NOTE — Telephone Encounter (Signed)
Patient called. She would like to be put on the wait list. Would like an earlier appointment. Her call back number is [phone]

## 2020-03-01 ENCOUNTER — Encounter: Payer: Self-pay | Admitting: Physical Medicine and Rehabilitation

## 2020-03-01 ENCOUNTER — Ambulatory Visit: Payer: Self-pay

## 2020-03-01 ENCOUNTER — Ambulatory Visit (INDEPENDENT_AMBULATORY_CARE_PROVIDER_SITE_OTHER): Payer: 59 | Admitting: Physical Medicine and Rehabilitation

## 2020-03-01 ENCOUNTER — Other Ambulatory Visit: Payer: Self-pay

## 2020-03-01 VITALS — BP 139/88 | HR 95

## 2020-03-01 DIAGNOSIS — M5116 Intervertebral disc disorders with radiculopathy, lumbar region: Secondary | ICD-10-CM

## 2020-03-01 DIAGNOSIS — M5416 Radiculopathy, lumbar region: Secondary | ICD-10-CM

## 2020-03-01 MED ORDER — METHYLPREDNISOLONE ACETATE 80 MG/ML IJ SUSP
80.0000 mg | Freq: Once | INTRAMUSCULAR | Status: AC
  Administered 2020-03-01: 80 mg

## 2020-03-01 NOTE — Progress Notes (Signed)
Pt state lower back pain on both side. Pt state walking, standing and sitting for a long time makes the pain worse. Pt state she use ice packs everyday and take pain meds to help ease the pain.  Numeric Pain Rating Scale and Functional Assessment Average Pain 4   In the last MONTH (on 0-10 scale) has pain interfered with the following?  1. General activity like being  able to carry out your everyday physical activities such as walking, climbing stairs, carrying groceries, or moving a chair?  Rating(10)   +Driver, -BT, -Dye Allergies.

## 2020-03-01 NOTE — Progress Notes (Signed)
Kathy Nguyen - 54 y.o. female MRN [id number]  Date of birth: [birth year]  Office Visit Note: Visit Date: 03/01/2020 PCP: Pcp, No Referred by: Kathy Nguyen*  Subjective: Chief Complaint  Patient presents with  . Lower Back - Pain   HPI:  Kathy Nguyen is a 54 y.o. female who comes in today at the request of Dr. Doneen Poisson for planned Right S1-2 Lumbar epidural steroid injection with fluoroscopic guidance.  The patient has failed conservative care including home exercise, medications, time and activity modification.  This injection will be diagnostic and hopefully therapeutic.  Please see requesting physician notes for further details and justification.   MRI reviewed with images and spine model.  MRI reviewed in the note below.   ROS Otherwise per HPI.  Assessment & Plan: Visit Diagnoses:  1. Lumbar radiculopathy   2. Radiculopathy due to lumbar intervertebral disc disorder     Plan: No additional findings.   Meds & Orders:  Meds ordered this encounter  Medications  . methylPREDNISolone acetate (DEPO-MEDROL) injection 80 mg    Orders Placed This Encounter  Procedures  . XR C-ARM NO REPORT  . Epidural Steroid injection    Follow-up: Return for visit to requesting physician as needed.   Procedures: No procedures performed  S1 Lumbosacral Transforaminal Epidural Steroid Injection - Sub-Pedicular Approach with Fluoroscopic Guidance   Patient: Kathy Nguyen      Date of Birth: 1966-03-30 MRN: [id number] PCP: Pcp, No      Visit Date: 03/01/2020   Universal Protocol:    Date/Time: 11/23/212:22 PM  Consent Given By: the patient  Position:  PRONE  Additional Comments: Vital signs were monitored before and after the procedure. Patient was prepped and draped in the usual sterile fashion. The correct patient, procedure, and site was verified.   Injection Procedure Details:  Procedure Site One Meds Administered:  Meds ordered this encounter   Medications  . methylPREDNISolone acetate (DEPO-MEDROL) injection 80 mg    Laterality: Right  Location/Site:  S1 Foramen   Needle size: 22 ga.  Needle type: Spinal  Needle Placement: Transforaminal  Findings:   -Comments: Excellent flow of contrast along the nerve and into the epidural space.   Procedure Details: After squaring off the sacral end-plate to get a true AP view, the C-arm was positioned so that the best possible view of the S1 foramen was visualized. The soft tissues overlying this structure were infiltrated with 2-3 ml. of 1% Lidocaine without Epinephrine.    The spinal needle was inserted toward the target using a "trajectory" view along the fluoroscope beam.  Under AP and lateral visualization, the needle was advanced so it did not puncture dura. Biplanar projections were used to confirm position. Aspiration was confirmed to be negative for CSF and/or blood. A 1-2 ml. volume of Isovue-250 was injected and flow of contrast was noted at each level. Radiographs were obtained for documentation purposes.   After attaining the desired flow of contrast documented above, a 0.5 to 1.0 ml test dose of 0.25% Marcaine was injected into each respective transforaminal space.  The patient was observed for 90 seconds post injection.  After no sensory deficits were reported, and normal lower extremity motor function was noted,   the above injectate was administered so that equal amounts of the injectate were placed at each foramen (level) into the transforaminal epidural space.   Additional Comments:  The patient tolerated the procedure well Dressing: Band-Aid with 2 x 2 sterile gauze  Post-procedure details: Patient was observed during the procedure. Post-procedure instructions were reviewed.  Patient left the clinic in stable condition.     Clinical History: MRI LUMBAR SPINE WITHOUT CONTRAST  TECHNIQUE: Multiplanar, multisequence MR imaging of the lumbar spine  was performed. No intravenous contrast was administered.  COMPARISON:  Lumbar spine radiographs 12/07/2019 and 11/04/2014  FINDINGS: Segmentation: 5 non rib-bearing lumbar type vertebral bodies are present. The lowest fully formed vertebral body is L5.  Alignment:  No significant listhesis is present.  Vertebrae: Schmorl's nodes are present along the inferior endplates of T11 and T12 and at the L1-2 disc space. Marrow signal and vertebral body heights are normal.  Conus medullaris and cauda equina: Conus extends to the L1 level. Conus and cauda equina appear normal.  Paraspinal and other soft tissues: Limited imaging the abdomen is unremarkable. There is no significant adenopathy. No solid organ lesions are present.  Disc levels:  T12-L1: Negative.  L1-2: Mild disc desiccation is present. No focal protrusion or stenosis is present.  L2-3: Negative.  L3-4: Lateral disc bulging extends into the foramina bilaterally. Mild facet hypertrophy is present bilaterally. No significant stenosis is present. L4-5: Lateral disc bulging is present. Far left annular tear is present. Mild facet hypertrophy is noted bilaterally. No focal stenosis is present.  L5-S1: A right paramedian disc extrusion extends 4 mm superior to the inferior endplate of L5. This contacts and slightly displaces the traversing right L5 nerve roots. Foramina are patent bilaterally.  IMPRESSION: 1. Right paramedian disc extrusion at L5-S1 contacts and slightly displaces the traversing right L5 nerve roots. 2. Lateral disc bulging and facet hypertrophy at L3-4 and L4-5 without significant stenosis. 3. Given the right lower extremity radiculopathy, the disc extrusion at L5-S1 contacts and slightly displaces the traversing right S1 nerve roots.   Electronically Signed   By: Marin Roberts M.D.   On: 01/19/2020 10:35     Objective:  VS:  HT:    WT:   BMI:     BP:139/88  HR:95bpm   TEMP: ( )  RESP:  Physical Exam Constitutional:      General: She is not in acute distress.    Appearance: Normal appearance. She is not ill-appearing.  HENT:     Head: Normocephalic and atraumatic.     Right Ear: External ear normal.     Left Ear: External ear normal.  Eyes:     Extraocular Movements: Extraocular movements intact.  Cardiovascular:     Rate and Rhythm: Normal rate.     Pulses: Normal pulses.  Musculoskeletal:     Right lower leg: No edema.     Left lower leg: No edema.     Comments: Patient has good distal strength with no pain over the greater trochanters.  No clonus or focal weakness.  Skin:    Findings: No erythema, lesion or rash.  Neurological:     General: No focal deficit present.     Mental Status: She is alert and oriented to person, place, and time.     Sensory: No sensory deficit.     Motor: No weakness or abnormal muscle tone.     Coordination: Coordination normal.  Psychiatric:        Mood and Affect: Mood normal.        Behavior: Behavior normal.      Imaging: No results found.

## 2020-03-01 NOTE — Procedures (Signed)
S1 Lumbosacral Transforaminal Epidural Steroid Injection - Sub-Pedicular Approach with Fluoroscopic Guidance   Patient: Kathy Nguyen      Date of Birth: 1965-08-26 MRN: [id number] PCP: Pcp, No      Visit Date: 03/01/2020   Universal Protocol:    Date/Time: 11/23/212:22 PM  Consent Given By: the patient  Position:  PRONE  Additional Comments: Vital signs were monitored before and after the procedure. Patient was prepped and draped in the usual sterile fashion. The correct patient, procedure, and site was verified.   Injection Procedure Details:  Procedure Site One Meds Administered:  Meds ordered this encounter  Medications  . methylPREDNISolone acetate (DEPO-MEDROL) injection 80 mg    Laterality: Right  Location/Site:  S1 Foramen   Needle size: 22 ga.  Needle type: Spinal  Needle Placement: Transforaminal  Findings:   -Comments: Excellent flow of contrast along the nerve and into the epidural space.   Procedure Details: After squaring off the sacral end-plate to get a true AP view, the C-arm was positioned so that the best possible view of the S1 foramen was visualized. The soft tissues overlying this structure were infiltrated with 2-3 ml. of 1% Lidocaine without Epinephrine.    The spinal needle was inserted toward the target using a "trajectory" view along the fluoroscope beam.  Under AP and lateral visualization, the needle was advanced so it did not puncture dura. Biplanar projections were used to confirm position. Aspiration was confirmed to be negative for CSF and/or blood. A 1-2 ml. volume of Isovue-250 was injected and flow of contrast was noted at each level. Radiographs were obtained for documentation purposes.   After attaining the desired flow of contrast documented above, a 0.5 to 1.0 ml test dose of 0.25% Marcaine was injected into each respective transforaminal space.  The patient was observed for 90 seconds post injection.  After no sensory deficits  were reported, and normal lower extremity motor function was noted,   the above injectate was administered so that equal amounts of the injectate were placed at each foramen (level) into the transforaminal epidural space.   Additional Comments:  The patient tolerated the procedure well Dressing: Band-Aid with 2 x 2 sterile gauze    Post-procedure details: Patient was observed during the procedure. Post-procedure instructions were reviewed.  Patient left the clinic in stable condition.

## 2020-03-22 ENCOUNTER — Ambulatory Visit (INDEPENDENT_AMBULATORY_CARE_PROVIDER_SITE_OTHER): Payer: 59 | Admitting: Orthopaedic Surgery

## 2020-03-22 DIAGNOSIS — M5416 Radiculopathy, lumbar region: Secondary | ICD-10-CM | POA: Diagnosis not present

## 2020-03-22 DIAGNOSIS — Z96641 Presence of right artificial hip joint: Secondary | ICD-10-CM | POA: Diagnosis not present

## 2020-03-22 NOTE — Progress Notes (Signed)
The patient is being seen in follow-up after having a right-sided epidural steroid injection at L5-S1 by Dr. Alvester Morin.  She says she is about 70% better.  That injection was 3 weeks ago.  She usually was in extreme pain and it did take her extreme pain away she states.  She still cannot squat due to her knee.  She has a history of a right total hip arthroplasty that we did in June of this year.  Her right hip moves smoothly and fluidly.  She has negative straight leg raise to the right side.  She does have problems getting in a squat position.  She has been to physical therapy for her back and knows to watch her bending without bending at the knees which is difficult.  Her knee exam shows no effusion and some tightness around her knee but no instability on exam.  She will continue to try to increase her activities as comfort allows.  I would like to see her back in 4 weeks to see how she is doing overall and would like a standing AP pelvis and a lateral of her right hip.

## 2020-04-19 ENCOUNTER — Ambulatory Visit (INDEPENDENT_AMBULATORY_CARE_PROVIDER_SITE_OTHER): Payer: 59 | Admitting: Orthopaedic Surgery

## 2020-04-19 ENCOUNTER — Other Ambulatory Visit: Payer: Self-pay

## 2020-04-19 ENCOUNTER — Encounter: Payer: Self-pay | Admitting: Orthopaedic Surgery

## 2020-04-19 ENCOUNTER — Ambulatory Visit (INDEPENDENT_AMBULATORY_CARE_PROVIDER_SITE_OTHER): Payer: 59

## 2020-04-19 DIAGNOSIS — Z96641 Presence of right artificial hip joint: Secondary | ICD-10-CM

## 2020-04-19 DIAGNOSIS — M4807 Spinal stenosis, lumbosacral region: Secondary | ICD-10-CM

## 2020-04-19 NOTE — Progress Notes (Signed)
The patient is well-known to me. 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 She is only 55 years old. She has most recently had an epidural steroid injection to the right at L5-S1 due to an S1 disc. Dr. Alvester Morin provided that injection at the end of November. That is helped her significantly. She is also seeing Dr. Wynetta Emery with neurosurgery who recommended that she continue to have some injections by Dr. Alvester Morin when her symptoms flareup and right now he would not recommend a discectomy as of yet. She has had more of her symptoms are now left-sided and not right-sided.  Her right hip moves smoothly and fluidly with no difficulty at all. She is still having problems squatting down on her knees but overall looks good. She has low back pain to the left side today.  MRI of her lumbar spine from last year showed some disc bulging to the left there was a far lateral annular tear at L4-L5 and this may be where she is having some of her symptoms. She is requesting to see Dr. Alvester Morin for an epidural injection at the left side. I think is reasonable to have an intervention at her left side at L4-L5 given her symptoms. We will work on setting that appointment. Dr. Alvester Morin can then get her back to me several weeks later. She states that Dr. Wynetta Emery would eventually see her back as well. For now, we will hold off on a right-sided L5-S1 injection unless she becomes more symptomatic.

## 2020-05-16 ENCOUNTER — Encounter: Payer: Self-pay | Admitting: Physical Medicine and Rehabilitation

## 2020-05-16 ENCOUNTER — Other Ambulatory Visit: Payer: Self-pay

## 2020-05-16 ENCOUNTER — Ambulatory Visit (INDEPENDENT_AMBULATORY_CARE_PROVIDER_SITE_OTHER): Payer: 59 | Admitting: Physical Medicine and Rehabilitation

## 2020-05-16 ENCOUNTER — Ambulatory Visit: Payer: Self-pay

## 2020-05-16 VITALS — BP 140/93 | HR 97

## 2020-05-16 DIAGNOSIS — M5416 Radiculopathy, lumbar region: Secondary | ICD-10-CM | POA: Diagnosis not present

## 2020-05-16 MED ORDER — METHYLPREDNISOLONE ACETATE 80 MG/ML IJ SUSP
80.0000 mg | Freq: Once | INTRAMUSCULAR | Status: AC
  Administered 2020-05-16: 80 mg

## 2020-05-16 NOTE — Progress Notes (Signed)
Pt state lower back pain that travels down her left leg.. Pt state standing for a long time makes the pain worse. Pt state she take pain meds and use ice/ heat to help ease the pain. Pt has hx of inj on 03/01/20 pt state it work with 70% pain free.  Numeric Pain Rating Scale and Functional Assessment Average Pain 7   In the last MONTH (on 0-10 scale) has pain interfered with the following?  1. General activity like being  able to carry out your everyday physical activities such as walking, climbing stairs, carrying groceries, or moving a chair?  Rating(10.)   +Driver, -BT, -Dye Allergies.

## 2020-05-16 NOTE — Progress Notes (Signed)
Kathy Nguyen - 55 y.o. female MRN [id number]  Date of birth: 1965/05/21  Office Visit Note: Visit Date: 05/16/2020 PCP: Pcp, No Referred by: [name]*  Subjective: Chief Complaint  Patient presents with  . Lower Back - Pain  . Left Leg - Pain   HPI:  Kathy Nguyen is a 55 y.o. female who comes in today at the request of Dr. Doneen Poisson for planned Left S1-2 Lumbar epidural steroid injection with fluoroscopic guidance.  The patient has failed conservative care including home exercise, medications, time and activity modification.  This injection will be diagnostic and hopefully therapeutic.  Please see requesting physician notes for further details and justification.   ROS Otherwise per HPI.  Assessment & Plan: Visit Diagnoses:    ICD-10-CM   1. Lumbar radiculopathy  M54.16 XR C-ARM NO REPORT    Epidural Steroid injection    methylPREDNISolone acetate (DEPO-MEDROL) injection 80 mg    Plan: No additional findings.   Meds & Orders:  Meds ordered this encounter  Medications  . methylPREDNISolone acetate (DEPO-MEDROL) injection 80 mg    Orders Placed This Encounter  Procedures  . XR C-ARM NO REPORT  . Epidural Steroid injection    Follow-up: Return for visit to requesting physician as needed.   Procedures: No procedures performed  S1 Lumbosacral Transforaminal Epidural Steroid Injection - Sub-Pedicular Approach with Fluoroscopic Guidance   Patient: Kathy Nguyen      Date of Birth: [birth year] MRN: [id number] PCP: Pcp, No      Visit Date: 05/16/2020   Universal Protocol:    Date/Time: 02/07/222:16 PM  Consent Given By: the patient  Position:  PRONE  Additional Comments: Vital signs were monitored before and after the procedure. Patient was prepped and draped in the usual sterile fashion. The correct patient, procedure, and site was verified.   Injection Procedure Details:  Procedure Site One Meds Administered:  Meds ordered this  encounter  Medications  . methylPREDNISolone acetate (DEPO-MEDROL) injection 80 mg    Laterality: Left  Location/Site:  S1 Foramen   Needle size: 22 ga.  Needle type: Spinal  Needle Placement: Transforaminal  Findings:   -Comments: Excellent flow of contrast along the nerve, nerve root and into the epidural space.  Epidurogram: Contrast epidurogram showed no nerve root cut off or restricted flow pattern.  Procedure Details: After squaring off the sacral end-plate to get a true AP view, the C-arm was positioned so that the best possible view of the S1 foramen was visualized. The soft tissues overlying this structure were infiltrated with 2-3 ml. of 1% Lidocaine without Epinephrine.    The spinal needle was inserted toward the target using a "trajectory" view along the fluoroscope beam.  Under AP and lateral visualization, the needle was advanced so it did not puncture dura. Biplanar projections were used to confirm position. Aspiration was confirmed to be negative for CSF and/or blood. A 1-2 ml. volume of Isovue-250 was injected and flow of contrast was noted at each level. Radiographs were obtained for documentation purposes.   After attaining the desired flow of contrast documented above, a 0.5 to 1.0 ml test dose of 0.25% Marcaine was injected into each respective transforaminal space.  The patient was observed for 90 seconds post injection.  After no sensory deficits were reported, and normal lower extremity motor function was noted,   the above injectate was administered so that equal amounts of the injectate were placed at each foramen (level) into the transforaminal epidural  space.   Additional Comments:  The patient tolerated the procedure well Dressing: Band-Aid with 2 x 2 sterile gauze    Post-procedure details: Patient was observed during the procedure. Post-procedure instructions were reviewed.  Patient left the clinic in stable condition.     Clinical  History: MRI LUMBAR SPINE WITHOUT CONTRAST  TECHNIQUE: Multiplanar, multisequence MR imaging of the lumbar spine was performed. No intravenous contrast was administered.  COMPARISON:  Lumbar spine radiographs 12/07/2019 and 11/04/2014  FINDINGS: Segmentation: 5 non rib-bearing lumbar type vertebral bodies are present. The lowest fully formed vertebral body is L5.  Alignment:  No significant listhesis is present.  Vertebrae: Schmorl's nodes are present along the inferior endplates of T11 and T12 and at the L1-2 disc space. Marrow signal and vertebral body heights are normal.  Conus medullaris and cauda equina: Conus extends to the L1 level. Conus and cauda equina appear normal.  Paraspinal and other soft tissues: Limited imaging the abdomen is unremarkable. There is no significant adenopathy. No solid organ lesions are present.  Disc levels:  T12-L1: Negative.  L1-2: Mild disc desiccation is present. No focal protrusion or stenosis is present.  L2-3: Negative.  L3-4: Lateral disc bulging extends into the foramina bilaterally. Mild facet hypertrophy is present bilaterally. No significant stenosis is present. L4-5: Lateral disc bulging is present. Far left annular tear is present. Mild facet hypertrophy is noted bilaterally. No focal stenosis is present.  L5-S1: A right paramedian disc extrusion extends 4 mm superior to the inferior endplate of L5. This contacts and slightly displaces the traversing right L5 nerve roots. Foramina are patent bilaterally.  IMPRESSION: 1. Right paramedian disc extrusion at L5-S1 contacts and slightly displaces the traversing right L5 nerve roots. 2. Lateral disc bulging and facet hypertrophy at L3-4 and L4-5 without significant stenosis. 3. Given the right lower extremity radiculopathy, the disc extrusion at L5-S1 contacts and slightly displaces the traversing right S1 nerve roots.   Electronically Signed   By:  Marin Roberts M.D.   On: 01/19/2020 10:35     Objective:  VS:  HT:    WT:   BMI:     BP:(!) 140/93  HR:97bpm  TEMP: ( )  RESP:  Physical Exam Vitals and nursing note reviewed.  Constitutional:      General: She is not in acute distress.    Appearance: Normal appearance. She is not ill-appearing.  HENT:     Head: Normocephalic and atraumatic.     Right Ear: External ear normal.     Left Ear: External ear normal.  Eyes:     Extraocular Movements: Extraocular movements intact.  Cardiovascular:     Rate and Rhythm: Normal rate.     Pulses: Normal pulses.  Pulmonary:     Effort: Pulmonary effort is normal. No respiratory distress.  Abdominal:     General: There is no distension.     Palpations: Abdomen is soft.  Musculoskeletal:        General: Tenderness present.     Cervical back: Neck supple.     Right lower leg: No edema.     Left lower leg: No edema.     Comments: Patient has good distal strength with no pain over the greater trochanters.  No clonus or focal weakness.  Skin:    Findings: No erythema, lesion or rash.  Neurological:     General: No focal deficit present.     Mental Status: She is alert and oriented to person, place, and time.  Sensory: No sensory deficit.     Motor: No weakness or abnormal muscle tone.     Coordination: Coordination normal.  Psychiatric:        Mood and Affect: Mood normal.        Behavior: Behavior normal.      Imaging: No results found.

## 2020-05-16 NOTE — Procedures (Signed)
S1 Lumbosacral Transforaminal Epidural Steroid Injection - Sub-Pedicular Approach with Fluoroscopic Guidance   Patient: Kathy Nguyen      Date of Birth: 1966-02-27 MRN: [id number] PCP: Pcp, No      Visit Date: 05/16/2020   Universal Protocol:    Date/Time: 02/07/222:16 PM  Consent Given By: the patient  Position:  PRONE  Additional Comments: Vital signs were monitored before and after the procedure. Patient was prepped and draped in the usual sterile fashion. The correct patient, procedure, and site was verified.   Injection Procedure Details:  Procedure Site One Meds Administered:  Meds ordered this encounter  Medications  . methylPREDNISolone acetate (DEPO-MEDROL) injection 80 mg    Laterality: Left  Location/Site:  S1 Foramen   Needle size: 22 ga.  Needle type: Spinal  Needle Placement: Transforaminal  Findings:   -Comments: Excellent flow of contrast along the nerve, nerve root and into the epidural space.  Epidurogram: Contrast epidurogram showed no nerve root cut off or restricted flow pattern.  Procedure Details: After squaring off the sacral end-plate to get a true AP view, the C-arm was positioned so that the best possible view of the S1 foramen was visualized. The soft tissues overlying this structure were infiltrated with 2-3 ml. of 1% Lidocaine without Epinephrine.    The spinal needle was inserted toward the target using a "trajectory" view along the fluoroscope beam.  Under AP and lateral visualization, the needle was advanced so it did not puncture dura. Biplanar projections were used to confirm position. Aspiration was confirmed to be negative for CSF and/or blood. A 1-2 ml. volume of Isovue-250 was injected and flow of contrast was noted at each level. Radiographs were obtained for documentation purposes.   After attaining the desired flow of contrast documented above, a 0.5 to 1.0 ml test dose of 0.25% Marcaine was injected into each respective  transforaminal space.  The patient was observed for 90 seconds post injection.  After no sensory deficits were reported, and normal lower extremity motor function was noted,   the above injectate was administered so that equal amounts of the injectate were placed at each foramen (level) into the transforaminal epidural space.   Additional Comments:  The patient tolerated the procedure well Dressing: Band-Aid with 2 x 2 sterile gauze    Post-procedure details: Patient was observed during the procedure. Post-procedure instructions were reviewed.  Patient left the clinic in stable condition.

## 2020-05-16 NOTE — Patient Instructions (Signed)

## 2020-06-01 ENCOUNTER — Ambulatory Visit: Payer: 59 | Admitting: Orthopaedic Surgery

## 2020-06-08 ENCOUNTER — Ambulatory Visit (INDEPENDENT_AMBULATORY_CARE_PROVIDER_SITE_OTHER): Payer: 59 | Admitting: Orthopaedic Surgery

## 2020-06-08 ENCOUNTER — Encounter: Payer: Self-pay | Admitting: Orthopaedic Surgery

## 2020-06-08 DIAGNOSIS — M4807 Spinal stenosis, lumbosacral region: Secondary | ICD-10-CM

## 2020-06-08 NOTE — Progress Notes (Signed)
The patient is a 55 year old well-known to me.  We replaced her right hip injection June of last year.  She has been having worsening severe left hip pain.  She is already had epidural steroid injection by Dr. Alvester Morin just a month ago.  She unfortunately has lost both parents to death this past month as well and has been a stressor for her.  She is significantly depressed and sad for what all is going on with her.  With that being said, she still having significant left hip pain.  Her left hip hurts with internal and external rotation.  Previous x-rays show some narrowing of the joint space but not severe.  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  At this point a MRI of her left hip is warranted to assess the cartilage given amount of pain she is having.  She is also tried failed conservative treatment.  We will see her back once we have the MRI of her left hip to better assess the joint.

## 2020-06-09 ENCOUNTER — Other Ambulatory Visit: Payer: Self-pay

## 2020-06-09 DIAGNOSIS — M25552 Pain in left hip: Secondary | ICD-10-CM

## 2020-06-30 ENCOUNTER — Ambulatory Visit
Admission: RE | Admit: 2020-06-30 | Discharge: 2020-06-30 | Disposition: A | Discharge status: Home / Self Care | Payer: 59 | Source: Ambulatory Visit | Attending: Orthopaedic Surgery | Admitting: Orthopaedic Surgery

## 2020-06-30 DIAGNOSIS — M25552 Pain in left hip: Secondary | ICD-10-CM

## 2020-07-05 ENCOUNTER — Ambulatory Visit (INDEPENDENT_AMBULATORY_CARE_PROVIDER_SITE_OTHER): Payer: 59 | Admitting: Orthopaedic Surgery

## 2020-07-05 ENCOUNTER — Encounter: Payer: Self-pay | Admitting: Orthopaedic Surgery

## 2020-07-05 DIAGNOSIS — M25552 Pain in left hip: Secondary | ICD-10-CM | POA: Diagnosis not present

## 2020-07-05 NOTE — Progress Notes (Signed)
The patient comes in for follow-up after having a MRI of her left hip.  We replaced her right hip in June 2021.  She is also seeing a neurosurgeon for spine issues.  She has dealt with significant depression and mental health issues as well.  This is keeping her from being able to participate in any type of meaningful labor as well.  If she sits for any amount of time and gets up to go she has significant stiffness in all of her joints and pain.  She is also been dealing with the loss of both parents and getting their estate closed.  We sent her for an MRI of her left hip due to similar pain that she was starting to experience with that hip joint.  The MRI of the left hip does show some changes in cartilage with some partial cartilage loss and a slight cystic change in the anterior superior acetabular area consistent with some degenerative labral tearing as well.  I compared this to an MRI last year which does show some progression of arthritis.  There is also tendinosis of the gluteus tendons with no tear.  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  On exam she has full and fluid range of motion of her left hip with some mild to moderate pain in the groin area.  I would like to send her to Dr. Prince Rome for an ultrasound-guided intra-articular injection of a steroid in her left hip joint.  She would like this treatment plan as well because she wants to avoid any type of surgical intervention for now given the combination of her disabling health issues.  With that being said, she is on disability this standpoint and cannot perform any type of manual or sedentary labor due to physical and orthopedic issues combined with mental health issues.  She sees a therapist on a regular basis and is on medications as a relates to her severe anxiety and depression.  She continues to be seen by a mental health specialist/therapist.  I would like to see her back in 6 weeks to see how she is doing overall.  I  do not anticipate her being able to participate in any type of labor the next 6 months to over a year.  She may end up eventually needing hip replacement surgery on the left hip.  We had a long thorough discussion about this.  All question concerns were answered and addressed.  We will reevaluate her in 6 weeks

## 2020-07-07 ENCOUNTER — Telehealth: Payer: Self-pay

## 2020-07-07 NOTE — Telephone Encounter (Signed)
Pt called stating that she will pick up her papers tomorrow.  And she said she forgot to ask Dr. Magnus Ivan about her handicap placard and if we can get that form for her tomorrow that she needs to take to the [facility]

## 2020-07-08 ENCOUNTER — Encounter: Payer: Self-pay | Admitting: Family Medicine

## 2020-07-08 ENCOUNTER — Ambulatory Visit: Payer: Self-pay

## 2020-07-08 ENCOUNTER — Other Ambulatory Visit: Payer: Self-pay

## 2020-07-08 ENCOUNTER — Ambulatory Visit (INDEPENDENT_AMBULATORY_CARE_PROVIDER_SITE_OTHER): Payer: 59 | Admitting: Family Medicine

## 2020-07-08 DIAGNOSIS — M5126 Other intervertebral disc displacement, lumbar region: Secondary | ICD-10-CM

## 2020-07-08 DIAGNOSIS — M25552 Pain in left hip: Secondary | ICD-10-CM | POA: Diagnosis not present

## 2020-07-08 NOTE — Telephone Encounter (Signed)
Placard application given to patient at ov today.

## 2020-07-08 NOTE — Progress Notes (Signed)
Subjective: Patient is here for ultrasound-guided intra-articular left hip injection.   She has mild to moderate degenerative changes on MRI scan, with anterior and lateral pain.  Objective: Good range of motion still but pain with passive flexion and internal rotation.  Procedure: Ultrasound guided injection is preferred based studies that show increased duration, increased effect, greater accuracy, decreased procedural pain, increased response rate, and decreased cost with ultrasound guided versus blind injection.   Verbal informed consent obtained.  Time-out conducted.  Noted no overlying erythema, induration, or other signs of local infection. Ultrasound-guided left hip injection: After sterile prep with Betadine, injected 4 cc 0.25% bupivacaine without epinephrine and 6 mg betamethasone using a 22-gauge spinal needle, passing the needle through the iliofemoral ligament into the femoral head/neck junction.  Injectate seen filling the joint capsule.  Modest improvement during the anesthetic phase.  We will refer her back to physical therapy and she will also work on adopting an anti-inflammatory diet.

## 2020-07-08 NOTE — Telephone Encounter (Signed)
Has an appointment at 10:20 for a hip injection. Can she get a handicap placard application while here?

## 2020-07-08 NOTE — Telephone Encounter (Signed)
Printed

## 2020-07-13 ENCOUNTER — Other Ambulatory Visit: Payer: Self-pay | Admitting: Orthopaedic Surgery

## 2020-08-01 ENCOUNTER — Ambulatory Visit: Payer: 59 | Admitting: Physical Therapy

## 2020-08-10 ENCOUNTER — Ambulatory Visit: Payer: 59 | Admitting: Physical Therapy

## 2020-08-11 ENCOUNTER — Other Ambulatory Visit: Payer: Self-pay | Admitting: Neurosurgery

## 2020-08-11 DIAGNOSIS — M5126 Other intervertebral disc displacement, lumbar region: Secondary | ICD-10-CM

## 2020-08-14 ENCOUNTER — Other Ambulatory Visit: Payer: Self-pay

## 2020-08-14 ENCOUNTER — Ambulatory Visit
Admission: RE | Admit: 2020-08-14 | Discharge: 2020-08-14 | Disposition: A | Discharge status: Home / Self Care | Payer: 59 | Source: Ambulatory Visit | Attending: Neurosurgery | Admitting: Neurosurgery

## 2020-08-14 DIAGNOSIS — M5126 Other intervertebral disc displacement, lumbar region: Secondary | ICD-10-CM

## 2020-08-15 ENCOUNTER — Encounter: Payer: Self-pay | Admitting: Orthopaedic Surgery

## 2020-08-15 ENCOUNTER — Ambulatory Visit (INDEPENDENT_AMBULATORY_CARE_PROVIDER_SITE_OTHER): Payer: 59 | Admitting: Orthopaedic Surgery

## 2020-08-15 DIAGNOSIS — M25552 Pain in left hip: Secondary | ICD-10-CM | POA: Diagnosis not present

## 2020-08-15 NOTE — Progress Notes (Signed)
The patient comes in for follow-up after having a steroid injection under ultrasound in her left hip joint.  This was by Dr. Prince Rome.  We did replace her right hip last year.  Overall she says she is doing much better.  She is wearing boots with heels for the first time in a long period of time and has decreased pain.  She did have an MRI of her lumbar spine yesterday and her neurosurgeon can to go over that with her.  She does feel like another injection would be worthwhile at some point in her left hip but I told her that needs to be held off for at least 4 to 5 months.  Her left hip does move smoothly and fluidly with minimal discomfort and pain.  Her right operative hip also moves smoothly.  My standpoint I will need to see her back for 3 months.  At that visit I would just like a standing low AP pelvis.  All questions and concerns were answered and addressed.

## 2020-08-16 ENCOUNTER — Ambulatory Visit: Payer: 59 | Admitting: Orthopaedic Surgery

## 2020-09-21 DIAGNOSIS — G43909 Migraine, unspecified, not intractable, without status migrainosus: Secondary | ICD-10-CM | POA: Diagnosis not present

## 2020-09-21 DIAGNOSIS — Z6827 Body mass index (BMI) 27.0-27.9, adult: Secondary | ICD-10-CM | POA: Diagnosis not present

## 2020-09-21 DIAGNOSIS — F418 Other specified anxiety disorders: Secondary | ICD-10-CM | POA: Diagnosis not present

## 2020-09-27 ENCOUNTER — Telehealth: Payer: Self-pay

## 2020-09-27 NOTE — Telephone Encounter (Signed)
Left S1 TF on 05/16/20. Ok to repeat if helped, same problem/side, and no new injury? [facility]

## 2020-09-27 NOTE — Telephone Encounter (Signed)
Patient called she stated she updated her insurance coverage on mychart she now has [employer] call back:[phone]

## 2020-09-27 NOTE — Telephone Encounter (Signed)
Patient states that she hurts on both sides again. Right was done in 2021. Patient now has Medicaid. She will upload the information in MyChart so we can submit for auth.

## 2020-09-27 NOTE — Telephone Encounter (Signed)
Needs auth and scheduling for bilateral S1 TF. [facility] medicaid card uploaded into media tab.

## 2020-09-27 NOTE — Telephone Encounter (Signed)
Patient called she is requesting a appointment with Dr.Newton for injections call back:[phone]

## 2020-10-06 ENCOUNTER — Telehealth: Payer: Self-pay | Admitting: Physical Medicine and Rehabilitation

## 2020-10-06 NOTE — Telephone Encounter (Signed)
I have never called this patient. Your last note says pending.

## 2020-10-06 NOTE — Telephone Encounter (Signed)
Received call from [facility] with [facility] she advised the prior Berkley Harvey was denied    Auth# [id number]   It denied for lack of clical information. The appeal letter will be sent out.  If you want to do a Peer to Peer the phone # is [phone]    Kathy Nguyen asked if the patient can be notified

## 2020-10-06 NOTE — Telephone Encounter (Signed)
Pt called stating she missed a call from [place], that she believes was in regards to her pre autho . Pt would like a CB to discuss further.   [phone]

## 2020-10-12 ENCOUNTER — Telehealth: Payer: Self-pay

## 2020-10-12 NOTE — Telephone Encounter (Signed)
Patient called regarding referral for a injection with Dr.Newton patient stated her insurance denied her injection due to needing notes,test results and plan of care call back:[phone]

## 2020-10-12 NOTE — Telephone Encounter (Signed)
Holding for [place] 

## 2020-10-20 ENCOUNTER — Telehealth: Payer: Self-pay | Admitting: Physical Medicine and Rehabilitation

## 2020-10-20 NOTE — Telephone Encounter (Signed)
Pt called wanting to check on the status off getting her inj approved. I did let the pt know as of 10/18/20 it was still processing and she just wanted to make sure our office will call her as soon as we hear anything back.   [phone]

## 2020-10-24 ENCOUNTER — Telehealth: Payer: Self-pay | Admitting: Physical Medicine and Rehabilitation

## 2020-10-24 NOTE — Telephone Encounter (Signed)
Pt calling to ask if Dr. Alvester Morin had any cancellations for today or this week to get her inj sooner. The best call back number is [phone].

## 2020-10-31 ENCOUNTER — Ambulatory Visit: Payer: Self-pay

## 2020-10-31 ENCOUNTER — Other Ambulatory Visit: Payer: Self-pay

## 2020-10-31 ENCOUNTER — Encounter: Payer: Self-pay | Admitting: Physical Medicine and Rehabilitation

## 2020-10-31 ENCOUNTER — Ambulatory Visit (INDEPENDENT_AMBULATORY_CARE_PROVIDER_SITE_OTHER): Payer: Medicaid Other | Admitting: Physical Medicine and Rehabilitation

## 2020-10-31 VITALS — BP 122/88 | HR 101

## 2020-10-31 DIAGNOSIS — M5416 Radiculopathy, lumbar region: Secondary | ICD-10-CM | POA: Diagnosis not present

## 2020-10-31 DIAGNOSIS — M5116 Intervertebral disc disorders with radiculopathy, lumbar region: Secondary | ICD-10-CM

## 2020-10-31 MED ORDER — BETAMETHASONE SOD PHOS & ACET 6 (3-3) MG/ML IJ SUSP
12.0000 mg | Freq: Once | INTRAMUSCULAR | Status: AC
  Administered 2020-10-31: 12 mg

## 2020-10-31 NOTE — Progress Notes (Signed)
Pt state lower back pain that travels down both legs. Pt state walking, standing and sitting makes the pain worse. Pt state she takes pain meds  and uses ice to help ease ease her pain. Pt has hx of inj on 05/16/20 pt state it help for a week.  Numeric Pain Rating Scale and Functional Assessment Average Pain 6   In the last MONTH (on 0-10 scale) has pain interfered with the following?  1. General activity like being  able to carry out your everyday physical activities such as walking, climbing stairs, carrying groceries, or moving a chair?  Rating(10)   +Driver, -BT, -Dye Allergies.

## 2020-10-31 NOTE — Patient Instructions (Signed)

## 2020-11-02 NOTE — Progress Notes (Signed)
Kathy Nguyen - 55 y.o. female MRN [id number]  Date of birth: 1965-12-19  Office Visit Note: Visit Date: 10/31/2020 PCP: Pcp, No Referred by: No ref. provider found  Subjective: Chief Complaint  Patient presents with   Lower Back - Pain   HPI:  Kathy Nguyen is a 55 y.o. female who comes in today For planned bilateral S1 transforaminal epidural steroid injection.  Patient had prior injection with good relief.  Prior MRI in 2021 showed right disc extrusion at L5-S1 with impacting the right S1 nerve root.  Since that time she has been followed by Dr. Donalee Citrin at [facility].  Per the patient he recommends anterior discectomy and fusion.  She has had new MRI since I have seen and I reviewed this with her today.  There is central disc herniation with some lateral recess narrowing bilaterally right more than the left.  She talks today about paresthesias in both legs although this has been a chronic long-term problem with her but worsened now and also bladder control problems.  I did reassure her from a lumbar spine perspective her condition would not cause bladder issues.  She would need to follow-up with Dr. Wynetta Emery and/or a urologist if she were having consistent issues with that.  Unfortunately she has some psychiatric or psychological overlay of her pain do to history of depression but now with the loss of 2 family members recently and since have seen her.  She does see someone for depression with counseling.  I do think she would benefit from pain psychology with coping strategies etc.  ROS Otherwise per HPI.  Assessment & Plan: Visit Diagnoses:    ICD-10-CM   1. Radiculopathy due to lumbar intervertebral disc disorder  M51.16 XR C-ARM NO REPORT    Epidural Steroid injection    betamethasone acetate-betamethasone sodium phosphate (CELESTONE) injection 12 mg    2. Lumbar radiculopathy  M54.16 XR C-ARM NO REPORT    Epidural Steroid injection    betamethasone  acetate-betamethasone sodium phosphate (CELESTONE) injection 12 mg      Plan: No additional findings.   Meds & Orders:  Meds ordered this encounter  Medications   betamethasone acetate-betamethasone sodium phosphate (CELESTONE) injection 12 mg    Orders Placed This Encounter  Procedures   XR C-ARM NO REPORT   Epidural Steroid injection    Follow-up: No follow-ups on file.   Procedures: No procedures performed  S1 Lumbosacral Transforaminal Epidural Steroid Injection - Sub-Pedicular Approach with Fluoroscopic Guidance   Patient: Kathy Nguyen      Date of Birth: 02/12/1966 MRN: [id number] PCP: Pcp, No      Visit Date: 10/31/2020   Universal Protocol:    Date/Time: 07/27/226:26 AM  Consent Given By: the patient  Position:  PRONE  Additional Comments: Vital signs were monitored before and after the procedure. Patient was prepped and draped in the usual sterile fashion. The correct patient, procedure, and site was verified.   Injection Procedure Details:  Procedure Site One Meds Administered:  Meds ordered this encounter  Medications   betamethasone acetate-betamethasone sodium phosphate (CELESTONE) injection 12 mg    Laterality: Bilateral  Location/Site:  S1 Foramen   Needle size: 22 ga.  Needle type: Spinal  Needle Placement: Transforaminal  Findings:   -Comments: Excellent flow of contrast along the nerve, nerve root and into the epidural space.  Epidurogram: Contrast epidurogram showed no nerve root cut off or restricted flow pattern.  Procedure Details: After squaring  off the sacral end-plate to get a true AP view, the C-arm was positioned so that the best possible view of the S1 foramen was visualized. The soft tissues overlying this structure were infiltrated with 2-3 ml. of 1% Lidocaine without Epinephrine.    The spinal needle was inserted toward the target using a "trajectory" view along the fluoroscope beam.  Under AP and lateral visualization,  the needle was advanced so it did not puncture dura. Biplanar projections were used to confirm position. Aspiration was confirmed to be negative for CSF and/or blood. A 1-2 ml. volume of Isovue-250 was injected and flow of contrast was noted at each level. Radiographs were obtained for documentation purposes.   After attaining the desired flow of contrast documented above, a 0.5 to 1.0 ml test dose of 0.25% Marcaine was injected into each respective transforaminal space.  The patient was observed for 90 seconds post injection.  After no sensory deficits were reported, and normal lower extremity motor function was noted,   the above injectate was administered so that equal amounts of the injectate were placed at each foramen (level) into the transforaminal epidural space.   Additional Comments:  The patient tolerated the procedure well Dressing: Band-Aid with 2 x 2 sterile gauze    Post-procedure details: Patient was observed during the procedure. Post-procedure instructions were reviewed.  Patient left the clinic in stable condition.   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 Additional history provided by scanning technologist: Patient reports weakness, pain and numbness in bilateral legs for years.   EXAM: MRI LUMBAR SPINE WITHOUT CONTRAST   TECHNIQUE: Multiplanar, multisequence MR imaging of the lumbar spine was performed. No intravenous contrast was administered.   COMPARISON:  Lumbar spine MRI 01/19/2020. lumbar spine radiographs 12/07/2019.   FINDINGS: Segmentation: 5 lumbar vertebrae. The caudal most well-formed intervertebral disc space is designated L5-S1.   Alignment: Straightening of the expected lumbar lordosis. No significant spondylolisthesis.   Vertebrae: Multilevel small Schmorl nodes. Vertebral body height is otherwise maintained. Trace degenerative edema within the posterior elements on the left at L5. Elsewhere, no significant marrow  edema or focal suspicious osseous lesion is identified.   Conus medullaris and cauda equina: Conus extends to the L1 level. No signal abnormality within the visualized distal spinal cord.   Paraspinal and other soft tissues: Small right renal cyst. Paraspinal soft tissues within normal limits.   Disc levels:   Unless otherwise stated, the level by level findings below have not significantly changed since prior MRI 01/19/2020.   Moderate disc degeneration at L5-S1. No more than mild disc degeneration at the remaining levels.   T12-L1: No significant disc herniation or stenosis. Perineural cyst within the right neural foramen.   L1-L2: Disc bulge with endplate spurring. Mild facet arthrosis. No significant spinal canal or foraminal stenosis.   L2-L3: Minimal facet arthrosis. No significant disc herniation or stenosis.   L3-L4: Disc bulge. Mild facet arthrosis/ligamentum flavum hypertrophy. No significant spinal canal or foraminal stenosis.   L4-L5: Disc bulge. Superimposed tiny left center disc protrusion (series 6, image 32). Mild facet arthrosis. No significant spinal canal or foraminal stenosis.   L5-S1: Disc bulge with endplate spurring. Superimposed broad-based central disc protrusion slightly eccentric to the right. As before, the disc extrusion likely contacts the bilateral descending S1 nerve roots (series 6, image 38). Central canal patent. Mild relative bilateral neural foraminal narrowing.   IMPRESSION: Lumbar spondylosis as outlined and having not significantly changed from the lumbar spine MRI of 01/19/2020. Findings  are most notably as follows.   At L5-S1, there is moderate disc degeneration. Disc bulge with endplate spurring. Superimposed broad-based central disc protrusion slightly eccentric to the right. As before, the disc extrusion likely contacts the bilateral descending S1 nerve roots. Correlate for bilateral S1 radiculopathy. Mild relative bilateral  neural foraminal narrowing.   No significant spinal canal or foraminal stenosis at the remaining levels.     Electronically Signed   By: Jackey Loge DO   On: 08/15/2020 08:34     Objective:  VS:  HT:    WT:   BMI:     BP:122/88  HR:(!) 101bpm  TEMP: ( )  RESP:  Physical Exam Vitals and nursing note reviewed.  Constitutional:      General: She is not in acute distress.    Appearance: Normal appearance. She is not ill-appearing.  HENT:     Head: Normocephalic and atraumatic.     Right Ear: External ear normal.     Left Ear: External ear normal.  Eyes:     Extraocular Movements: Extraocular movements intact.  Cardiovascular:     Rate and Rhythm: Normal rate.     Pulses: Normal pulses.  Pulmonary:     Effort: Pulmonary effort is normal. No respiratory distress.  Abdominal:     General: There is no distension.     Palpations: Abdomen is soft.  Musculoskeletal:        General: Tenderness present.     Cervical back: Neck supple.     Right lower leg: No edema.     Left lower leg: No edema.     Comments: Patient has good distal strength with no pain over the greater trochanters.  No clonus or focal weakness.  Skin:    Findings: No erythema, lesion or rash.  Neurological:     General: No focal deficit present.     Mental Status: She is alert and oriented to person, place, and time.     Cranial Nerves: No cranial nerve deficit.     Sensory: No sensory deficit.     Motor: No weakness or abnormal muscle tone.     Coordination: Coordination normal.     Gait: Gait normal.  Psychiatric:        Mood and Affect: Mood normal.        Behavior: Behavior normal.     Imaging: No results found.

## 2020-11-02 NOTE — Procedures (Signed)
S1 Lumbosacral Transforaminal Epidural Steroid Injection - Sub-Pedicular Approach with Fluoroscopic Guidance   Patient: Kathy Nguyen      Date of Birth: 08/16/1965 MRN: [id number] PCP: Pcp, No      Visit Date: 10/31/2020   Universal Protocol:    Date/Time: 07/27/226:26 AM  Consent Given By: the patient  Position:  PRONE  Additional Comments: Vital signs were monitored before and after the procedure. Patient was prepped and draped in the usual sterile fashion. The correct patient, procedure, and site was verified.   Injection Procedure Details:  Procedure Site One Meds Administered:  Meds ordered this encounter  Medications   betamethasone acetate-betamethasone sodium phosphate (CELESTONE) injection 12 mg    Laterality: Bilateral  Location/Site:  S1 Foramen   Needle size: 22 ga.  Needle type: Spinal  Needle Placement: Transforaminal  Findings:   -Comments: Excellent flow of contrast along the nerve, nerve root and into the epidural space.  Epidurogram: Contrast epidurogram showed no nerve root cut off or restricted flow pattern.  Procedure Details: After squaring off the sacral end-plate to get a true AP view, the C-arm was positioned so that the best possible view of the S1 foramen was visualized. The soft tissues overlying this structure were infiltrated with 2-3 ml. of 1% Lidocaine without Epinephrine.    The spinal needle was inserted toward the target using a "trajectory" view along the fluoroscope beam.  Under AP and lateral visualization, the needle was advanced so it did not puncture dura. Biplanar projections were used to confirm position. Aspiration was confirmed to be negative for CSF and/or blood. A 1-2 ml. volume of Isovue-250 was injected and flow of contrast was noted at each level. Radiographs were obtained for documentation purposes.   After attaining the desired flow of contrast documented above, a 0.5 to 1.0 ml test dose of 0.25% Marcaine was  injected into each respective transforaminal space.  The patient was observed for 90 seconds post injection.  After no sensory deficits were reported, and normal lower extremity motor function was noted,   the above injectate was administered so that equal amounts of the injectate were placed at each foramen (level) into the transforaminal epidural space.   Additional Comments:  The patient tolerated the procedure well Dressing: Band-Aid with 2 x 2 sterile gauze    Post-procedure details: Patient was observed during the procedure. Post-procedure instructions were reviewed.  Patient left the clinic in stable condition.

## 2020-11-07 DIAGNOSIS — C441191 Basal cell carcinoma of skin of left upper eyelid, including canthus: Secondary | ICD-10-CM | POA: Diagnosis not present

## 2020-11-14 DIAGNOSIS — C441191 Basal cell carcinoma of skin of left upper eyelid, including canthus: Secondary | ICD-10-CM | POA: Diagnosis not present

## 2020-11-15 ENCOUNTER — Other Ambulatory Visit: Payer: Self-pay

## 2020-11-15 ENCOUNTER — Ambulatory Visit (INDEPENDENT_AMBULATORY_CARE_PROVIDER_SITE_OTHER): Payer: Medicaid Other | Admitting: Orthopaedic Surgery

## 2020-11-15 ENCOUNTER — Ambulatory Visit (INDEPENDENT_AMBULATORY_CARE_PROVIDER_SITE_OTHER): Payer: Medicaid Other

## 2020-11-15 ENCOUNTER — Encounter: Payer: Self-pay | Admitting: Orthopaedic Surgery

## 2020-11-15 DIAGNOSIS — M25552 Pain in left hip: Secondary | ICD-10-CM | POA: Diagnosis not present

## 2020-11-15 DIAGNOSIS — Z96641 Presence of right artificial hip joint: Secondary | ICD-10-CM

## 2020-11-15 NOTE — Progress Notes (Signed)
The patient comes in today for follow-up visit related to her right hip replacement and previous left hip pain.  She last had an intervention of the left hip many months ago under ultrasound with a steroid injection by Dr. Prince Rome.  She has had no issues with the hip.  She is taking no medication for pain.  She has had no recent falls or injuries.  She last saw Dr. Alvester Morin in May and had severe back pain and he did provide an intervention in her back then which helped significantly.  She wants to be able to see him again sooner if she starts developing a flareup of pain.  I have recommended that she call his section of the office to get set up an appointment in September or October to consider repeat lumbar spine injections if she starts to become symptomatic.  Her right operative hip moves smoothly and her left hip moves smoothly with no blocks to rotation and no pain.  An AP pelvis and lateral of the left hip shows no arthritis in the left hip that can be seen on plain films.  The joint space is well-maintained.  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  From my standpoint for hip follow-up can be as needed.  I have recommended that she get a follow-up appointment at some point for her back by Dr. Alvester Morin if he gets to the point where she is like to have repeat injections for the same pain that she has been having chronically.  All question concerns were answered and addressed.

## 2020-12-08 ENCOUNTER — Telehealth: Payer: Self-pay | Admitting: Physical Medicine and Rehabilitation

## 2020-12-08 NOTE — Telephone Encounter (Signed)
Scheduled for 9/7 at 0900.

## 2020-12-08 NOTE — Telephone Encounter (Signed)
Pt called stating she would like to get an appt for L hip inj.  [phone]

## 2020-12-08 NOTE — Telephone Encounter (Signed)
Patient had a left hip injection on 07/08/20 with Dr. Prince Rome. Ok to schedule?

## 2020-12-14 ENCOUNTER — Encounter: Payer: Self-pay | Admitting: Physical Medicine and Rehabilitation

## 2020-12-14 ENCOUNTER — Ambulatory Visit: Payer: Self-pay

## 2020-12-14 ENCOUNTER — Ambulatory Visit (INDEPENDENT_AMBULATORY_CARE_PROVIDER_SITE_OTHER): Payer: Medicaid Other | Admitting: Physical Medicine and Rehabilitation

## 2020-12-14 ENCOUNTER — Other Ambulatory Visit: Payer: Self-pay

## 2020-12-14 DIAGNOSIS — M25552 Pain in left hip: Secondary | ICD-10-CM

## 2020-12-14 MED ORDER — TRIAMCINOLONE ACETONIDE 40 MG/ML IJ SUSP
60.0000 mg | INTRAMUSCULAR | Status: AC | PRN
  Administered 2020-12-14: 60 mg via INTRA_ARTICULAR

## 2020-12-14 MED ORDER — BUPIVACAINE HCL 0.25 % IJ SOLN
4.0000 mL | INTRAMUSCULAR | Status: AC | PRN
  Administered 2020-12-14: 4 mL via INTRA_ARTICULAR

## 2020-12-14 NOTE — Progress Notes (Signed)
Kathy Nguyen - 55 y.o. female MRN [id number]  Date of birth: [birth year]  Office Visit Note: Visit Date: 12/14/2020 PCP: Pcp, No Referred by: No ref. provider found  Subjective: Chief Complaint  Patient presents with   Left Hip - Pain   HPI:  [name] is a 55 y.o. female who comes in today at the request of Dr. Doneen Poisson for planned Left anesthetic hip arthrogram with fluoroscopic guidance.  The patient has failed conservative care including home exercise, medications, time and activity modification.  This injection will be diagnostic and hopefully therapeutic.  Please see requesting physician notes for further details and justification.  Prior left hip injection by Dr. Lavada Mesi using ultrasound guidance was helpful.  This was in April.  She is status post total right hip arthroplasty   ROS Otherwise per HPI.  Assessment & Plan: Visit Diagnoses:    ICD-10-CM   1. Pain in left hip  M25.552 Large Joint Inj: L hip joint    XR C-ARM NO REPORT      Plan: No additional findings.   Meds & Orders: No orders of the defined types were placed in this encounter.   Orders Placed This Encounter  Procedures   Large Joint Inj: L hip joint   XR C-ARM NO REPORT    Follow-up: Return if symptoms worsen or fail to improve.   Procedures: Large Joint Inj: L hip joint on 12/14/2020 9:12 AM Indications: diagnostic evaluation and pain Details: 22 G 3.5 in needle, fluoroscopy-guided anterior approach  Arthrogram: No  Medications: 4 mL bupivacaine 0.25 %; 60 mg triamcinolone acetonide 40 MG/ML Outcome: tolerated well, no immediate complications  There was excellent flow of contrast producing a partial arthrogram of the hip. The patient did have relief of symptoms during the anesthetic phase of the injection. Procedure, treatment alternatives, risks and benefits explained, specific risks discussed. Consent was given by the patient. Immediately prior to procedure a time out was  called to verify the correct patient, procedure, equipment, support staff and site/side marked as required. Patient was prepped and draped in the usual sterile fashion.         Clinical History: MR OF THE LEFT HIP WITHOUT CONTRAST   TECHNIQUE: Multiplanar, multisequence MR imaging was performed. No intravenous contrast was administered.   COMPARISON:  Pelvis and right hip x-rays dated April 19, 2020. MRI right hip dated Aug 12, 2019.   FINDINGS: Bones: There is no evidence of acute fracture, dislocation or avascular necrosis. No focal bone lesion. Similar appearing mild degenerative changes of the pubic symphysis and sacroiliac joints. Prior right total hip arthroplasty with associated susceptibility artifact.   Articular cartilage and labrum   Articular cartilage: New areas of partial-thickness cartilage loss and fissuring over the anterior superior acetabulum with small subchondral cyst.   Labrum: Left anterior superior labral degeneration without discrete tear. No paralabral abnormality.   Joint or bursal effusion   Joint effusion: No significant hip joint effusion.   Bursae: No focal periarticular fluid collection.   Muscles and tendons   Muscles and tendons: Mild thickening and increased intermediate signal of the left gluteus minimus tendon with small amount of surrounding fluid. The left gluteus medius, right gluteal, bilateral iliopsoas, and bilateral hamstring tendons are intact. No muscle edema or atrophy.   Other findings   Miscellaneous: Prior hysterectomy. The visualized internal pelvic contents appear unremarkable.   IMPRESSION: 1. Mild left hip osteoarthritis. 2. Left gluteus minimus tendinosis.     Electronically  Signed   By: Obie Dredge M.D.   On: 07/01/2020 08:32 - MRI LUMBAR SPINE WITHOUT CONTRAST     COMPARISON:  Lumbar spine MRI 01/19/2020. lumbar spine radiographs 12/07/2019.    Alignment: Straightening of the expected  lumbar lordosis. No significant spondylolisthesis.   Vertebrae: Multilevel small Schmorl nodes. Vertebral body height is otherwise maintained. Trace degenerative edema within the posterior elements on the left at L5. Elsewhere, no significant marrow edema or focal suspicious osseous lesion is identified.     Paraspinal and other soft tissues: Small right renal cyst. Paraspinal soft tissues within normal limits.   Disc levels:   Unless otherwise stated, the level by level findings below have not significantly changed since prior MRI 01/19/2020.   Moderate disc degeneration at L5-S1. No more than mild disc degeneration at the remaining levels.   T12-L1: No significant disc herniation or stenosis. Perineural cyst within the right neural foramen.   L1-L2: Disc bulge with endplate spurring. Mild facet arthrosis. No significant spinal canal or foraminal stenosis.   L2-L3: Minimal facet arthrosis. No significant disc herniation or stenosis.   L3-L4: Disc bulge. Mild facet arthrosis/ligamentum flavum hypertrophy. No significant spinal canal or foraminal stenosis.   L4-L5: Disc bulge. Superimposed tiny left center disc protrusion (series 6, image 32). Mild facet arthrosis. No significant spinal canal or foraminal stenosis.   L5-S1: Disc bulge with endplate spurring. Superimposed broad-based central disc protrusion slightly eccentric to the right. As before, the disc extrusion likely contacts the bilateral descending S1 nerve roots (series 6, image 38). Central canal patent. Mild relative bilateral neural foraminal narrowing.   IMPRESSION: Lumbar spondylosis as outlined and having not significantly changed from the lumbar spine MRI of 01/19/2020. Findings are most notably as follows.   At L5-S1, there is moderate disc degeneration. Disc bulge with endplate spurring. Superimposed broad-based central disc protrusion slightly eccentric to the right. As before, the disc  extrusion likely contacts the bilateral descending S1 nerve roots. Correlate for bilateral S1 radiculopathy. Mild relative bilateral neural foraminal narrowing.   No significant spinal canal or foraminal stenosis at the remaining levels.     Electronically Signed By: Jackey Loge DO On: 08/15/2020 08:34     Objective:  VS:  HT:    WT:   BMI:     BP:   HR: bpm  TEMP: ( )  RESP:  Physical Exam   Imaging: XR C-ARM NO REPORT  Result Date: 12/14/2020 Please see Notes tab for imaging impression.

## 2020-12-14 NOTE — Progress Notes (Signed)
Pt state left hip pain. Pt state sitting, bending and standing makes the pain worse. Pt state she takes pain meds to help ease her pain.  Numeric Pain Rating Scale and Functional Assessment Average Pain 6   In the last MONTH (on 0-10 scale) has pain interfered with the following?  1. General activity like being  able to carry out your everyday physical activities such as walking, climbing stairs, carrying groceries, or moving a chair?  Rating(10)   -BT, -Dye Allergies.

## 2021-01-05 DIAGNOSIS — H5213 Myopia, bilateral: Secondary | ICD-10-CM | POA: Diagnosis not present

## 2021-02-07 DIAGNOSIS — G43909 Migraine, unspecified, not intractable, without status migrainosus: Secondary | ICD-10-CM | POA: Diagnosis not present

## 2021-02-07 DIAGNOSIS — R413 Other amnesia: Secondary | ICD-10-CM | POA: Diagnosis not present

## 2021-02-07 DIAGNOSIS — F418 Other specified anxiety disorders: Secondary | ICD-10-CM | POA: Diagnosis not present

## 2021-02-07 DIAGNOSIS — Z6827 Body mass index (BMI) 27.0-27.9, adult: Secondary | ICD-10-CM | POA: Diagnosis not present

## 2021-02-17 DIAGNOSIS — H524 Presbyopia: Secondary | ICD-10-CM | POA: Diagnosis not present

## 2021-02-17 DIAGNOSIS — H52223 Regular astigmatism, bilateral: Secondary | ICD-10-CM | POA: Diagnosis not present

## 2021-02-22 ENCOUNTER — Telehealth: Payer: Self-pay | Admitting: Physical Medicine and Rehabilitation

## 2021-02-22 DIAGNOSIS — M4807 Spinal stenosis, lumbosacral region: Secondary | ICD-10-CM

## 2021-02-22 NOTE — Telephone Encounter (Signed)
Referral placed to obtain auth. Patient aware we will call to schedule when authorized by insurance.

## 2021-02-22 NOTE — Telephone Encounter (Signed)
Patient called. She would like an appointment with Dr. Alvester Morin. Her call back number is [phone]

## 2021-02-22 NOTE — Telephone Encounter (Signed)
Bilateral S1 TF in July. Patient states pain is the same and injection helped. Ok to repeat?

## 2021-02-23 DIAGNOSIS — I10 Essential (primary) hypertension: Secondary | ICD-10-CM | POA: Diagnosis not present

## 2021-02-23 DIAGNOSIS — Z6827 Body mass index (BMI) 27.0-27.9, adult: Secondary | ICD-10-CM | POA: Diagnosis not present

## 2021-02-23 DIAGNOSIS — M5440 Lumbago with sciatica, unspecified side: Secondary | ICD-10-CM | POA: Diagnosis not present

## 2021-03-07 ENCOUNTER — Telehealth: Payer: Self-pay | Admitting: Physical Medicine and Rehabilitation

## 2021-03-07 DIAGNOSIS — C441191 Basal cell carcinoma of skin of left upper eyelid, including canthus: Secondary | ICD-10-CM | POA: Diagnosis not present

## 2021-03-07 DIAGNOSIS — L820 Inflamed seborrheic keratosis: Secondary | ICD-10-CM | POA: Diagnosis not present

## 2021-03-07 NOTE — Telephone Encounter (Signed)
Patient called. Returning a call to schedule with Dr. Newton.  ?

## 2021-03-21 ENCOUNTER — Encounter: Payer: Self-pay | Admitting: Physical Medicine and Rehabilitation

## 2021-03-21 ENCOUNTER — Ambulatory Visit (INDEPENDENT_AMBULATORY_CARE_PROVIDER_SITE_OTHER): Payer: Medicaid Other | Admitting: Physical Medicine and Rehabilitation

## 2021-03-21 ENCOUNTER — Other Ambulatory Visit: Payer: Self-pay

## 2021-03-21 ENCOUNTER — Ambulatory Visit: Payer: Self-pay

## 2021-03-21 VITALS — BP 138/78 | HR 82

## 2021-03-21 DIAGNOSIS — M5116 Intervertebral disc disorders with radiculopathy, lumbar region: Secondary | ICD-10-CM | POA: Diagnosis not present

## 2021-03-21 MED ORDER — METHYLPREDNISOLONE ACETATE 80 MG/ML IJ SUSP
80.0000 mg | Freq: Once | INTRAMUSCULAR | Status: AC
  Administered 2021-03-21: 11:00:00 80 mg

## 2021-03-21 NOTE — Patient Instructions (Signed)

## 2021-03-21 NOTE — Progress Notes (Signed)
Pt state lower back pain that travels to both legs pain. Pt state sitting, bending and standing makes the pain worse. Pt state she takes pain meds to help ease her pain.  Numeric Pain Rating Scale and Functional Assessment Average Pain 8   In the last MONTH (on 0-10 scale) has pain interfered with the following?  1. General activity like being  able to carry out your everyday physical activities such as walking, climbing stairs, carrying groceries, or moving a chair?  Rating(10)   +Driver, -BT, -Dye Allergies.

## 2021-03-22 ENCOUNTER — Telehealth: Payer: Self-pay

## 2021-03-22 MED ORDER — METHOCARBAMOL 500 MG PO TABS: 500.0000 mg | ORAL_TABLET | Freq: Four times a day (QID) | ORAL | 1 refills | Status: DC | PRN

## 2021-03-22 NOTE — Addendum Note (Signed)
Addended by: Barbette Or on: 03/22/2021 01:06 PM   Modules accepted: Orders

## 2021-03-22 NOTE — Telephone Encounter (Signed)
Refill request for methocarbamol 500mg  sent from the [facility] drug pharmacy. Please advise if ok

## 2021-03-22 NOTE — Telephone Encounter (Signed)
Sent to pharmacy 

## 2021-03-24 DIAGNOSIS — G43909 Migraine, unspecified, not intractable, without status migrainosus: Secondary | ICD-10-CM | POA: Diagnosis not present

## 2021-03-24 DIAGNOSIS — I10 Essential (primary) hypertension: Secondary | ICD-10-CM | POA: Diagnosis not present

## 2021-03-24 DIAGNOSIS — Z6827 Body mass index (BMI) 27.0-27.9, adult: Secondary | ICD-10-CM | POA: Diagnosis not present

## 2021-03-24 DIAGNOSIS — F418 Other specified anxiety disorders: Secondary | ICD-10-CM | POA: Diagnosis not present

## 2021-03-28 NOTE — Procedures (Signed)
S1 Lumbosacral Transforaminal Epidural Steroid Injection - Sub-Pedicular Approach with Fluoroscopic Guidance   Patient: Kathy Nguyen      Date of Birth: 02-17-1966 MRN: [id number] PCP: Pcp, No      Visit Date: 03/21/2021   Universal Protocol:    Date/Time: 12/20/226:34 AM  Consent Given By: the patient  Position:  PRONE  Additional Comments: Vital signs were monitored before and after the procedure. Patient was prepped and draped in the usual sterile fashion. The correct patient, procedure, and site was verified.   Injection Procedure Details:  Procedure Site One Meds Administered:  Meds ordered this encounter  Medications   methylPREDNISolone acetate (DEPO-MEDROL) injection 80 mg    Laterality: Bilateral  Location/Site:  S1 Foramen   Needle size: 22 ga.  Needle type: Spinal  Needle Placement: Transforaminal  Findings:   -Comments: Excellent flow of contrast along the nerve, nerve root and into the epidural space.  Epidurogram: Contrast epidurogram showed no nerve root cut off or restricted flow pattern.  Procedure Details: After squaring off the sacral end-plate to get a true AP view, the C-arm was positioned so that the best possible view of the S1 foramen was visualized. The soft tissues overlying this structure were infiltrated with 2-3 ml. of 1% Lidocaine without Epinephrine.    The spinal needle was inserted toward the target using a "trajectory" view along the fluoroscope beam.  Under AP and lateral visualization, the needle was advanced so it did not puncture dura. Biplanar projections were used to confirm position. Aspiration was confirmed to be negative for CSF and/or blood. A 1-2 ml. volume of Isovue-250 was injected and flow of contrast was noted at each level. Radiographs were obtained for documentation purposes.   After attaining the desired flow of contrast documented above, a 0.5 to 1.0 ml test dose of 0.25% Marcaine was injected into each respective  transforaminal space.  The patient was observed for 90 seconds post injection.  After no sensory deficits were reported, and normal lower extremity motor function was noted,   the above injectate was administered so that equal amounts of the injectate were placed at each foramen (level) into the transforaminal epidural space.   Additional Comments:  The patient tolerated the procedure well Dressing: Band-Aid with 2 x 2 sterile gauze    Post-procedure details: Patient was observed during the procedure. Post-procedure instructions were reviewed.  Patient left the clinic in stable condition.

## 2021-03-28 NOTE — Progress Notes (Signed)
Kathy Nguyen - 55 y.o. female MRN [id number]  Date of birth: [birth year]  Office Visit Note: Visit Date: 03/21/2021 PCP: Pcp, No Referred by: Tyrell Antonio, MD  Subjective: Chief Complaint  Patient presents with   Lower Back - Pain   Left Hip - Pain   Left Leg - Pain   Right Leg - Pain   HPI:  Kathy Nguyen is a 55 y.o. female who comes in today for planned repeat Bilateral S1-2  Lumbar Transforaminal epidural steroid injection with fluoroscopic guidance.  The patient has failed conservative care including home exercise, medications, time and activity modification.  This injection will be diagnostic and hopefully therapeutic.  Please see requesting physician notes for further details and justification. Patient received more than 50% pain relief from prior injection.  Prior injection was is in July.  We also seen the patient for her left hip injection which was performed recently.  She will follow-up with Dr. Magnus Ivan.  Referring: Dr. Doneen Poisson, Dr. Donalee Citrin   ROS Otherwise per HPI.  Assessment & Plan: Visit Diagnoses:    ICD-10-CM   1. Radiculopathy due to lumbar intervertebral disc disorder  M51.16 XR C-ARM NO REPORT    methylPREDNISolone acetate (DEPO-MEDROL) injection 80 mg    Epidural Steroid injection      Plan: No additional findings.   Meds & Orders:  Meds ordered this encounter  Medications   methylPREDNISolone acetate (DEPO-MEDROL) injection 80 mg    Orders Placed This Encounter  Procedures   XR C-ARM NO REPORT   Epidural Steroid injection    Follow-up: Return if symptoms worsen or fail to improve.   Procedures: No procedures performed  S1 Lumbosacral Transforaminal Epidural Steroid Injection - Sub-Pedicular Approach with Fluoroscopic Guidance   Patient: Kathy Nguyen      Date of Birth: 1965/11/11 MRN: [id number] PCP: Pcp, No      Visit Date: 03/21/2021   Universal Protocol:    Date/Time: 12/20/226:34 AM  Consent Given By: the  patient  Position:  PRONE  Additional Comments: Vital signs were monitored before and after the procedure. Patient was prepped and draped in the usual sterile fashion. The correct patient, procedure, and site was verified.   Injection Procedure Details:  Procedure Site One Meds Administered:  Meds ordered this encounter  Medications   methylPREDNISolone acetate (DEPO-MEDROL) injection 80 mg    Laterality: Bilateral  Location/Site:  S1 Foramen   Needle size: 22 ga.  Needle type: Spinal  Needle Placement: Transforaminal  Findings:   -Comments: Excellent flow of contrast along the nerve, nerve root and into the epidural space.  Epidurogram: Contrast epidurogram showed no nerve root cut off or restricted flow pattern.  Procedure Details: After squaring off the sacral end-plate to get a true AP view, the C-arm was positioned so that the best possible view of the S1 foramen was visualized. The soft tissues overlying this structure were infiltrated with 2-3 ml. of 1% Lidocaine without Epinephrine.    The spinal needle was inserted toward the target using a "trajectory" view along the fluoroscope beam.  Under AP and lateral visualization, the needle was advanced so it did not puncture dura. Biplanar projections were used to confirm position. Aspiration was confirmed to be negative for CSF and/or blood. A 1-2 ml. volume of Isovue-250 was injected and flow of contrast was noted at each level. Radiographs were obtained for documentation purposes.   After attaining the desired flow of contrast documented above, a 0.5 to 1.0  ml test dose of 0.25% Marcaine was injected into each respective transforaminal space.  The patient was observed for 90 seconds post injection.  After no sensory deficits were reported, and normal lower extremity motor function was noted,   the above injectate was administered so that equal amounts of the injectate were placed at each foramen (level) into the  transforaminal epidural space.   Additional Comments:  The patient tolerated the procedure well Dressing: Band-Aid with 2 x 2 sterile gauze    Post-procedure details: Patient was observed during the procedure. Post-procedure instructions were reviewed.  Patient left the clinic in stable condition.    Clinical History: MR OF THE LEFT HIP WITHOUT CONTRAST   TECHNIQUE: Multiplanar, multisequence MR imaging was performed. No intravenous contrast was administered.   COMPARISON:  Pelvis and right hip x-rays dated April 19, 2020. MRI right hip dated Aug 12, 2019.   FINDINGS: Bones: There is no evidence of acute fracture, dislocation or avascular necrosis. No focal bone lesion. Similar appearing mild degenerative changes of the pubic symphysis and sacroiliac joints. Prior right total hip arthroplasty with associated susceptibility artifact.   Articular cartilage and labrum   Articular cartilage: New areas of partial-thickness cartilage loss and fissuring over the anterior superior acetabulum with small subchondral cyst.   Labrum: Left anterior superior labral degeneration without discrete tear. No paralabral abnormality.   Joint or bursal effusion   Joint effusion: No significant hip joint effusion.   Bursae: No focal periarticular fluid collection.   Muscles and tendons   Muscles and tendons: Mild thickening and increased intermediate signal of the left gluteus minimus tendon with small amount of surrounding fluid. The left gluteus medius, right gluteal, bilateral iliopsoas, and bilateral hamstring tendons are intact. No muscle edema or atrophy.   Other findings   Miscellaneous: Prior hysterectomy. The visualized internal pelvic contents appear unremarkable.   IMPRESSION: 1. Mild left hip osteoarthritis. 2. Left gluteus minimus tendinosis.     Electronically Signed   By: Obie Dredge M.D.   On: 07/01/2020 08:32 - MRI LUMBAR SPINE WITHOUT CONTRAST      COMPARISON:  Lumbar spine MRI 01/19/2020. lumbar spine radiographs 12/07/2019.    Alignment: Straightening of the expected lumbar lordosis. No significant spondylolisthesis.   Vertebrae: Multilevel small Schmorl nodes. Vertebral body height is otherwise maintained. Trace degenerative edema within the posterior elements on the left at L5. Elsewhere, no significant marrow edema or focal suspicious osseous lesion is identified.     Paraspinal and other soft tissues: Small right renal cyst. Paraspinal soft tissues within normal limits.   Disc levels:   Unless otherwise stated, the level by level findings below have not significantly changed since prior MRI 01/19/2020.   Moderate disc degeneration at L5-S1. No more than mild disc degeneration at the remaining levels.   T12-L1: No significant disc herniation or stenosis. Perineural cyst within the right neural foramen.   L1-L2: Disc bulge with endplate spurring. Mild facet arthrosis. No significant spinal canal or foraminal stenosis.   L2-L3: Minimal facet arthrosis. No significant disc herniation or stenosis.   L3-L4: Disc bulge. Mild facet arthrosis/ligamentum flavum hypertrophy. No significant spinal canal or foraminal stenosis.   L4-L5: Disc bulge. Superimposed tiny left center disc protrusion (series 6, image 32). Mild facet arthrosis. No significant spinal canal or foraminal stenosis.   L5-S1: Disc bulge with endplate spurring. Superimposed broad-based central disc protrusion slightly eccentric to the right. As before, the disc extrusion likely contacts the bilateral descending S1 nerve roots (series  6, image 38). Central canal patent. Mild relative bilateral neural foraminal narrowing.   IMPRESSION: Lumbar spondylosis as outlined and having not significantly changed from the lumbar spine MRI of 01/19/2020. Findings are most notably as follows.   At L5-S1, there is moderate disc degeneration. Disc bulge  with endplate spurring. Superimposed broad-based central disc protrusion slightly eccentric to the right. As before, the disc extrusion likely contacts the bilateral descending S1 nerve roots. Correlate for bilateral S1 radiculopathy. Mild relative bilateral neural foraminal narrowing.   No significant spinal canal or foraminal stenosis at the remaining levels.     Electronically Signed By: Jackey Loge DO On: 08/15/2020 08:34     Objective:  VS:  HT:     WT:    BMI:      BP:138/78   HR:82bpm   TEMP: ( )   RESP:  Physical Exam Vitals and nursing note reviewed.  Constitutional:      General: She is not in acute distress.    Appearance: Normal appearance. She is not ill-appearing.  HENT:     Head: Normocephalic and atraumatic.     Right Ear: External ear normal.     Left Ear: External ear normal.  Eyes:     Extraocular Movements: Extraocular movements intact.  Cardiovascular:     Rate and Rhythm: Normal rate.     Pulses: Normal pulses.  Pulmonary:     Effort: Pulmonary effort is normal. No respiratory distress.  Abdominal:     General: There is no distension.     Palpations: Abdomen is soft.  Musculoskeletal:        General: Tenderness present.     Cervical back: Neck supple.     Right lower leg: No edema.     Left lower leg: No edema.     Comments: Patient has good distal strength with no pain over the greater trochanters.  No clonus or focal weakness.  Skin:    Findings: No erythema, lesion or rash.  Neurological:     General: No focal deficit present.     Mental Status: She is alert and oriented to person, place, and time.     Sensory: No sensory deficit.     Motor: No weakness or abnormal muscle tone.     Coordination: Coordination normal.  Psychiatric:        Mood and Affect: Mood normal.        Behavior: Behavior normal.     Imaging: No results found.

## 2021-04-13 ENCOUNTER — Telehealth: Payer: Self-pay | Admitting: Physical Medicine and Rehabilitation

## 2021-04-13 NOTE — Telephone Encounter (Signed)
Patient called. She would like an injection by Dr. Alvester Morin.

## 2021-04-27 ENCOUNTER — Encounter: Payer: Self-pay | Admitting: Physical Medicine and Rehabilitation

## 2021-04-27 ENCOUNTER — Ambulatory Visit (INDEPENDENT_AMBULATORY_CARE_PROVIDER_SITE_OTHER): Payer: Medicaid Other | Admitting: Physical Medicine and Rehabilitation

## 2021-04-27 ENCOUNTER — Ambulatory Visit: Payer: Self-pay

## 2021-04-27 ENCOUNTER — Other Ambulatory Visit: Payer: Self-pay

## 2021-04-27 DIAGNOSIS — M25552 Pain in left hip: Secondary | ICD-10-CM | POA: Diagnosis not present

## 2021-04-27 MED ORDER — BUPIVACAINE HCL 0.25 % IJ SOLN
4.0000 mL | INTRAMUSCULAR | Status: AC | PRN
  Administered 2021-04-27: 4 mL via INTRA_ARTICULAR

## 2021-04-27 MED ORDER — TRIAMCINOLONE ACETONIDE 40 MG/ML IJ SUSP
60.0000 mg | INTRAMUSCULAR | Status: AC | PRN
  Administered 2021-04-27: 60 mg via INTRA_ARTICULAR

## 2021-04-27 NOTE — Progress Notes (Signed)
Pt state left hip pain. Pt state sitting and bending makes the pain worse. Pt state she uses heat, ice and over the counter pain meds to help ease her pain.  Numeric Pain Rating Scale and Functional Assessment Average Pain 7   In the last MONTH (on 0-10 scale) has pain interfered with the following?  1. General activity like being  able to carry out your everyday physical activities such as walking, climbing stairs, carrying groceries, or moving a chair?  Rating(10)    -BT, -Dye Allergies.

## 2021-04-27 NOTE — Progress Notes (Signed)
Kathy Nguyen - 56 y.o. female MRN [id number]  Date of birth: 10/07/1965  Office Visit Note: Visit Date: 04/27/2021 PCP: Pcp, No Referred by: Tyrell Antonio, MD  Subjective: Chief Complaint  Patient presents with   Left Hip - Pain   HPI:  Kathy Nguyen is a 56 y.o. female who comes in today for planned repeat Left anesthetic hip arthrogram with fluoroscopic guidance.  The patient has failed conservative care including home exercise, medications, time and activity modification. Prior injection gave more than 50% relief for several months. This injection will be diagnostic and hopefully therapeutic.  Please see requesting physician notes for further details and justification.  Referring: Dr. Doneen Poisson  ROS Otherwise per HPI.  Assessment & Plan: Visit Diagnoses:    ICD-10-CM   1. Pain in left hip  M25.552 XR C-ARM NO REPORT    Large Joint Inj: L hip joint      Plan: No additional findings.   Meds & Orders: No orders of the defined types were placed in this encounter.   Orders Placed This Encounter  Procedures   Large Joint Inj   Large Joint Inj: L hip joint   XR C-ARM NO REPORT    Follow-up: Return for visit to requesting provider as needed.   Procedures: Large Joint Inj: L hip joint on 04/27/2021 10:49 AM Indications: diagnostic evaluation and pain Details: 22 G 3.5 in needle, fluoroscopy-guided anterior approach  Arthrogram: No  Medications: 4 mL bupivacaine 0.25 %; 60 mg triamcinolone acetonide 40 MG/ML Outcome: tolerated well, no immediate complications  There was excellent flow of contrast producing a partial arthrogram of the hip. The patient did have relief of symptoms during the anesthetic phase of the injection. Procedure, treatment alternatives, risks and benefits explained, specific risks discussed. Consent was given by the patient. Immediately prior to procedure a time out was called to verify the correct patient, procedure, equipment, support  staff and site/side marked as required. Patient was prepped and draped in the usual sterile fashion.         Clinical History: MR OF THE LEFT HIP WITHOUT CONTRAST   TECHNIQUE: Multiplanar, multisequence MR imaging was performed. No intravenous contrast was administered.   COMPARISON:  Pelvis and right hip x-rays dated April 19, 2020. MRI right hip dated Aug 12, 2019.   FINDINGS: Bones: There is no evidence of acute fracture, dislocation or avascular necrosis. No focal bone lesion. Similar appearing mild degenerative changes of the pubic symphysis and sacroiliac joints. Prior right total hip arthroplasty with associated susceptibility artifact.   Articular cartilage and labrum   Articular cartilage: New areas of partial-thickness cartilage loss and fissuring over the anterior superior acetabulum with small subchondral cyst.   Labrum: Left anterior superior labral degeneration without discrete tear. No paralabral abnormality.   Joint or bursal effusion   Joint effusion: No significant hip joint effusion.   Bursae: No focal periarticular fluid collection.   Muscles and tendons   Muscles and tendons: Mild thickening and increased intermediate signal of the left gluteus minimus tendon with small amount of surrounding fluid. The left gluteus medius, right gluteal, bilateral iliopsoas, and bilateral hamstring tendons are intact. No muscle edema or atrophy.   Other findings   Miscellaneous: Prior hysterectomy. The visualized internal pelvic contents appear unremarkable.   IMPRESSION: 1. Mild left hip osteoarthritis. 2. Left gluteus minimus tendinosis.     Electronically Signed   By: Obie Dredge M.D.   On: 07/01/2020 08:32 - MRI LUMBAR SPINE  WITHOUT CONTRAST     COMPARISON:  Lumbar spine MRI 01/19/2020. lumbar spine radiographs 12/07/2019.    Alignment: Straightening of the expected lumbar lordosis. No significant spondylolisthesis.   Vertebrae:  Multilevel small Schmorl nodes. Vertebral body height is otherwise maintained. Trace degenerative edema within the posterior elements on the left at L5. Elsewhere, no significant marrow edema or focal suspicious osseous lesion is identified.     Paraspinal and other soft tissues: Small right renal cyst. Paraspinal soft tissues within normal limits.   Disc levels:   Unless otherwise stated, the level by level findings below have not significantly changed since prior MRI 01/19/2020.   Moderate disc degeneration at L5-S1. No more than mild disc degeneration at the remaining levels.   T12-L1: No significant disc herniation or stenosis. Perineural cyst within the right neural foramen.   L1-L2: Disc bulge with endplate spurring. Mild facet arthrosis. No significant spinal canal or foraminal stenosis.   L2-L3: Minimal facet arthrosis. No significant disc herniation or stenosis.   L3-L4: Disc bulge. Mild facet arthrosis/ligamentum flavum hypertrophy. No significant spinal canal or foraminal stenosis.   L4-L5: Disc bulge. Superimposed tiny left center disc protrusion (series 6, image 32). Mild facet arthrosis. No significant spinal canal or foraminal stenosis.   L5-S1: Disc bulge with endplate spurring. Superimposed broad-based central disc protrusion slightly eccentric to the right. As before, the disc extrusion likely contacts the bilateral descending S1 nerve roots (series 6, image 38). Central canal patent. Mild relative bilateral neural foraminal narrowing.   IMPRESSION: Lumbar spondylosis as outlined and having not significantly changed from the lumbar spine MRI of 01/19/2020. Findings are most notably as follows.   At L5-S1, there is moderate disc degeneration. Disc bulge with endplate spurring. Superimposed broad-based central disc protrusion slightly eccentric to the right. As before, the disc extrusion likely contacts the bilateral descending S1 nerve roots.  Correlate for bilateral S1 radiculopathy. Mild relative bilateral neural foraminal narrowing.   No significant spinal canal or foraminal stenosis at the remaining levels.     Electronically Signed By: Jackey Loge DO On: 08/15/2020 08:34     Objective:  VS:  HT:     WT:    BMI:      BP:    HR: bpm   TEMP: ( )   RESP:  Physical Exam   Imaging: No results found.

## 2021-05-25 ENCOUNTER — Emergency Department (HOSPITAL_COMMUNITY)
Admission: EM | Admit: 2021-05-25 | Discharge: 2021-05-26 | Disposition: A | Discharge status: Home / Self Care | Payer: Medicaid Other | Attending: Emergency Medicine | Admitting: Emergency Medicine

## 2021-05-25 ENCOUNTER — Other Ambulatory Visit: Payer: Self-pay

## 2021-05-25 DIAGNOSIS — I10 Essential (primary) hypertension: Secondary | ICD-10-CM | POA: Diagnosis not present

## 2021-05-25 DIAGNOSIS — R6884 Jaw pain: Secondary | ICD-10-CM | POA: Diagnosis not present

## 2021-05-25 DIAGNOSIS — H5712 Ocular pain, left eye: Secondary | ICD-10-CM | POA: Diagnosis present

## 2021-05-25 DIAGNOSIS — Z79899 Other long term (current) drug therapy: Secondary | ICD-10-CM | POA: Insufficient documentation

## 2021-05-25 DIAGNOSIS — Z96641 Presence of right artificial hip joint: Secondary | ICD-10-CM | POA: Insufficient documentation

## 2021-05-25 DIAGNOSIS — Y9289 Other specified places as the place of occurrence of the external cause: Secondary | ICD-10-CM | POA: Insufficient documentation

## 2021-05-25 DIAGNOSIS — R519 Headache, unspecified: Secondary | ICD-10-CM | POA: Insufficient documentation

## 2021-05-25 DIAGNOSIS — H5710 Ocular pain, unspecified eye: Secondary | ICD-10-CM | POA: Diagnosis not present

## 2021-05-25 DIAGNOSIS — S0590XA Unspecified injury of unspecified eye and orbit, initial encounter: Secondary | ICD-10-CM | POA: Diagnosis not present

## 2021-05-25 DIAGNOSIS — X58XXXA Exposure to other specified factors, initial encounter: Secondary | ICD-10-CM | POA: Diagnosis not present

## 2021-05-25 DIAGNOSIS — T2000XA Burn of unspecified degree of head, face, and neck, unspecified site, initial encounter: Secondary | ICD-10-CM | POA: Diagnosis not present

## 2021-05-25 DIAGNOSIS — S0502XA Injury of conjunctiva and corneal abrasion without foreign body, left eye, initial encounter: Secondary | ICD-10-CM | POA: Insufficient documentation

## 2021-05-25 MED ORDER — ONDANSETRON 4 MG PO TBDP
4.0000 mg | ORAL_TABLET | Freq: Once | ORAL | Status: AC
  Administered 2021-05-25: 4 mg via ORAL
  Filled 2021-05-25: qty 1

## 2021-05-25 MED ORDER — TETRACAINE HCL 0.5 % OP SOLN
1.0000 [drp] | Freq: Once | OPHTHALMIC | Status: AC
  Administered 2021-05-26: 1 [drp] via OPHTHALMIC
  Filled 2021-05-25: qty 4

## 2021-05-25 MED ORDER — KETOROLAC TROMETHAMINE 15 MG/ML IJ SOLN
15.0000 mg | Freq: Once | INTRAMUSCULAR | Status: AC
  Filled 2021-05-25: qty 1

## 2021-05-25 NOTE — ED Provider Triage Note (Signed)
Emergency Medicine Provider Triage Evaluation Note  Kathy Nguyen , a 56 y.o. female  was evaluated in triage.  Pt complains of L eye irritation/pain which has been constant and worsening since a laser resurfacing treatment today by Dr. Lorina Rabon in [place], [place]. Aggravated by eye opening. Patient has taken Oxycodone, Robaxin, Gabapentin for pain w/o relief. Reports pain is causing associated nausea and photosensitivity.  Review of Systems  Positive: As above Negative: As above  Physical Exam  BP (!) 116/91 (BP Location: Right Arm)    Pulse 93    Temp 98.9 F (37.2 C) (Oral)    Resp (!) 22    Ht 5\' 5"  (1.651 m)    Wt 74.8 kg    SpO2 99%    BMI 27.46 kg/m  Gen:   Awake, no distress   Resp:  Normal effort  MSK:   Moves extremities without difficulty  Other:  L upper lid blepharitis. No obvious conjunctival injection of the L eye. L pupil round and reactive to light.  Medical Decision Making  Medically screening exam initiated at 11:28 PM.  Appropriate orders placed.  MILI CLAYTOR was informed that the remainder of the evaluation will be completed by another provider, this initial triage assessment does not replace that evaluation, and the importance of remaining in the ED until their evaluation is complete.  Eye pain - medications ordered for symptom control pending formal evaluation.   Antonietta Breach, PA-C 05/25/21 2331

## 2021-05-25 NOTE — ED Triage Notes (Addendum)
Procedure (skin resurfacing including eye lids) at 3:30pm today, about after returning home began having L eye, L jaw, and L HA that has become progressively worse. MD told her that the facial redness is normal, concerned that she is experiencing corneal abrasion from this. EKG unremarkable with EMS  Endorses nausea, dry heaves in triage

## 2021-05-26 MED ORDER — ACETAMINOPHEN 500 MG PO TABS
1000.0000 mg | ORAL_TABLET | Freq: Once | ORAL | Status: AC
  Administered 2021-05-26: 1000 mg via ORAL
  Filled 2021-05-26: qty 2

## 2021-05-26 MED ORDER — OFLOXACIN 0.3 % OP SOLN
1.0000 [drp] | Freq: Four times a day (QID) | OPHTHALMIC | Status: DC
  Administered 2021-05-26: 1 [drp] via OPHTHALMIC
  Filled 2021-05-26: qty 5

## 2021-05-26 MED ORDER — KETOROLAC TROMETHAMINE 15 MG/ML IJ SOLN
INTRAMUSCULAR | Status: AC
  Administered 2021-05-26: 15 mg via INTRAMUSCULAR
  Filled 2021-05-26: qty 1

## 2021-05-26 MED ORDER — FLUORESCEIN SODIUM 1 MG OP STRP
1.0000 | ORAL_STRIP | Freq: Once | OPHTHALMIC | Status: AC
  Administered 2021-05-26: 1 via OPHTHALMIC
  Filled 2021-05-26: qty 1

## 2021-05-26 NOTE — ED Notes (Signed)
Tetracaine eye drops at bedside.

## 2021-05-26 NOTE — ED Provider Notes (Signed)
[facility] EMERGENCY DEPARTMENT Provider Note  CSN: [id number] Arrival date & time: 05/25/21 2233  Chief Complaint(s) Eye Pain  HPI Kathy Nguyen is a 56 y.o. female    Eye Pain Patient had  laser skin resurfacing (including eye lids) at 3:30pm today. about after returning home she began having L eye, L jaw, and L HA that has become progressively worse. On call MD told her that the facial redness is normal, concerned that she is experiencing corneal abrasion from this.   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 Authorizing Provider  ALPRAZolam (XANAX) 0.25 MG tablet Take 0.25 mg by mouth 2 (two) times daily as needed for anxiety. 07/06/19  Yes [provider]  aspirin-sod bicarb-citric acid (ALKA-SELTZER) 325 MG TBEF tablet Take 325 mg by mouth every 6 (six) hours as needed (nausea).   Yes [provider]  buPROPion (WELLBUTRIN XL) 300 MG 24 hr tablet Take 300 mg by mouth daily. 08/14/19  Yes [provider]  Cyanocobalamin (VITAMIN B-12 PO) Take 1 tablet by mouth daily.   Yes [provider]  dicyclomine (BENTYL) 10 MG capsule Take 10 mg by mouth 3 (three) times daily as needed for spasms.   Yes [provider]  estradiol (ESTRACE) 1 MG tablet Take 1 mg by mouth daily.   Yes [provider]  gabapentin (NEURONTIN) 100 MG capsule Take 2 capsules  (200 mg total) by mouth 3 (three) times daily. Patient taking differently: Take 100 mg by mouth 3 (three) times daily as needed (pain). 12/07/19  Yes Kirtland Bouchard, PA-C  l-methylfolate-B6-B12 (METANX) 3-35-2 MG TABS tablet Take 1 tablet by mouth daily.   Yes [provider]  Lactobacillus (FLORAJEN WOMEN PO) Take 1 tablet by mouth daily.   Yes [provider]  loratadine (CLARITIN) 10 MG tablet Take 10 mg by mouth daily as needed for allergies.   Yes [provider]  methocarbamol (ROBAXIN) 500 MG tablet Take 1 tablet (500 mg total) by mouth every 6 (six) hours as needed for muscle spasms. 03/22/21  Yes Kirtland Bouchard, PA-C  olmesartan-hydrochlorothiazide (BENICAR HCT) 20-12.5 MG tablet Take 1 tablet by mouth every morning. 08/11/19  Yes [provider]  ondansetron (ZOFRAN ODT) 4 MG disintegrating tablet Take 1 tablet (4 mg total) by mouth every 8 (eight) hours as needed for nausea or vomiting. 09/11/19  Yes [name], MD  polyethylene glycol (MIRALAX / GLYCOLAX) 17 g packet Take 17 g by mouth daily as needed for moderate constipation.   Yes [provider]  valACYclovir (VALTREX) 500 MG tablet Take 500 mg by mouth daily as needed (outbreak).   Yes [provider]  Allergies Biaxin [clarithromycin] and Penicillins  Review of Systems Review of Systems  Eyes:  Positive for pain.  As noted in HPI  Physical Exam Vital Signs  I have reviewed the triage vital signs BP (!) 156/107 (BP Location: Right Arm)    Pulse 92    Temp 98.4 F (36.9 C) (Oral)    Resp 20    Ht 5\' 5"  (1.651 m)    Wt 74.8 kg    SpO2 99%    BMI 27.46 kg/m   Physical Exam Vitals reviewed.  Constitutional:      General: She is not in acute distress.    Appearance: She is well-developed. She is not diaphoretic.  HENT:     Head:  Normocephalic and atraumatic.     Comments: Burn to face consistent with laser therapy    Right Ear: External ear normal.     Left Ear: External ear normal.     Nose: Nose normal.  Eyes:     General: Vision grossly intact. No visual field deficit or scleral icterus.       Right eye: No discharge.        Left eye: No discharge.     Conjunctiva/sclera:     Right eye: Right conjunctiva is injected. No chemosis, exudate or hemorrhage.    Left eye: Left conjunctiva is injected. No chemosis, exudate or hemorrhage.    Pupils: Pupils are equal, round, and reactive to light. Pupils are equal.     Right eye: No corneal abrasion or fluorescein uptake.     Left eye: Corneal abrasion (linear; not in dotted pattern typical of laser exposure) and fluorescein uptake present. Seidel exam negative.  Neck:     Trachea: Phonation normal.  Cardiovascular:     Rate and Rhythm: Normal rate and regular rhythm.  Pulmonary:     Effort: Pulmonary effort is normal. No respiratory distress.     Breath sounds: No stridor.  Abdominal:     General: There is no distension.  Musculoskeletal:        General: Normal range of motion.     Cervical back: Normal range of motion.  Neurological:     Mental Status: She is alert and oriented to person, place, and time.  Psychiatric:        Behavior: Behavior normal.    ED Results and Treatments Labs (all labs ordered are listed, but only abnormal results are displayed) Labs Reviewed - No data to display                                                                                                                       EKG  EKG Interpretation  Date/Time:    Ventricular Rate:    PR Interval:    QRS Duration:   QT Interval:    QTC Calculation:   R Axis:     Text Interpretation:         Radiology No results found.  Pertinent labs & imaging results  that were available during my care of the patient were reviewed by me and considered in my medical decision  making (see MDM for details).  Medications Ordered in ED Medications  ofloxacin (OCUFLOX) 0.3 % ophthalmic solution 1 drop (1 drop Left Eye Given 05/26/21 0729)  tetracaine (PONTOCAINE) 0.5 % ophthalmic solution 1 drop (1 drop Left Eye Given 05/26/21 0622)  ketorolac (TORADOL) 15 MG/ML injection 15 mg (15 mg Intramuscular Given 05/26/21 0628)  ondansetron (ZOFRAN-ODT) disintegrating tablet 4 mg (4 mg Oral Given 05/25/21 2342)  fluorescein ophthalmic strip 1 strip (1 strip Left Eye Given 05/26/21 [id number])  acetaminophen (TYLENOL) tablet 1,000 mg (1,000 mg Oral Given 05/26/21 0729)                                                                                                                                     Procedures Procedures  (including critical care time)  Medical Decision Making / ED Course        Eye pain Consistent with corneal abrasion, likely from metal eye covers. She does not have evidence that would be concerning for preseptal or orbital cellulitis requiring labs or CT scan at this time  Management: IM Toradol for pain Tetracaine drops for pain Oral Tylenol for pain Ofloxacin drops for prophylaxis  Reassessment: Pain improved        Assessment/Plan:                                                                                                                                              Corneal abrasion Ofloxacin drops for prophylaxis Diluted tetracaine drops for additional pain control Instructed to call her ophthalmologist for close follow-up.  Final Clinical Impression(s) / ED Diagnoses Final diagnoses:  Abrasion of left cornea, initial encounter   The patient appears reasonably screened and/or stabilized for discharge and I doubt any other medical condition or other [facility] requiring further screening, evaluation, or treatment in the ED at this time prior to discharge. Safe for discharge with strict return precautions.  Disposition: Discharge  Condition:  Good  I have discussed the results, Dx and Tx plan with the patient/family who expressed understanding and agree(s) with the plan. Discharge instructions discussed at length. The patient/family was given strict return precautions who verbalized understanding of the instructions. No further  questions at time of discharge.    ED Discharge Orders     None        Follow Up: [place], [facility] [address] [place] [address] [phone]  Call today to schedule an appointment for close follow up  Lise Auer, MD [address] [place] [address] [phone]  Call  as needed           This chart was dictated using voice recognition software.  Despite best efforts to proofread,  errors can occur which can change the documentation meaning.    Nira Conn, MD 05/26/21 [phone]

## 2021-05-26 NOTE — Discharge Instructions (Signed)
ofloxacin (OCUFLOX) 0.3 % ophthalmic solution 1 drop 1 drop, Left Eye, 4 times daily, First dose on Fri 05/26/21 at 0715, For 7 days,  Tetracaine 0.05% Ophthalmic 1 - 2 drops in affected eye q60 min as needed for pain Disp: 2-3 mL **Not to exceed 24 - 48 hours of use

## 2021-05-29 DIAGNOSIS — H5713 Ocular pain, bilateral: Secondary | ICD-10-CM | POA: Diagnosis not present

## 2021-06-01 DIAGNOSIS — H5310 Unspecified subjective visual disturbances: Secondary | ICD-10-CM | POA: Diagnosis not present

## 2021-06-01 DIAGNOSIS — R519 Headache, unspecified: Secondary | ICD-10-CM | POA: Diagnosis not present

## 2021-06-01 DIAGNOSIS — H5713 Ocular pain, bilateral: Secondary | ICD-10-CM | POA: Diagnosis not present

## 2021-06-15 DIAGNOSIS — G43909 Migraine, unspecified, not intractable, without status migrainosus: Secondary | ICD-10-CM | POA: Diagnosis not present

## 2021-06-15 DIAGNOSIS — Z6826 Body mass index (BMI) 26.0-26.9, adult: Secondary | ICD-10-CM | POA: Diagnosis not present

## 2021-06-20 ENCOUNTER — Other Ambulatory Visit: Payer: Self-pay

## 2021-06-20 ENCOUNTER — Encounter (HOSPITAL_BASED_OUTPATIENT_CLINIC_OR_DEPARTMENT_OTHER): Payer: Self-pay

## 2021-06-20 ENCOUNTER — Emergency Department (HOSPITAL_BASED_OUTPATIENT_CLINIC_OR_DEPARTMENT_OTHER)
Admission: EM | Admit: 2021-06-20 | Discharge: 2021-06-20 | Disposition: A | Discharge status: Home / Self Care | Payer: Medicaid Other | Attending: Emergency Medicine | Admitting: Emergency Medicine

## 2021-06-20 ENCOUNTER — Ambulatory Visit
Admission: EM | Admit: 2021-06-20 | Discharge: 2021-06-20 | Disposition: A | Discharge status: Home / Self Care | Payer: Medicaid Other | Attending: Physician Assistant | Admitting: Physician Assistant

## 2021-06-20 DIAGNOSIS — J069 Acute upper respiratory infection, unspecified: Secondary | ICD-10-CM

## 2021-06-20 DIAGNOSIS — U071 COVID-19: Secondary | ICD-10-CM | POA: Diagnosis not present

## 2021-06-20 DIAGNOSIS — Z7982 Long term (current) use of aspirin: Secondary | ICD-10-CM | POA: Insufficient documentation

## 2021-06-20 DIAGNOSIS — I10 Essential (primary) hypertension: Secondary | ICD-10-CM | POA: Diagnosis not present

## 2021-06-20 DIAGNOSIS — R110 Nausea: Secondary | ICD-10-CM | POA: Diagnosis present

## 2021-06-20 DIAGNOSIS — M542 Cervicalgia: Secondary | ICD-10-CM | POA: Diagnosis not present

## 2021-06-20 LAB — CBC WITH DIFFERENTIAL/PLATELET
Abs Immature Granulocytes: 0.01 10*3/uL (ref 0.00–0.07)
Basophils Absolute: 0 10*3/uL (ref 0.0–0.1)
Basophils Relative: 0 %
Eosinophils Absolute: 0 10*3/uL (ref 0.0–0.5)
Eosinophils Relative: 0 %
HCT: 35.3 % — ABNORMAL LOW (ref 36.0–46.0)
Hemoglobin: 11.5 g/dL — ABNORMAL LOW (ref 12.0–15.0)
Immature Granulocytes: 0 %
Lymphocytes Relative: 14 %
Lymphs Abs: 0.9 10*3/uL (ref 0.7–4.0)
MCH: 29 pg (ref 26.0–34.0)
MCHC: 32.6 g/dL (ref 30.0–36.0)
MCV: 89.1 fL (ref 80.0–100.0)
Monocytes Absolute: 0.7 10*3/uL (ref 0.1–1.0)
Monocytes Relative: 11 %
Neutro Abs: 4.8 10*3/uL (ref 1.7–7.7)
Neutrophils Relative %: 75 %
Platelets: 191 10*3/uL (ref 150–400)
RBC: 3.96 MIL/uL (ref 3.87–5.11)
RDW: 12.8 % (ref 11.5–15.5)
WBC: 6.5 10*3/uL (ref 4.0–10.5)
nRBC: 0 % (ref 0.0–0.2)

## 2021-06-20 LAB — BASIC METABOLIC PANEL
Anion gap: 10 (ref 5–15)
BUN: 12 mg/dL (ref 6–20)
CO2: 24 mmol/L (ref 22–32)
Calcium: 8.9 mg/dL (ref 8.9–10.3)
Chloride: 103 mmol/L (ref 98–111)
Creatinine, Ser: 0.94 mg/dL (ref 0.44–1.00)
GFR, Estimated: >60 mL/min (ref >60)
Glucose, Bld: 102 mg/dL — ABNORMAL HIGH (ref 70–99)
Potassium: 4 mmol/L (ref 3.5–5.1)
Sodium: 137 mmol/L (ref 135–145)

## 2021-06-20 LAB — RESP PANEL BY RT-PCR (FLU A&B, COVID) ARPGX2
Influenza A by PCR: NEGATIVE
Influenza B by PCR: NEGATIVE
SARS Coronavirus 2 by RT PCR: POSITIVE — AB

## 2021-06-20 LAB — POCT INFLUENZA A/B
Influenza A, POC: NEGATIVE
Influenza B, POC: NEGATIVE

## 2021-06-20 LAB — GROUP A STREP BY PCR: Group A Strep by PCR: NOT DETECTED

## 2021-06-20 MED ORDER — IBUPROFEN 800 MG PO TABS
800.0000 mg | ORAL_TABLET | Freq: Once | ORAL | Status: AC
  Administered 2021-06-20: 800 mg via ORAL

## 2021-06-20 MED ORDER — NIRMATRELVIR/RITONAVIR (PAXLOVID)TABLET: 3.0000 | ORAL_TABLET | Freq: Two times a day (BID) | ORAL | 0 refills | Status: AC

## 2021-06-20 MED ORDER — ACETAMINOPHEN 325 MG PO TABS
650.0000 mg | ORAL_TABLET | Freq: Once | ORAL | Status: AC
  Administered 2021-06-20: 650 mg via ORAL
  Filled 2021-06-20: qty 2

## 2021-06-20 MED ORDER — NIRMATRELVIR/RITONAVIR (PAXLOVID)TABLET: 3.0000 | ORAL_TABLET | Freq: Two times a day (BID) | ORAL | 0 refills | Status: DC

## 2021-06-20 MED ORDER — ONDANSETRON HCL 4 MG/2ML IJ SOLN
4.0000 mg | Freq: Once | INTRAMUSCULAR | Status: AC
  Administered 2021-06-20: 4 mg via INTRAVENOUS
  Filled 2021-06-20: qty 2

## 2021-06-20 MED ORDER — ONDANSETRON HCL 4 MG PO TABS: 4.0000 mg | ORAL_TABLET | Freq: Four times a day (QID) | ORAL | 0 refills | Status: DC

## 2021-06-20 MED ORDER — SODIUM CHLORIDE 0.9 % IV BOLUS
1000.0000 mL | Freq: Once | INTRAVENOUS | Status: AC
  Administered 2021-06-20: 1000 mL via INTRAVENOUS

## 2021-06-20 NOTE — Discharge Instructions (Addendum)
[facility] ? ?[address] ?[place], [place] ? ? ?[facility] [place] [facility] ? ?[address] ?[place], [place] ? ? ? ?

## 2021-06-20 NOTE — ED Triage Notes (Addendum)
Patient here POV from Home with URI Symptoms. ? ?Endorses Mild Cough, Congestion, Generalized Body Aches, Fevers since yesterday AM. ? ?States symptoms have worsened since they began, especially the Body Aches. Evaluated by UC and sent for Evaluation. Negative for Flu at [facility]. ? ?Uncomfortable during Triage. A&Ox4. GCS 15. Ambulatory.  ?

## 2021-06-20 NOTE — Discharge Instructions (Addendum)
You are seen in the emergency department today for body aches.  While you were here you were diagnosed with COVID-19.  I have prescribed you COVID-19 viral drugs as well as Zofran for you to use for nausea.  You may continue to take Tylenol and Motrin as needed for fever and body aches.  Please continue to drink lots of water.  Please isolate for at least the first 5 days of your symptoms and then continue to wear a mask until 10 days out from your diagnosis.  Please return for worsening shortness of breath. ?

## 2021-06-20 NOTE — ED Triage Notes (Signed)
Onset yesterday of runny nose, body aches, fever and congestion. Pt notes very minimal cough and sore throat. Has been taking tylenol w/temporary relief. No v/d. ?

## 2021-06-20 NOTE — ED Provider Notes (Signed)
?[name] ? ? ? ?CSN: [id number] ?Arrival date & time: 06/20/21  0825 ? ? ?  ? ?History   ?Chief Complaint ?Chief Complaint  ?Patient presents with  ? Generalized Body Aches  ? ? ?HPI ?Kathy Nguyen is a 56 y.o. female.  ? ?Patient here today for evaluation of runny nose, body aches, fever and congestion that started yesterday.  She states that her Tmax at home was 101 Fahrenheit.  She has had minimal cough and sore throat.  She has been taking Tylenol with minimal relief.  She denies any nausea or vomiting or diarrhea.  She reports her body aches seem to be her worst symptom. ? ?The history is provided by the patient.  ? ?Past Medical History:  ?Diagnosis Date  ? Anxiety   ? Arthritis   ? Depression   ? Family history of adverse reaction to anesthesia   ? Father had a hard time awaking  ? GERD (gastroesophageal reflux disease)   ? Headache   ? History of hiatal hernia   ? Hypertension   ? Pneumonia   ? PONV (postoperative nausea and vomiting)   ? ? ?Patient Active Problem List  ? Diagnosis Date Noted  ? 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 Unilateral primary osteoarthritis, right hip 08/17/2019  ? ? ?Past Surgical History:  ?Procedure Laterality Date  ? ABDOMINAL HYSTERECTOMY  2008  ? APPENDECTOMY    ? 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 BREAST SURGERY  2008  ? Reduction & Lift  ? BREAST SURGERY    ? Implants  ? TOTAL HIP ARTHROPLASTY Right 09/08/2019  ? Procedure: RIGHT TOTAL HIP ARTHROPLASTY ANTERIOR APPROACH;  Surgeon: [name], MD;  Location: [facility] OR;  Service: Orthopedics;  Laterality: Right;  ? VULVA / PERINEUM BIOPSY Right   ? ? ?OB History   ?No obstetric history on file. ?  ? ? ? ?Home Medications   ? ?Prior to Admission medications   ?Medication Sig Start Date End Date Taking? Authorizing Provider  ?ALPRAZolam (XANAX) 0.25 MG tablet Take 0.25 mg by mouth 2 (two) times daily as needed for anxiety. 07/06/19   [provider]   ?aspirin-sod bicarb-citric acid (ALKA-SELTZER) 325 MG TBEF tablet Take 325 mg by mouth every 6 (six) hours as needed (nausea).    [provider]  ?buPROPion (WELLBUTRIN XL) 300 MG 24 hr tablet Take 300 mg by mouth daily. 08/14/19   [provider]  ?Cyanocobalamin (VITAMIN B-12 PO) Take 1 tablet by mouth daily.    [provider]  ?dicyclomine (BENTYL) 10 MG capsule Take 10 mg by mouth 3 (three) times daily as needed for spasms.    [provider]  ?estradiol (ESTRACE) 1 MG tablet Take 1 mg by mouth daily.    [provider]  ?gabapentin (NEURONTIN) 100 MG capsule Take 2 capsules (200 mg total) by mouth 3 (three) times daily. ?Patient taking differently: Take 100 mg by mouth 3 (three) times daily as needed (pain). 12/07/19   Kirtland Bouchard, PA-C  ?l-methylfolate-B6-B12 (METANX) 3-35-2 MG TABS tablet Take 1 tablet by mouth daily.    [provider]  ?Lactobacillus [facility] WOMEN PO) Take 1 tablet by mouth daily.    [provider]  ?loratadine (CLARITIN) 10 MG tablet Take 10 mg by mouth daily as needed for allergies.    [provider]  ?methocarbamol (ROBAXIN) 500 MG tablet Take 1 tablet (500 mg  total) by mouth every 6 (six) hours as needed for muscle spasms. 03/22/21   Kirtland Bouchardlark, Gilbert W, PA-C  ?olmesartan-hydrochlorothiazide (BENICAR HCT) 20-12.5 MG tablet Take 1 tablet by mouth every morning. 08/11/19   [provider]  ?ondansetron (ZOFRAN ODT) 4 MG disintegrating tablet Take 1 tablet (4 mg total) by mouth every 8 (eight) hours as needed for nausea or vomiting. 09/11/19   Kathryne HitchBlackman, Christopher Y, MD  ?polyethylene glycol (MIRALAX / GLYCOLAX) 17 g packet Take 17 g by mouth daily as needed for moderate constipation.    [provider]  ?valACYclovir (VALTREX) 500 MG tablet Take 500 mg by mouth daily as needed (outbreak).    [provider]  ? ? ?Family History ?History reviewed. No pertinent family history. ? ?Social  History ?Social History  ? ?Tobacco Use  ? Smoking status: Never  ? Smokeless tobacco: Never  ?Vaping Use  ? Vaping Use: Never used  ?Substance Use Topics  ? Alcohol use: Not Currently  ? Drug use: Never  ? ? ? ?Allergies   ?Biaxin [clarithromycin] and Penicillins ? ? ?Review of Systems ?Review of Systems  ?Constitutional:  Positive for fever.  ?HENT:  Positive for congestion and sore throat. Negative for ear pain.   ?Eyes:  Negative for discharge and redness.  ?Respiratory:  Positive for cough. Negative for shortness of breath and wheezing.   ?Gastrointestinal:  Negative for abdominal pain, diarrhea, nausea and vomiting.  ?Musculoskeletal:  Positive for myalgias.  ?Neurological:  Positive for headaches.  ? ? ?Physical Exam ?Triage Vital Signs ?ED Triage Vitals  ?Enc Vitals Group  ?   BP   ?   Pulse   ?   Resp   ?   Temp   ?   Temp src   ?   SpO2   ?   Weight   ?   Height   ?   Head Circumference   ?   Peak Flow   ?   Pain Score   ?   Pain Loc   ?   Pain Edu?   ?   Excl. in GC?   ? ?No data found. ? ?Updated Vital Signs ?BP 117/73 (BP Location: Left Arm)   Pulse (!) 122   Temp 99 ?F (37.2 ?C) (Oral)   Resp 17   SpO2 96%  ?   ? ?Physical Exam ?Vitals and nursing note reviewed.  ?Constitutional:   ?   Comments: Appears to not feel well  ?HENT:  ?   Head: Normocephalic and atraumatic.  ?   Nose: Congestion present.  ?   Mouth/Throat:  ?   Mouth: Mucous membranes are moist.  ?   Pharynx: Posterior oropharyngeal erythema present. No oropharyngeal exudate.  ?Eyes:  ?   Conjunctiva/sclera: Conjunctivae normal.  ?Cardiovascular:  ?   Rate and Rhythm: Normal rate and regular rhythm.  ?   Heart sounds: Normal heart sounds. No murmur heard. ?Pulmonary:  ?   Effort: Pulmonary effort is normal. No respiratory distress.  ?   Breath sounds: Normal breath sounds. No wheezing, rhonchi or rales.  ?Skin: ?   General: Skin is warm and dry.  ?Neurological:  ?   Mental Status: She is alert.  ?Psychiatric:     ?   Mood and Affect:  Mood normal.     ?   Thought Content: Thought content normal.  ? ? ? ?UC Treatments / Results  ?Labs ?(all labs ordered are listed, but only abnormal results are  displayed) ?Labs Reviewed  ?COVID-19, FLU A+B NAA  ?POCT INFLUENZA A/B  ? ? ?EKG ? ? ?Radiology ?No results found. ? ?Procedures ?Procedures (including critical care time) ? ?Medications Ordered in UC ?Medications  ?ibuprofen (ADVIL) tablet 800 mg (800 mg Oral Given 06/20/21 0950)  ? ? ?Initial Impression / Assessment and Plan / UC Course  ?I have reviewed the triage vital signs and the nursing notes. ? ?Pertinent labs & imaging results that were available during my care of the patient were reviewed by me and considered in my medical decision making (see chart for details). ? ?  ?Suspect likely viral etiology of symptoms.  Flu test negative in office.  Will order COVID screening.  Patient questions possible meningitis given some back pain along with body aches and discussed that we were unable to check for this in office but recommended she report to the med center for further evaluation.  Patient expresses understanding.  Ibuprofen administered in office to hopefully help with body aches and symptoms. ? ?Final Clinical Impressions(s) / UC Diagnoses  ? ?Final diagnoses:  ?Acute upper respiratory infection  ? ? ? ?Discharge Instructions   ? ?  ?[facility] ? ?[address] ?[place], [place] ? ? ?[facility] [place] ? ?[address] ?[place], [place] ? ? ? ? ? ? ? ?ED Prescriptions   ?None ?  ? ?PDMP not reviewed this encounter. ?  ?Tomi Bamberger, PA-C ?06/20/21 1003 ? ?

## 2021-06-20 NOTE — ED Provider Notes (Signed)
?[facility] EM[facility] DEPT ?Provider Note ? ? ?CSN: [id number] ?Arrival date & time: 06/20/21  1041 ? ?  ? ?History ? ?Chief Complaint  ?Patient presents with  ? Generalized Body Aches  ? ? ?Kathy Nguyen is a [age] y.o. female.  With past medical history of hypertension, anxiety who presents to the emergency department with body aches. ? ?Patient states that symptoms began yesterday.  She states that she was having sinus congestion however she felt like her symptoms were getting worse "by the hour."  She states that she began having body aches, rhinorrhea and nausea.  She endorses intermittent cough that is nonproductive.  She had fever yesterday up to 101.  She is taking Tylenol which seems to be relieving fever but not her body aches. She is having back pain. Denies vomiting or diarrhea, chest pain.  She denies recent travel, rashes.  No sick contacts. ? ?She was sent here from urgent care.  It appears in the note that she was tested for flu which was negative.  She had COVID screening which is still pending.  In the provider note it appears that she asked about meningitis given that she is having back pain.  They state they cannot test for that here so advised her to come to the ER. ? ?HPI ? ?  ? ?Home Medications ?Prior to Admission medications   ?Medication Sig Start Date End Date Taking? Authorizing Provider  ?ALPRAZolam (XANAX) 0.25 MG tablet Take 0.25 mg by mouth 2 (two) times daily as needed for anxiety. 07/06/19   [provider]  ?aspirin-sod bicarb-citric acid (ALKA-SELTZER) 325 MG TBEF tablet Take 325 mg by mouth every 6 (six) hours as needed (nausea).    [provider]  ?buPROPion (WELLBUTRIN XL) 300 MG 24 hr tablet Take 300 mg by mouth daily. 08/14/19   [provider]  ?Cyanocobalamin (VITAMIN B-12 PO) Take 1 tablet by mouth daily.    [provider]  ?dicyclomine (BENTYL) 10 MG capsule Take 10 mg by mouth 3 (three) times daily as needed for spasms.     [provider]  ?estradiol (ESTRACE) 1 MG tablet Take 1 mg by mouth daily.    [provider]  ?gabapentin (NEURONTIN) 100 MG capsule Take 2 capsules (200 mg total) by mouth 3 (three) times daily. ?Patient taking differently: Take 100 mg by mouth 3 (three) times daily as needed (pain). 12/07/19   Kirtland Bouchardlark, Gilbert W, PA-C  ?l-methylfolate-B6-B12 (METANX) 3-35-2 MG TABS tablet Take 1 tablet by mouth daily.    [provider]  ?Lactobacillus [facility](FLORAJEN WOMEN PO) Take 1 tablet by mouth daily.    [provider]  ?loratadine (CLARITIN) 10 MG tablet Take 10 mg by mouth daily as needed for allergies.    [provider]  ?methocarbamol (ROBAXIN) 500 MG tablet Take 1 tablet (500 mg total) by mouth every 6 (six) hours as needed for muscle spasms. 03/22/21   Kirtland Bouchardlark, Gilbert W, PA-C  ?olmesartan-hydrochlorothiazide (BENICAR HCT) 20-12.5 MG tablet Take 1 tablet by mouth every morning. 08/11/19   [provider]  ?ondansetron (ZOFRAN ODT) 4 MG disintegrating tablet Take 1 tablet (4 mg total) by mouth every 8 (eight) hours as needed for nausea or vomiting. 09/11/19   Kathryne HitchBlackman, Christopher Y, MD  ?polyethylene glycol (MIRALAX / GLYCOLAX) 17 g packet Take 17 g by mouth daily as needed for moderate constipation.    [provider]  ?valACYclovir (VALTREX) 500 MG tablet Take 500 mg by mouth daily as  needed (outbreak).    [provider]  ?   ? ?Allergies    ?Biaxin [clarithromycin] and Penicillins   ? ?Review of Systems   ?Review of Systems  ?Constitutional:  Positive for appetite change, fatigue and fever.  ?HENT:  Positive for congestion, rhinorrhea and sore throat.   ?Respiratory:  Positive for cough. Negative for shortness of breath.   ?Cardiovascular:  Negative for chest pain.  ?Gastrointestinal:  Positive for nausea. Negative for vomiting.  ?Musculoskeletal:  Positive for back pain.  ? ?Physical Exam ?Updated Vital Signs ?BP 119/79 (BP Location: Right Arm)   Pulse  99   Temp 97.9 ?F (36.6 ?C) (Oral)   Resp 16   Ht 5\' 5"  (1.651 m)   Wt 74.8 kg   SpO2 99%   BMI 27.44 kg/m?  ?Physical Exam ?Vitals and nursing note reviewed.  ?Constitutional:   ?   General: She is not in acute distress. ?   Appearance: Normal appearance. She is well-developed. She is ill-appearing. She is not toxic-appearing.  ?HENT:  ?   Head: Normocephalic and atraumatic.  ?   Nose: Congestion present.  ?   Mouth/Throat:  ?   Mouth: Mucous membranes are moist.  ?   Pharynx: Uvula midline. Posterior oropharyngeal erythema present.  ?   Tonsils: No tonsillar exudate or tonsillar abscesses.  ?Eyes:  ?   General: No scleral icterus. ?   Extraocular Movements: Extraocular movements intact.  ?   Conjunctiva/sclera: Conjunctivae normal.  ?Neck:  ?   Meningeal: Brudzinski's sign and Kernig's sign absent.  ?   Comments: She has tenderness in her neck with passive forward flexion in her back, but no involuntary knee or hip raise.  ?Cardiovascular:  ?   Rate and Rhythm: Normal rate and regular rhythm.  ?   Pulses: Normal pulses.  ?   Heart sounds: No murmur heard. ?Pulmonary:  ?   Effort: Pulmonary effort is normal. No respiratory distress.  ?   Breath sounds: Normal breath sounds.  ?Abdominal:  ?   General: Bowel sounds are normal. There is no distension.  ?   Palpations: Abdomen is soft.  ?   Tenderness: There is no abdominal tenderness.  ?Musculoskeletal:     ?   General: Normal range of motion.  ?   Cervical back: No rigidity.  ?Skin: ?   General: Skin is warm and dry.  ?   Capillary Refill: Capillary refill takes less than 2 seconds.  ?   Findings: No rash.  ?Neurological:  ?   General: No focal deficit present.  ?   Mental Status: She is alert and oriented to person, place, and time. Mental status is at baseline.  ?Psychiatric:     ?   Mood and Affect: Mood normal.     ?   Behavior: Behavior normal. Behavior is cooperative.     ?   Thought Content: Thought content normal.     ?   Judgment: Judgment normal.   ? ? ?ED Results / Procedures / Treatments   ?Labs ?(all labs ordered are listed, but only abnormal results are displayed) ?Labs Reviewed  ?RESP PANEL BY RT-PCR (FLU A&B, COVID) ARPGX2 - Abnormal; Notable for the following components:  ?    Result Value  ? SARS Coronavirus 2 by RT PCR POSITIVE (*)   ? All other components within normal limits  ?BASIC METABOLIC PANEL - Abnormal; Notable for the following components:  ? Glucose, Bld 102 (*)   ?  All other components within normal limits  ?CBC WITH DIFFERENTIAL/PLATELET - Abnormal; Notable for the following components:  ? Hemoglobin 11.5 (*)   ? HCT 35.3 (*)   ? All other components within normal limits  ?GROUP A STREP BY PCR  ? ?EKG ?None ? ?Radiology ?No results found. ? ?Procedures ?Procedures  ? ?Medications Ordered in ED ?Medications  ?sodium chloride 0.9 % bolus 1,000 mL (has no administration in time range)  ?ondansetron (ZOFRAN) injection 4 mg (has no administration in time range)  ? ?ED Course/ Medical Decision Making/ A&P ?  ?                        ?Medical Decision Making ?Amount and/or Complexity of Data Reviewed ?Labs: ordered. ? ?Risk ?OTC drugs. ?Prescription drug management. ? ?This patient presents to the ED for concern of body aches, this involves an extensive number of treatment options, and is a complaint that carries with it a high risk of complications and morbidity.  The differential diagnosis includes viral symptoms, fever, ? ? ?Co morbidities that complicate the patient evaluation ?Hypertension ? ?Additional history obtained:  ?Additional history obtained from: None ?External records from outside source obtained and reviewed including: None ? ?Lab Results: ?I personally ordered, reviewed, and interpreted labs. ?Pertinent results include: ?BMP without electrolyte derangement, AKI, transaminitis ?CBC without leukocytosis ?Strep negative ?COVID-19 positive ? ?Medications  ?I ordered medication including fluids, Zofran for nausea ?Reevaluation of  the patient after medication shows that patient improved ? ?Tests Considered: ?Chest x-ray ? ?ED Course: ?56 year old female who presents emergency department with myalgias, cough, fever.  Symptoms consistent with v

## 2021-06-21 ENCOUNTER — Telehealth: Payer: Self-pay

## 2021-06-21 LAB — COVID-19, FLU A+B NAA
Influenza A, NAA: NOT DETECTED
Influenza B, NAA: NOT DETECTED
SARS-CoV-2, NAA: DETECTED — AB

## 2021-06-21 NOTE — Telephone Encounter (Signed)
Transition Care Management Unsuccessful Follow-up Telephone Call ? ?Date of discharge and from where:  06/20/2021 from [facility] ? ?Attempts:  1st Attempt ? ?Reason for unsuccessful TCM follow-up call:  Left voice message ? ? ? ?

## 2021-06-22 ENCOUNTER — Telehealth: Payer: Self-pay | Admitting: *Deleted

## 2021-06-22 NOTE — Telephone Encounter (Signed)
Transition Care Management Follow-up Telephone Call ?Date of discharge and from where: 06/20/2021 from [facility] ?How have you been since you were released from the hospital? Patient stated that she is on and off feeling better. Patient is taking her medication as rx'ed from the ED.  ?Any questions or concerns? No ? ?Items Reviewed: ?Did the pt receive and understand the discharge instructions provided? Yes  ?Medications obtained and verified? Yes  ?Other? No  ?Any new allergies since your discharge? No  ?Dietary orders reviewed? No ?Do you have support at home? Yes  ? ?Functional Questionnaire: (I = Independent and D = Dependent) ?ADLs: I ? ?Bathing/Dressing- I ? ?Meal Prep- I ? ?Eating- I ? ?Maintaining continence- I ? ?Transferring/Ambulation- I ? ?Managing Meds- I ? ? ?Follow up appointments reviewed: ? ?PCP Hospital f/u appt confirmed? No   ?Specialist Hospital f/u appt confirmed? No   ?Are transportation arrangements needed? No  ?If their condition worsens, is the pt aware to call PCP or go to the Emergency Dept.? Yes ?Was the patient provided with contact information for the PCP's office or ED? Yes ?Was to pt encouraged to call back with questions or concerns? Yes ? ?

## 2021-06-22 NOTE — Telephone Encounter (Signed)
Pt called regarding having missed a phone call from [facility].  RNCM reviewed chart and found correct department; will relay message to calling department. ?

## 2021-07-04 DIAGNOSIS — Z1231 Encounter for screening mammogram for malignant neoplasm of breast: Secondary | ICD-10-CM | POA: Diagnosis not present

## 2021-07-08 IMAGING — MR MR LUMBAR SPINE W/O CM
5 series · 43 of 48 positions shown · non-contrast
Comparison: Lumbar spine radiographs 12/07/2019 and 11/04/2014

CLINICAL DATA: Lumbosacral stenosis. Low back pain that extends to
the gluteal area and lower extremities. Pain has become more severe
since September 2019.

EXAM:
MRI LUMBAR SPINE WITHOUT CONTRAST
TECHNIQUE: Multiplanar, multisequence MR imaging of the lumbar spine was
performed. No intravenous contrast was administered.

[Series 3: T2 · sagittal · 4.0mm · 0.88mm/px · 6 of 15 slices shown (1 of 2)]
[im 1/15]
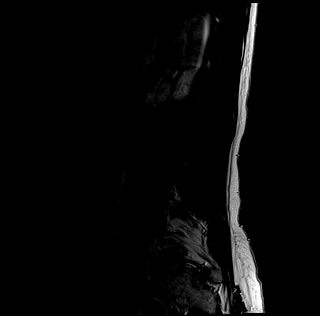
[im 3/15]
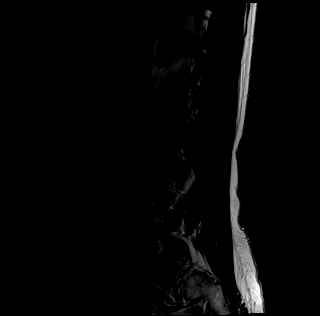
[im 6/15]
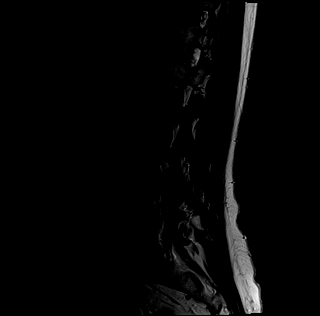
[im 9/15]
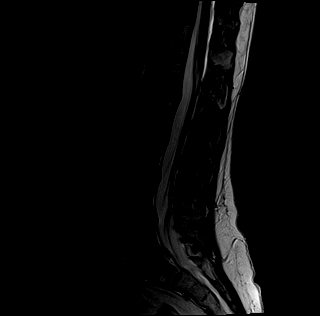
[im 12/15]
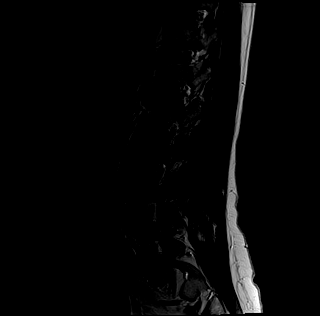
[im 15/15]
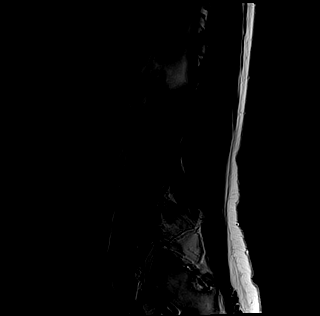

[Series 4: tirm sag · sagittal · 4.0mm · 0.55mm/px · 6 of 15 slices shown]
[im 1/15]
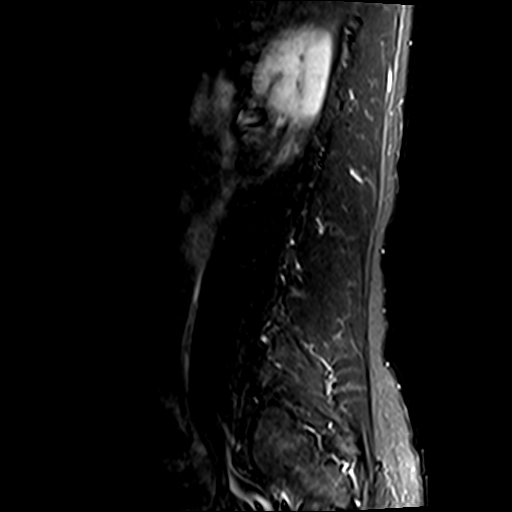
[im 3/15]
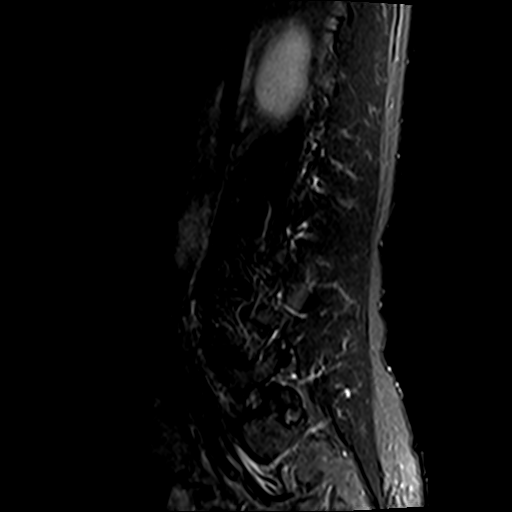
[im 6/15]
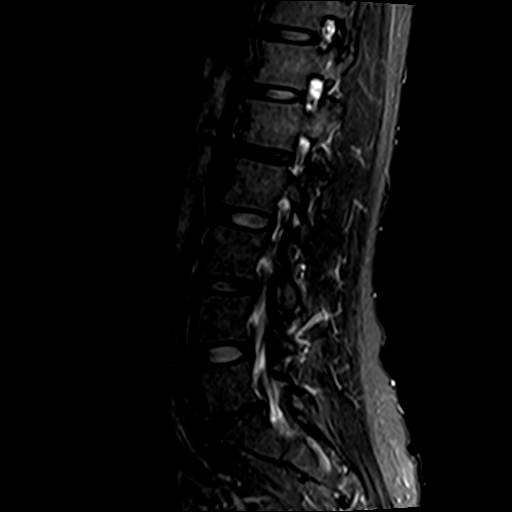
[im 9/15]
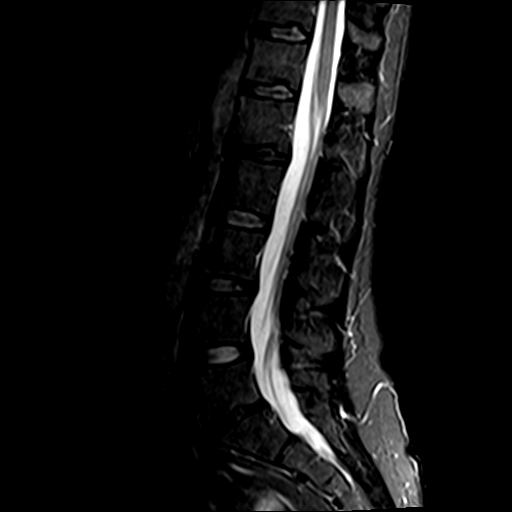
[im 12/15]
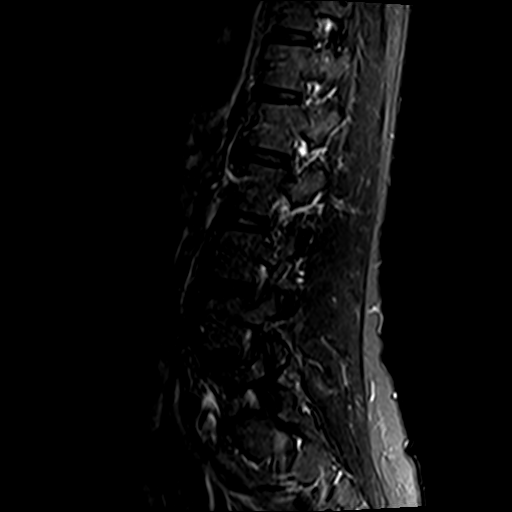
[im 15/15]
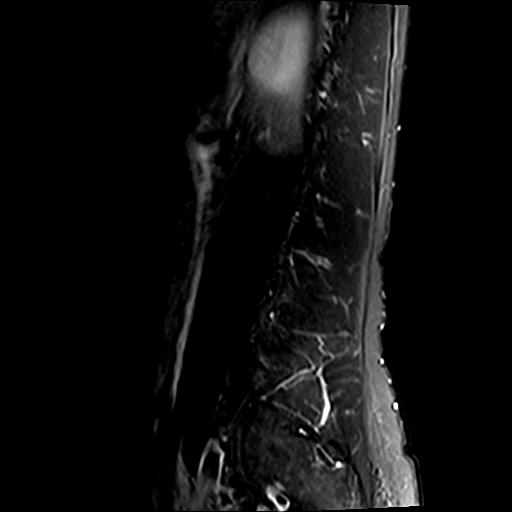

[Series 5: T1 · sagittal · 4.0mm · 0.88mm/px · 6 of 15 slices shown (1 of 2)]
[im 1/15]
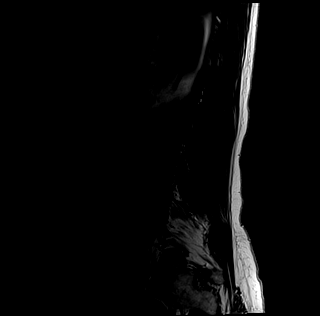
[im 3/15]
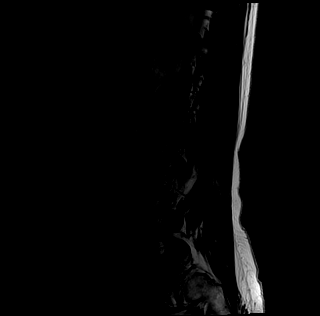
[im 6/15]
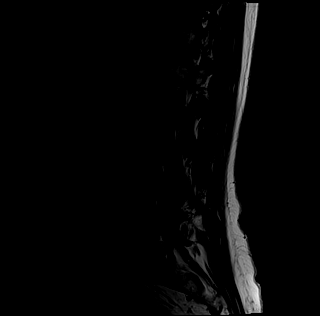
[im 9/15]
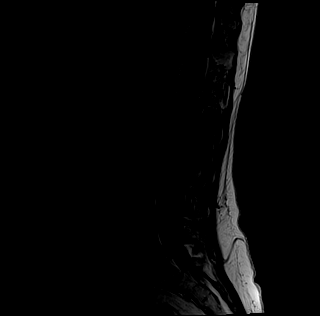
[im 12/15]
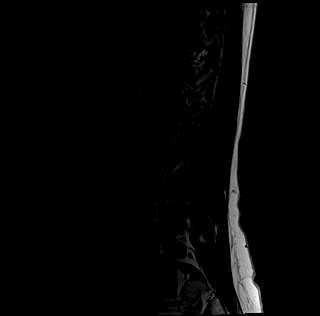
[im 15/15]
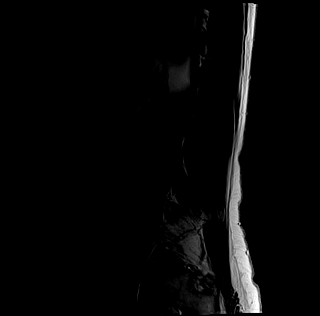

[Series 6: T1 · axial · 4.0mm · 0.70mm/px · z∈[-59,+167]mm · 10 of 41 slices shown (2 of 2)]
[im 1/41]
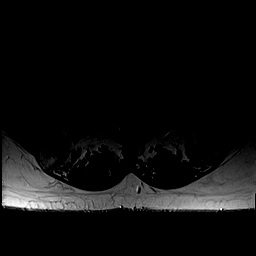
[im 3/41]
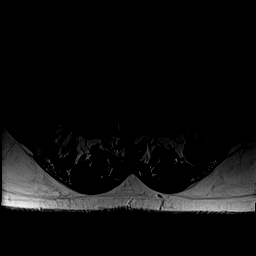
[im 6/41]
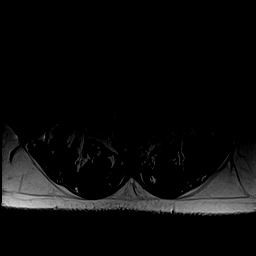
[im 12/41]
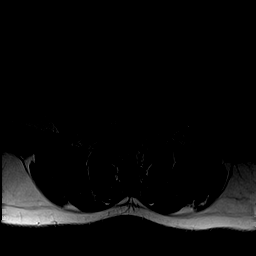
[im 18/41]
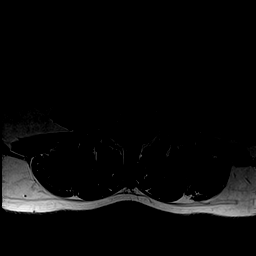
[im 21/41]
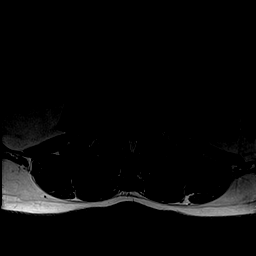
[im 23/41]
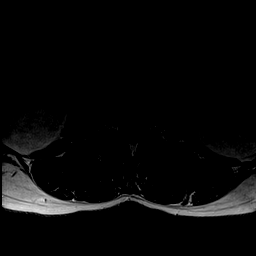
[im 29/41]
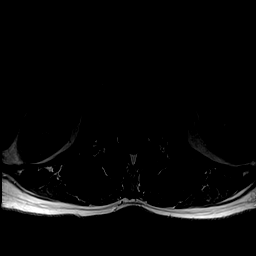
[im 35/41]
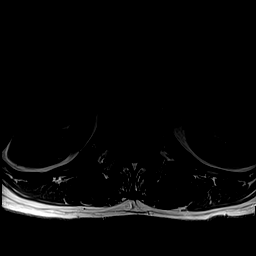
[im 41/41]
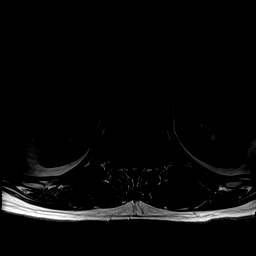

[Series 7: T2 · axial · 4.0mm · 0.70mm/px · z∈[-59,+167]mm · 15 of 41 slices shown (2 of 2)]
[im 1/41]
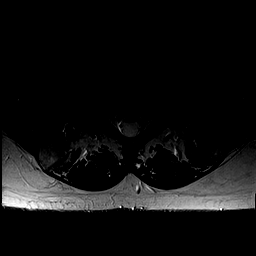
[im 3/41]
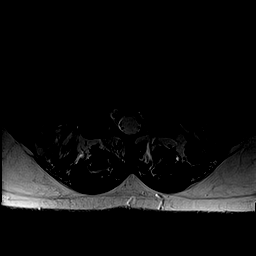
[im 6/41]
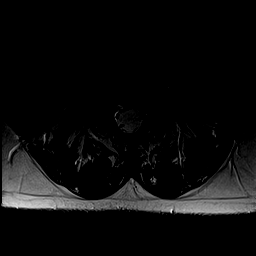
[im 9/41]
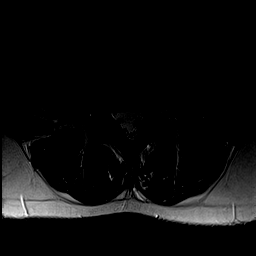
[im 12/41]
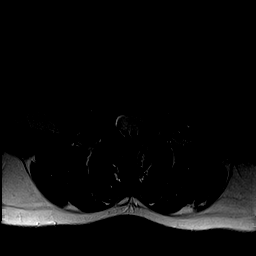
[im 15/41]
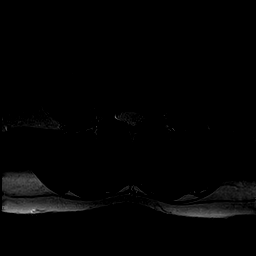
[im 18/41]
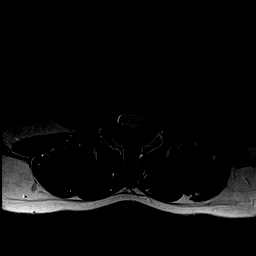
[im 21/41]
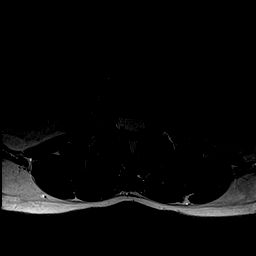
[im 23/41]
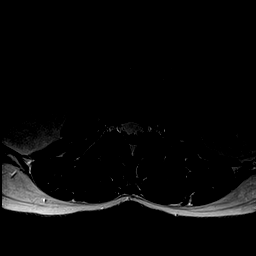
[im 26/41]
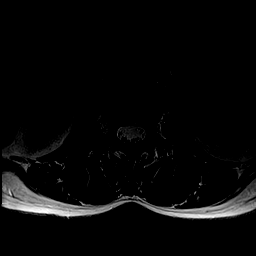
[im 29/41]
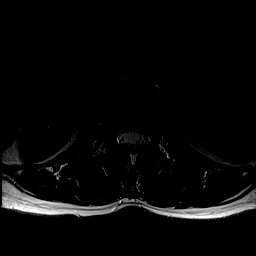
[im 32/41]
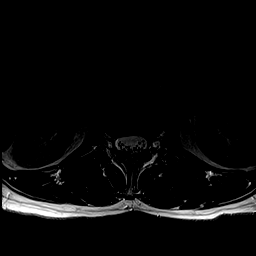
[im 35/41]
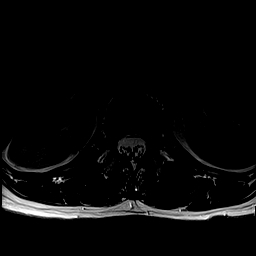
[im 38/41]
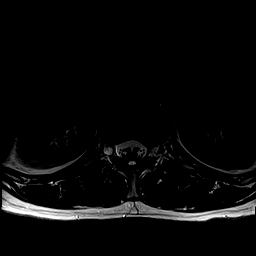
[im 41/41]
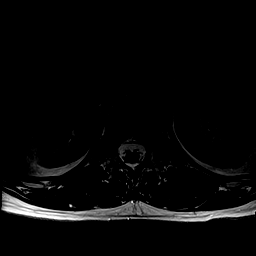

[43 of 48 positions shown; findings below may reference images not displayed]

FINDINGS: Segmentation: 5 non rib-bearing lumbar type vertebral bodies are
present. The lowest fully formed vertebral body is L5.

Alignment:  No significant listhesis is present.

Vertebrae: Schmorl's nodes are present along the inferior endplates
of T11 and T12 and at the L1-2 disc space. Marrow signal and
vertebral body heights are normal.

Conus medullaris and cauda equina: Conus extends to the L1 level.
Conus and cauda equina appear normal.

Paraspinal and other soft tissues: Limited imaging the abdomen is
unremarkable. There is no significant adenopathy. No solid organ
lesions are present.

Disc levels:

T12-L1: Negative.

L1-2: Mild disc desiccation is present. No focal protrusion or
stenosis is present.

L2-3: Negative.

L3-4: Lateral disc bulging extends into the foramina bilaterally.
Mild facet hypertrophy is present bilaterally. No significant
stenosis is present. L4-5: Lateral disc bulging is present. Far left
annular tear is present. Mild facet hypertrophy is noted
bilaterally. No focal stenosis is present.

L5-S1: A right paramedian disc extrusion extends 4 mm superior to
the inferior endplate of L5. This contacts and slightly displaces
the traversing right L5 nerve roots. Foramina are patent
bilaterally.
IMPRESSION: 1. Right paramedian disc extrusion at L5-S1 contacts and slightly
displaces the traversing right L5 nerve roots.
2. Lateral disc bulging and facet hypertrophy at L3-4 and L4-5
without significant stenosis.
3. Given the right lower extremity radiculopathy, the disc extrusion
at L5-S1 contacts and slightly displaces the traversing right S1
nerve roots.

## 2021-07-13 ENCOUNTER — Telehealth: Payer: Self-pay | Admitting: Physical Medicine and Rehabilitation

## 2021-07-13 DIAGNOSIS — M542 Cervicalgia: Secondary | ICD-10-CM | POA: Diagnosis not present

## 2021-07-13 DIAGNOSIS — M5481 Occipital neuralgia: Secondary | ICD-10-CM | POA: Diagnosis not present

## 2021-07-13 DIAGNOSIS — I10 Essential (primary) hypertension: Secondary | ICD-10-CM | POA: Diagnosis not present

## 2021-07-13 DIAGNOSIS — Z6826 Body mass index (BMI) 26.0-26.9, adult: Secondary | ICD-10-CM | POA: Diagnosis not present

## 2021-07-13 NOTE — Telephone Encounter (Signed)
Pt called requesting a call back to set a back injection. Please call pt at [phone]. ?

## 2021-07-17 ENCOUNTER — Telehealth: Payer: Self-pay | Admitting: Physical Medicine and Rehabilitation

## 2021-07-17 NOTE — Telephone Encounter (Signed)
Pt returned call to [facility] for an referral appt. Please call pt at [phone]. ?

## 2021-07-18 ENCOUNTER — Other Ambulatory Visit: Payer: Self-pay | Admitting: Neurosurgery

## 2021-07-18 DIAGNOSIS — M542 Cervicalgia: Secondary | ICD-10-CM

## 2021-07-22 ENCOUNTER — Ambulatory Visit
Admission: RE | Admit: 2021-07-22 | Discharge: 2021-07-22 | Disposition: A | Discharge status: Home / Self Care | Payer: Medicaid Other | Source: Ambulatory Visit | Attending: Neurosurgery | Admitting: Neurosurgery

## 2021-07-22 ENCOUNTER — Other Ambulatory Visit: Payer: Medicaid Other

## 2021-07-22 DIAGNOSIS — M542 Cervicalgia: Secondary | ICD-10-CM

## 2021-07-25 ENCOUNTER — Telehealth: Payer: Self-pay | Admitting: Physical Medicine and Rehabilitation

## 2021-07-25 NOTE — Telephone Encounter (Signed)
Pt called requesting a call to set a back injection. Please call pt at [phone] ?

## 2021-08-01 DIAGNOSIS — L821 Other seborrheic keratosis: Secondary | ICD-10-CM | POA: Diagnosis not present

## 2021-08-01 DIAGNOSIS — C441191 Basal cell carcinoma of skin of left upper eyelid, including canthus: Secondary | ICD-10-CM | POA: Diagnosis not present

## 2021-08-01 DIAGNOSIS — L578 Other skin changes due to chronic exposure to nonionizing radiation: Secondary | ICD-10-CM | POA: Diagnosis not present

## 2021-08-01 DIAGNOSIS — L820 Inflamed seborrheic keratosis: Secondary | ICD-10-CM | POA: Diagnosis not present

## 2021-08-22 ENCOUNTER — Ambulatory Visit (INDEPENDENT_AMBULATORY_CARE_PROVIDER_SITE_OTHER): Payer: Medicaid Other | Admitting: Physical Medicine and Rehabilitation

## 2021-08-22 ENCOUNTER — Ambulatory Visit: Payer: Self-pay

## 2021-08-22 VITALS — BP 131/93 | HR 94

## 2021-08-22 DIAGNOSIS — M5416 Radiculopathy, lumbar region: Secondary | ICD-10-CM

## 2021-08-22 MED ORDER — METHYLPREDNISOLONE ACETATE 80 MG/ML IJ SUSP
80.0000 mg | Freq: Once | INTRAMUSCULAR | Status: AC
  Administered 2021-08-22: 80 mg

## 2021-08-22 NOTE — Progress Notes (Signed)
"  Same pain as always, my injections work for about 5 months"

## 2021-08-22 NOTE — Patient Instructions (Signed)

## 2021-09-05 DIAGNOSIS — M542 Cervicalgia: Secondary | ICD-10-CM | POA: Diagnosis not present

## 2021-09-05 DIAGNOSIS — M5412 Radiculopathy, cervical region: Secondary | ICD-10-CM | POA: Diagnosis not present

## 2021-09-06 NOTE — Procedures (Signed)
S1 Lumbosacral Transforaminal Epidural Steroid Injection - Sub-Pedicular Approach with Fluoroscopic Guidance   Patient: Kathy Nguyen      Date of Birth: 1965-08-02 MRN: [id number] PCP: Lise Auer, MD      Visit Date: 08/22/2021   Universal Protocol:    Date/Time: 05/31/235:29 AM  Consent Given By: the patient  Position:  PRONE  Additional Comments: Vital signs were monitored before and after the procedure. Patient was prepped and draped in the usual sterile fashion. The correct patient, procedure, and site was verified.   Injection Procedure Details:  Procedure Site One Meds Administered:  Meds ordered this encounter  Medications   methylPREDNISolone acetate (DEPO-MEDROL) injection 80 mg    Laterality: Bilateral  Location/Site:  S1 Foramen   Needle size: 22 ga.  Needle type: Spinal  Needle Placement: Transforaminal  Findings:   -Comments: Excellent flow of contrast along the nerve, nerve root and into the epidural space.  Epidurogram: Contrast epidurogram showed no nerve root cut off or restricted flow pattern.  Procedure Details: After squaring off the sacral end-plate to get a true AP view, the C-arm was positioned so that the best possible view of the S1 foramen was visualized. The soft tissues overlying this structure were infiltrated with 2-3 ml. of 1% Lidocaine without Epinephrine.    The spinal needle was inserted toward the target using a "trajectory" view along the fluoroscope beam.  Under AP and lateral visualization, the needle was advanced so it did not puncture dura. Biplanar projections were used to confirm position. Aspiration was confirmed to be negative for CSF and/or blood. A 1-2 ml. volume of Isovue-250 was injected and flow of contrast was noted at each level. Radiographs were obtained for documentation purposes.   After attaining the desired flow of contrast documented above, a 0.5 to 1.0 ml test dose of 0.25% Marcaine was injected into each  respective transforaminal space.  The patient was observed for 90 seconds post injection.  After no sensory deficits were reported, and normal lower extremity motor function was noted,   the above injectate was administered so that equal amounts of the injectate were placed at each foramen (level) into the transforaminal epidural space.   Additional Comments:  The patient tolerated the procedure well Dressing: Band-Aid with 2 x 2 sterile gauze    Post-procedure details: Patient was observed during the procedure. Post-procedure instructions were reviewed.  Patient left the clinic in stable condition.

## 2021-09-06 NOTE — Progress Notes (Signed)
Kathy Nguyen - 56 y.o. female MRN [id number]  Date of birth: 1966-02-04  Office Visit Note: Visit Date: 08/22/2021 PCP: Mateo Flow, MD Referred by: Mateo Flow, MD  Subjective: Chief Complaint  Patient presents with   Lower Back - Pain   HPI:  Kathy Nguyen is a 56 y.o. female who comes in today for planned repeat Bilateral S1-2  Lumbar Transforaminal epidural steroid injection with fluoroscopic guidance.  The patient has failed conservative care including home exercise, medications, time and activity modification.  This injection will be diagnostic and hopefully therapeutic.  Please see requesting physician notes for further details and justification. Patient received more than 50% pain relief from prior injection.   Referring: Dr. Jean Rosenthal  ROS Otherwise per HPI.  Assessment & Plan: Visit Diagnoses:    ICD-10-CM   1. Lumbar radiculopathy  M54.16 XR C-ARM NO REPORT    Epidural Steroid injection    methylPREDNISolone acetate (DEPO-MEDROL) injection 80 mg      Plan: No additional findings.   Meds & Orders:  Meds ordered this encounter  Medications   methylPREDNISolone acetate (DEPO-MEDROL) injection 80 mg    Orders Placed This Encounter  Procedures   XR C-ARM NO REPORT   Epidural Steroid injection    Follow-up: Return if symptoms worsen or fail to improve.   Procedures: No procedures performed  S1 Lumbosacral Transforaminal Epidural Steroid Injection - Sub-Pedicular Approach with Fluoroscopic Guidance   Patient: Kathy Nguyen      Date of Birth: 10-Jun-1965 MRN: [id number] PCP: Mateo Flow, MD      Visit Date: 08/22/2021   Universal Protocol:    Date/Time: 05/31/235:29 AM  Consent Given By: the patient  Position:  PRONE  Additional Comments: Vital signs were monitored before and after the procedure. Patient was prepped and draped in the usual sterile fashion. The correct patient, procedure, and site was verified.   Injection Procedure  Details:  Procedure Site One Meds Administered:  Meds ordered this encounter  Medications   methylPREDNISolone acetate (DEPO-MEDROL) injection 80 mg    Laterality: Bilateral  Location/Site:  S1 Foramen   Needle size: 22 ga.  Needle type: Spinal  Needle Placement: Transforaminal  Findings:   -Comments: Excellent flow of contrast along the nerve, nerve root and into the epidural space.  Epidurogram: Contrast epidurogram showed no nerve root cut off or restricted flow pattern.  Procedure Details: After squaring off the sacral end-plate to get a true AP view, the C-arm was positioned so that the best possible view of the S1 foramen was visualized. The soft tissues overlying this structure were infiltrated with 2-3 ml. of 1% Lidocaine without Epinephrine.    The spinal needle was inserted toward the target using a "trajectory" view along the fluoroscope beam.  Under AP and lateral visualization, the needle was advanced so it did not puncture dura. Biplanar projections were used to confirm position. Aspiration was confirmed to be negative for CSF and/or blood. A 1-2 ml. volume of Isovue-250 was injected and flow of contrast was noted at each level. Radiographs were obtained for documentation purposes.   After attaining the desired flow of contrast documented above, a 0.5 to 1.0 ml test dose of 0.25% Marcaine was injected into each respective transforaminal space.  The patient was observed for 90 seconds post injection.  After no sensory deficits were reported, and normal lower extremity motor function was noted,   the above injectate was administered so that equal amounts of  the injectate were placed at each foramen (level) into the transforaminal epidural space.   Additional Comments:  The patient tolerated the procedure well Dressing: Band-Aid with 2 x 2 sterile gauze    Post-procedure details: Patient was observed during the procedure. Post-procedure instructions were  reviewed.  Patient left the clinic in stable condition.    Clinical History: No specialty comments available.     Objective:  VS:  HT:    WT:   BMI:     BP:(!) 131/93  HR:94bpm  TEMP: ( )  RESP:  Physical Exam Vitals and nursing note reviewed.  Constitutional:      General: She is not in acute distress.    Appearance: Normal appearance. She is not ill-appearing.  HENT:     Head: Normocephalic and atraumatic.     Right Ear: External ear normal.     Left Ear: External ear normal.  Eyes:     Extraocular Movements: Extraocular movements intact.  Cardiovascular:     Rate and Rhythm: Normal rate.     Pulses: Normal pulses.  Pulmonary:     Effort: Pulmonary effort is normal. No respiratory distress.  Abdominal:     General: There is no distension.     Palpations: Abdomen is soft.  Musculoskeletal:        General: Tenderness present.     Cervical back: Neck supple.     Right lower leg: No edema.     Left lower leg: No edema.     Comments: Patient has good distal strength with no pain over the greater trochanters.  No clonus or focal weakness.  Skin:    Findings: No erythema, lesion or rash.  Neurological:     General: No focal deficit present.     Mental Status: She is alert and oriented to person, place, and time.     Sensory: No sensory deficit.     Motor: No weakness or abnormal muscle tone.     Coordination: Coordination normal.  Psychiatric:        Mood and Affect: Mood normal.        Behavior: Behavior normal.     Imaging: No results found.

## 2021-09-07 DIAGNOSIS — M5412 Radiculopathy, cervical region: Secondary | ICD-10-CM | POA: Diagnosis not present

## 2021-09-20 NOTE — Therapy (Signed)
OUTPATIENT PHYSICAL THERAPY EVALUATION   Patient Name: Kathy Nguyen MRN: [id number] DOB:07/07/1965, 56 y.o., female Today's Date: 09/21/2021   PT End of Session - 09/21/21 1401     Visit Number 1    Number of Visits 8    Date for PT Re-Evaluation 11/16/21    Authorization Type [facility] [employer]    Authorization - Number of Visits 27    PT Start Time 1400    PT Stop Time 1445    PT Time Calculation (min) 45 min    Activity Tolerance Patient tolerated treatment well    Behavior During Therapy WFL for tasks assessed/performed             Past Medical History:  Diagnosis Date   Anxiety    Arthritis    Depression    Family history of adverse reaction to anesthesia    Father had a hard time awaking   GERD (gastroesophageal reflux disease)    Headache    History of hiatal hernia    Hypertension    Pneumonia    PONV (postoperative nausea and vomiting)    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 with f/u MRI 1 yr after   BREAST SURGERY  2008   Reduction & Lift   BREAST SURGERY     Implants   TOTAL HIP ARTHROPLASTY Right 09/08/2019   Procedure: RIGHT TOTAL HIP ARTHROPLASTY ANTERIOR APPROACH;  Surgeon: Kathryne HitchBlackman, Christopher Y, MD;  Location: [facility] OR;  Service: Orthopedics;  Laterality: Right;   VULVA / PERINEUM BIOPSY Right    Patient Active Problem List   Diagnosis Date Noted   Status post total replacement of right hip 09/08/2019   Unilateral primary osteoarthritis, right hip 08/17/2019    PCP: Lise AuerKhan, Jaber A, MD  REFERRING PROVIDER: Donalee Citrinram, Gary, MD  REFERRING DIAG: Cervicalgia  THERAPY DIAG:  Cervicalgia  Muscle weakness (generalized)  Rationale for Evaluation and Treatment Rehabilitation  ONSET DATE: ongoing for years   SUBJECTIVE:       SUBJECTIVE STATEMENT: Patient states she has been having neck pain and headaches for years but worsening since this past winter. She had  an epidural injection in her neck 2 weeks ago, which was her first injection in her neck. She has been having injections in her back over the past year or so. Patient states she has been having side effects, including headache, dizziness, and confusion, from the injection and states that she doesn't feel the injection helped. She reports her neck has been stiff and painful, but the headaches are the worst and are debilitating. She has a lot of difficulty sleeping because of the pain. She notes that she had an accident when she was young and she has had jobs where she was bending over a lot.   PERTINENT HISTORY:  Anxiety, chronic neck and back pain with headaches,   PAIN:  Are you having pain? Yes:  NPRS scale: 7/10 Pain location: Neck (left, base of skull), headaches (temporal) Pain description: Constant, aching, tightness, headaches throbbing Aggravating factors: Bending over, sleeping Relieving factors: Medication, heat / ice  PRECAUTIONS: None  WEIGHT BEARING RESTRICTIONS No  FALLS:  Has patient fallen in last 6 months? No  LIVING ENVIRONMENT: Lives with: lives with their family  OCCUPATION: Disability  PLOF: Independent  PATIENT GOALS: Pain relief and reduce headaches   OBJECTIVE:  DIAGNOSTIC FINDINGS:  MRI Cervical 07/22/2021: Cervical spine  degeneration with mild multilevel facet spurring and a notable C5-6 disc protrusion. The canal and foramina are diffusely patent.  PATIENT SURVEYS:  NDI 39/50 disability  COGNITION: Overall cognitive status: Within functional limits for tasks assessed  SENSATION: WFL  POSTURE: rounded shoulders and forward head   PALPATION: Tender left upper trap, cervical paraspinals, suboccipitals   CERVICAL ROM:   Active ROM A/PROM (deg) eval  Flexion 60  Extension 60  Right lateral flexion 40  Left lateral flexion 35  Right rotation 55  Left rotation 70   Patient notes pain will all movement  UPPER EXTREMITY ROM:   BUE ROM  grossly WFL  UPPER EXTREMITY MMT:  MMT Right eval Left eval  Shoulder flexion 4 4  Shoulder extension 4 4  Shoulder abduction 4 4  Shoulder internal rotation 4 4  Shoulder external rotation 4 4  Middle trapezius 3 3  Lower trapezius 3 3   CERVICAL SPECIAL TESTS:  DNF endurance 2 seconds   TODAY'S TREATMENT:  Therapeutic Exercise: Upper trap stretch x 20 sec Levator scap stretch x 20 sec Supine chin tuck with small towel roll x 5 Suboccipital SMFR using tennis balls Trigger Point Dry Needling Treatment: Pre-treatment instruction: Patient instructed on dry needling rationale, procedures, and possible side effects including pain during treatment (achy,cramping feeling), bruising, drop of blood, lightheadedness, nausea, sweating. Patient Consent Given: Yes Education handout provided: Yes Muscles treated: left upper trap, splenius cervicis, suboccipitals  Needle size and number: .30x71mm x 2 and .25x67mm x 2 Electrical stimulation performed: No Parameters: N/A Treatment response/outcome: Twitch response elicited and Palpable decrease in muscle tension Post-treatment instructions: Patient instructed to expect possible mild to moderate muscle soreness later today and/or tomorrow. Patient instructed in methods to reduce muscle soreness and to continue prescribed HEP. If patient was dry needled over the lung field, patient was instructed on signs and symptoms of pneumothorax and, however unlikely, to see immediate medical attention should they occur. Patient was also educated on signs and symptoms of infection and to seek medical attention should they occur. Patient verbalized understanding of these instructions and education.   PATIENT EDUCATION:  Education details: Exam findings, POC, HEP, TPDN Person educated: Patient Education method: Explanation, Demonstration, Tactile cues, Verbal cues, and Handouts Education comprehension: verbalized understanding, returned demonstration,  verbal cues required, tactile cues required, and needs further education  HOME EXERCISE PROGRAM: Access Code: [email or web address]   ASSESSMENT: CLINICAL IMPRESSION: Patient is a 56 y.o. female who was seen today for physical therapy evaluation and treatment for neck pain and headaches. It is unclear whether headaches are cervicogenic in nature but she does have limitations in cervical motion with tenderness and trigger points noted throughout cervical musculature. She has significant limitations in DNF endurance and periscapular strength likely contributing to neck pain and postural deficits.   OBJECTIVE IMPAIRMENTS decreased activity tolerance, decreased ROM, decreased strength, increased muscle spasms, postural dysfunction, and pain.   ACTIVITY LIMITATIONS bending, sitting, sleeping, and hygiene/grooming  PARTICIPATION LIMITATIONS: meal prep, cleaning, driving, shopping, community activity, and occupation  PERSONAL FACTORS Past/current experiences and Time since onset of injury/illness/exacerbation are also affecting patient's functional outcome.   REHAB POTENTIAL: Good  CLINICAL DECISION MAKING: Stable/uncomplicated  EVALUATION COMPLEXITY: Low   GOALS: Goals reviewed with patient? Yes  SHORT TERM GOALS: Target date: 10/19/2021   Patient will be I with initial HEP in order to progress with therapy. Baseline: HEP provided at eval Goal status: INITIAL  2.  Patient will report </= 5/10 neck pain and headache  in order to reduce functional limitation Baseline: 7/10 pain Goal status: INITIAL  3.  Patient will demonstrate DNF endurance >/= 10 seconds in order to improve sitting tolerance Baseline: DNF endurance 2 seconds Goal status: INITIAL  LONG TERM GOALS: Target date: 11/16/2021  Patient will be I with final HEP to maintain progress from PT. Baseline: HEP provided at eval Goal status: INITIAL  2.  Patient will report </= 25/50 disability on NDI in order to indicate improved  functional ability Baseline: 39/50 disability Goal status: INITIAL  3.  Patient will demonstrate >/= 20 sec DNF endurance and periscapular strength >/= 4/5 MMT in order to improve postural control and reduce neck pain Baseline: DNF endurance 2 sec, periscapular strength 3/5 MMT Goal status: INITIAL  4.  Patient will report </= 3/10 neck pain and headaches in order to improve ability to perform social and recreational tasks Baseline: 7/10 pain Goal status: INITIAL   PLAN: PT FREQUENCY: 1-2x/week  PT DURATION: 8 weeks  PLANNED INTERVENTIONS: Therapeutic exercises, Therapeutic activity, Neuromuscular re-education, Balance training, Gait training, Patient/Family education, Joint manipulation, Joint mobilization, Aquatic Therapy, Dry Needling, Electrical stimulation, Spinal manipulation, Spinal mobilization, Cryotherapy, Moist heat, Taping, Manual therapy, and Re-evaluation  PLAN FOR NEXT SESSION: Review and progress PRN, dry needling/manual for cervical region, gentle cervical stretching, DNF endurance and progressive postural strengthening   Rosana Hoes, PT, DPT, LAT, ATC 09/21/21  3:23 PM Phone: [phone] Fax: [phone]

## 2021-09-21 ENCOUNTER — Encounter: Payer: Self-pay | Admitting: Physical Therapy

## 2021-09-21 ENCOUNTER — Other Ambulatory Visit: Payer: Self-pay

## 2021-09-21 ENCOUNTER — Ambulatory Visit: Payer: Medicaid Other | Attending: Neurosurgery | Admitting: Physical Therapy

## 2021-09-21 DIAGNOSIS — M542 Cervicalgia: Secondary | ICD-10-CM | POA: Insufficient documentation

## 2021-09-21 DIAGNOSIS — M6281 Muscle weakness (generalized): Secondary | ICD-10-CM | POA: Insufficient documentation

## 2021-09-21 NOTE — Patient Instructions (Addendum)
Access Code: [email or web address] URL: [email or web address] Date: 09/21/2021 Prepared by: Rosana Hoes  Exercises - Gentle Upper Trap Stretch  - 1-2 x daily - 3 reps - 20 seconds hold - Gentle Levator Scapulae Stretch  - 1-2 x daily - 3 reps - 20 seconds hold - Supine Cervical Retraction with Towel  - 1-2 x daily - 2 sets - 10 reps - Supine Suboccipital Release with Tennis Balls  - 1-2 x daily - 2-3 minutes hold  Trigger Point Dry Needling  What is Trigger Point Dry Needling (DN)? DN is a physical therapy technique used to treat muscle pain and dysfunction. Specifically, DN helps deactivate muscle trigger points (muscle knots).  A thin filiform needle is used to penetrate the skin and stimulate the underlying trigger point. The goal is for a local twitch response (LTR) to occur and for the trigger point to relax. No medication of any kind is injected during the procedure.   What Does Trigger Point Dry Needling Feel Like?  The procedure feels different for each individual patient. Some patients report that they do not actually feel the needle enter the skin and overall the process is not painful. Very mild bleeding may occur. However, many patients feel a deep cramping in the muscle in which the needle was inserted. This is the local twitch response.   How Will I feel after the treatment? Soreness is normal, and the onset of soreness may not occur for a few hours. Typically this soreness does not last longer than two days.  Bruising is uncommon, however; ice can be used to decrease any possible bruising.  In rare cases feeling tired or nauseous after the treatment is normal. In addition, your symptoms may get worse before they get better, this period will typically not last longer than 24 hours.   What Can I do After My Treatment? Increase your hydration by drinking more water for the next 24 hours. You may place ice or heat on the areas treated that have become sore, however, do not  use heat on inflamed or bruised areas. Heat often brings more relief post needling. You can continue your regular activities, but vigorous activity is not recommended initially after the treatment for 24 hours. DN is best combined with other physical therapy such as strengthening, stretching, and other therapies.

## 2021-09-25 NOTE — Therapy (Signed)
OUTPATIENT PHYSICAL THERAPY TREATMENT NOTE   Patient Name: Kathy Nguyen MRN: [id number] DOB:1965-11-14, 56 y.o., female Today's Date: 09/26/2021  PCP: Lise Auer, MD   REFERRING PROVIDER: Donalee Citrin, MD  END OF SESSION:   PT End of Session - 09/26/21 1705     Visit Number 2    Number of Visits 8    Date for PT Re-Evaluation 11/16/21    Authorization Type [employer]    Authorization - Number of Visits 27    PT Start Time 1615    PT Stop Time 1700    PT Time Calculation (min) 45 min    Activity Tolerance Patient tolerated treatment well    Behavior During Therapy WFL for tasks assessed/performed             Past Medical History:  Diagnosis Date   Anxiety    Arthritis    Depression    Family history of adverse reaction to anesthesia    Father had a hard time awaking   GERD (gastroesophageal reflux disease)    Headache    History of hiatal hernia    Hypertension    Pneumonia    PONV (postoperative nausea and vomiting)    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 with f/u MRI 1 yr after   BREAST SURGERY  2008   Reduction & Lift   BREAST SURGERY     Implants   TOTAL HIP ARTHROPLASTY Right 09/08/2019   Procedure: RIGHT TOTAL HIP ARTHROPLASTY ANTERIOR APPROACH;  Surgeon: [name], MD;  Location: [facility] OR;  Service: Orthopedics;  Laterality: Right;   VULVA / PERINEUM BIOPSY Right    Patient Active Problem List   Diagnosis Date Noted   Status post total replacement of right hip 09/08/2019   Unilateral primary osteoarthritis, right hip 08/17/2019    REFERRING DIAG: Cervicalgia  THERAPY DIAG:  Cervicalgia  Muscle weakness (generalized)  Rationale for Evaluation and Treatment Rehabilitation  PERTINENT HISTORY: Anxiety, chronic neck and back pain with headaches  PRECAUTIONS: None  SUBJECTIVE: Patient states the day following her last visit (Friday) she  felt a little better. She states she has been consistent with the exercises and she feels they are helping.   PAIN:  Are you having pain? Yes:  NPRS scale: 5/10 neck, 6/10 headache  Pain location: Neck (left, base of skull), headaches (temporal) Pain description: Constant, aching, tightness, headaches throbbing Aggravating factors: Bending over, sleeping Relieving factors: Medication, heat / ice  PATIENT GOALS: Pain relief and reduce headaches   OBJECTIVE: (objective measures completed at initial evaluation unless otherwise dated) PATIENT SURVEYS:  NDI 39/50 disability   POSTURE: rounded shoulders and forward head    PALPATION: Tender left upper trap, cervical paraspinals, suboccipitals          CERVICAL ROM:    Active ROM A/PROM (deg) eval  Flexion 60  Extension 60  Right lateral flexion 40  Left lateral flexion 35  Right rotation 55  Left rotation 70   Patient notes pain will all movement  UPPER EXTREMITY MMT:   MMT Right eval Left eval  Shoulder flexion 4 4  Shoulder extension 4 4  Shoulder abduction 4 4  Shoulder internal rotation 4 4  Shoulder external rotation 4 4  Middle trapezius 3 3  Lower trapezius 3 3    CERVICAL SPECIAL TESTS:  DNF endurance 2 seconds  TODAY'S TREATMENT:  [facility] Adult PT Treatment:                                                DATE: 09/26/2021 Therapeutic Exercise: Upper trap stretch 2 x 20 sec Levator scap stretch 2 x 20 sec Supine chin tuck with small towel roll 10 x 5 sec Seated shoulder blade squeezes 10 x 5 sec Suboccipital SMFR using tennis balls, placing ball on pillow to keep in place and slightly elevate patients head Manual: Skilled palpation and monitoring of muscle tension while performing TPDN treatment STM left upper trap and cervical paraspinals Suboccipital release with gentle manual traction Passive upper trap and levator stretch Trigger Point Dry Needling Treatment: Pre-treatment instruction: Patient  instructed on dry needling rationale, procedures, and possible side effects including pain during treatment (achy,cramping feeling), bruising, drop of blood, lightheadedness, nausea, sweating. Patient Consent Given: Yes Education handout provided: Yes Muscles treated: left upper trap, cervical multifidi, suboccipitals  Needle size and number: .30x58mm x 4 and .25x53mm x 2 Electrical stimulation performed: No Parameters: N/A Treatment response/outcome: Twitch response elicited and Palpable decrease in muscle tension Post-treatment instructions: Patient instructed to expect possible mild to moderate muscle soreness later today and/or tomorrow. Patient instructed in methods to reduce muscle soreness and to continue prescribed HEP. If patient was dry needled over the lung field, patient was instructed on signs and symptoms of pneumothorax and, however unlikely, to see immediate medical attention should they occur. Patient was also educated on signs and symptoms of infection and to seek medical attention should they occur. Patient verbalized understanding of these instructions and education.   [facility] Adult PT Treatment:                                                DATE: 09/21/2021 Therapeutic Exercise: Upper trap stretch x 20 sec Levator scap stretch x 20 sec Supine chin tuck with small towel roll x 5 Suboccipital SMFR using tennis balls Trigger Point Dry Needling Treatment: Pre-treatment instruction: Patient instructed on dry needling rationale, procedures, and possible side effects including pain during treatment (achy,cramping feeling), bruising, drop of blood, lightheadedness, nausea, sweating. Patient Consent Given: Yes Education handout provided: Yes Muscles treated: left upper trap, splenius cervicis, suboccipitals  Needle size and number: .30x52mm x 2 and .25x81mm x 2 Electrical stimulation performed: No Parameters: N/A Treatment response/outcome: Twitch response elicited and Palpable  decrease in muscle tension Post-treatment instructions: Patient instructed to expect possible mild to moderate muscle soreness later today and/or tomorrow. Patient instructed in methods to reduce muscle soreness and to continue prescribed HEP. If patient was dry needled over the lung field, patient was instructed on signs and symptoms of pneumothorax and, however unlikely, to see immediate medical attention should they occur. Patient was also educated on signs and symptoms of infection and to seek medical attention should they occur. Patient verbalized understanding of these instructions and education.   PATIENT EDUCATION:  Education details: HEP, TPDN Person educated: Patient Education method: Explanation, Demonstration, Tactile cues, Verbal cues Education comprehension: verbalized understanding, returned demonstration, verbal cues required, tactile cues required, and needs further education   HOME EXERCISE PROGRAM: Access Code: [email or web address]     ASSESSMENT: CLINICAL IMPRESSION: Patient tolerated therapy well  with no adverse effects. She reported improvement with previous TPDN so continued treatment for left cervical region this visit with multiple twitch responses and reproduction of patient's symptoms. Therapy continues to focus on manual and gentle exercises to reduce cervical tension and improve postural control. She reported difficulty using tennis balls for SMFR of suboccipitals at home so worked on this and incorporated using pillow to keep ball in place and slightly elevate her head. No changes to HEP this visit. Patient would benefit from continued skilled PT to progress mobility and strength in order to reduce pain and headaches, and maximize functional ability.     OBJECTIVE IMPAIRMENTS decreased activity tolerance, decreased ROM, decreased strength, increased muscle spasms, postural dysfunction, and pain.    ACTIVITY LIMITATIONS bending, sitting, sleeping, and hygiene/grooming    PARTICIPATION LIMITATIONS: meal prep, cleaning, driving, shopping, community activity, and occupation   PERSONAL FACTORS Past/current experiences and Time since onset of injury/illness/exacerbation are also affecting patient's functional outcome.      GOALS: Goals reviewed with patient? Yes   SHORT TERM GOALS: Target date: 10/19/2021    Patient will be I with initial HEP in order to progress with therapy. Baseline: HEP provided at eval Goal status: INITIAL   2.  Patient will report </= 5/10 neck pain and headache in order to reduce functional limitation Baseline: 7/10 pain Goal status: INITIAL   3.  Patient will demonstrate DNF endurance >/= 10 seconds in order to improve sitting tolerance Baseline: DNF endurance 2 seconds Goal status: INITIAL   LONG TERM GOALS: Target date: 11/16/2021   Patient will be I with final HEP to maintain progress from PT. Baseline: HEP provided at eval Goal status: INITIAL   2.  Patient will report </= 25/50 disability on NDI in order to indicate improved functional ability Baseline: 39/50 disability Goal status: INITIAL   3.  Patient will demonstrate >/= 20 sec DNF endurance and periscapular strength >/= 4/5 MMT in order to improve postural control and reduce neck pain Baseline: DNF endurance 2 sec, periscapular strength 3/5 MMT Goal status: INITIAL   4.  Patient will report </= 3/10 neck pain and headaches in order to improve ability to perform social and recreational tasks Baseline: 7/10 pain Goal status: INITIAL     PLAN: PT FREQUENCY: 1-2x/week   PT DURATION: 8 weeks   PLANNED INTERVENTIONS: Therapeutic exercises, Therapeutic activity, Neuromuscular re-education, Balance training, Gait training, Patient/Family education, Joint manipulation, Joint mobilization, Aquatic Therapy, Dry Needling, Electrical stimulation, Spinal manipulation, Spinal mobilization, Cryotherapy, Moist heat, Taping, Manual therapy, and Re-evaluation   PLAN FOR NEXT  SESSION: Review and progress HEP PRN, dry needling/manual for cervical region, gentle cervical stretching, DNF endurance and progressive postural strengthening    Rosana Hoes, PT, DPT, LAT, ATC 09/26/21  5:11 PM Phone: [phone] Fax: [phone]

## 2021-09-26 ENCOUNTER — Other Ambulatory Visit: Payer: Self-pay

## 2021-09-26 ENCOUNTER — Encounter: Payer: Self-pay | Admitting: Physical Therapy

## 2021-09-26 ENCOUNTER — Other Ambulatory Visit: Payer: Self-pay | Admitting: Physician Assistant

## 2021-09-26 ENCOUNTER — Ambulatory Visit: Payer: Medicaid Other | Admitting: Physical Therapy

## 2021-09-26 DIAGNOSIS — M6281 Muscle weakness (generalized): Secondary | ICD-10-CM

## 2021-09-26 DIAGNOSIS — M542 Cervicalgia: Secondary | ICD-10-CM | POA: Diagnosis not present

## 2021-09-27 DIAGNOSIS — Z6826 Body mass index (BMI) 26.0-26.9, adult: Secondary | ICD-10-CM | POA: Diagnosis not present

## 2021-09-27 DIAGNOSIS — Z1331 Encounter for screening for depression: Secondary | ICD-10-CM | POA: Diagnosis not present

## 2021-09-27 DIAGNOSIS — G43909 Migraine, unspecified, not intractable, without status migrainosus: Secondary | ICD-10-CM | POA: Diagnosis not present

## 2021-09-27 DIAGNOSIS — F418 Other specified anxiety disorders: Secondary | ICD-10-CM | POA: Diagnosis not present

## 2021-09-28 ENCOUNTER — Ambulatory Visit: Payer: Medicaid Other | Admitting: Physical Therapy

## 2021-09-28 DIAGNOSIS — H5310 Unspecified subjective visual disturbances: Secondary | ICD-10-CM | POA: Diagnosis not present

## 2021-10-02 ENCOUNTER — Telehealth: Payer: Self-pay | Admitting: Physical Medicine and Rehabilitation

## 2021-10-02 NOTE — Telephone Encounter (Signed)
Patient called needing an injection in her left hip as soon as possible. The number to contact patient is [phone]

## 2021-10-03 ENCOUNTER — Other Ambulatory Visit: Payer: Self-pay

## 2021-10-03 ENCOUNTER — Ambulatory Visit: Payer: Medicaid Other | Admitting: Physical Therapy

## 2021-10-03 ENCOUNTER — Encounter: Payer: Self-pay | Admitting: Physical Therapy

## 2021-10-03 DIAGNOSIS — M6281 Muscle weakness (generalized): Secondary | ICD-10-CM

## 2021-10-03 DIAGNOSIS — M542 Cervicalgia: Secondary | ICD-10-CM | POA: Diagnosis not present

## 2021-10-05 ENCOUNTER — Ambulatory Visit: Payer: Medicaid Other | Admitting: Physical Therapy

## 2021-10-11 NOTE — Therapy (Signed)
OUTPATIENT PHYSICAL THERAPY TREATMENT NOTE   Patient Name: Kathy Nguyen MRN: [id number] DOB:11/02/1965, 56 y.o., female Today's Date: 10/12/2021  PCP: Lise Auer, MD   REFERRING PROVIDER: Donalee Citrin, MD  END OF SESSION:   PT End of Session - 10/12/21 1133     Visit Number 4    Number of Visits 8    Date for PT Re-Evaluation 11/16/21    Authorization Type [employer]    Authorization - Number of Visits 27    PT Start Time 1130    PT Stop Time 1215    PT Time Calculation (min) 45 min    Activity Tolerance Patient tolerated treatment well    Behavior During Therapy WFL for tasks assessed/performed               Past Medical History:  Diagnosis Date   Anxiety    Arthritis    Depression    Family history of adverse reaction to anesthesia    Father had a hard time awaking   GERD (gastroesophageal reflux disease)    Headache    History of hiatal hernia    Hypertension    Pneumonia    PONV (postoperative nausea and vomiting)    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 with f/u MRI 1 yr after   BREAST SURGERY  2008   Reduction & Lift   BREAST SURGERY     Implants   TOTAL HIP ARTHROPLASTY Right 09/08/2019   Procedure: RIGHT TOTAL HIP ARTHROPLASTY ANTERIOR APPROACH;  Surgeon: [name], MD;  Location: [facility] OR;  Service: Orthopedics;  Laterality: Right;   VULVA / PERINEUM BIOPSY Right    Patient Active Problem List   Diagnosis Date Noted   Status post total replacement of right hip 09/08/2019   Unilateral primary osteoarthritis, right hip 08/17/2019    REFERRING DIAG: Cervicalgia  THERAPY DIAG:  Cervicalgia  Muscle weakness (generalized)  Rationale for Evaluation and Treatment Rehabilitation  PERTINENT HISTORY: Anxiety, chronic neck and back pain with headaches  PRECAUTIONS: None  SUBJECTIVE: Patient reports she had to cancel last visit due to soreness,  but she is feeling better today. She remains consistent with her exercises at home.  PAIN:  Are you having pain? Yes:  NPRS scale: 5/10 neck, 6/10 headache  Pain location: Neck (left, base of skull), headaches (temporal) Pain description: Constant, aching, tightness, headaches throbbing Aggravating factors: Bending over, sleeping Relieving factors: Medication, heat / ice  PATIENT GOALS: Pain relief and reduce headaches   OBJECTIVE: (objective measures completed at initial evaluation unless otherwise dated) PATIENT SURVEYS:  NDI 39/50 disability   POSTURE: rounded shoulders and forward head    PALPATION: Tender left upper trap, cervical paraspinals, suboccipitals          CERVICAL ROM:    Active ROM A/PROM (deg) eval AROM  10/12/2021  Flexion 60   Extension 60   Right lateral flexion 40   Left lateral flexion 35   Right rotation 55 65  Left rotation 70 70   UPPER EXTREMITY MMT:   MMT Right eval Left eval  Shoulder flexion 4 4  Shoulder extension 4 4  Shoulder abduction 4 4  Shoulder internal rotation 4 4  Shoulder external rotation 4 4  Middle trapezius 3 3  Lower trapezius 3 3    CERVICAL SPECIAL TESTS:  DNF endurance 2 seconds  TODAY'S TREATMENT:  [facility] Adult PT Treatment:                                                DATE: 10/12/2021 Therapeutic Exercise: UBE L1 x 4 min (fwd/bwd) while taking subjective Supine chin tuck with single pillow 10 x 5 sec Supine horizontal abduction with red 2 x 10 Supine scapular protraction/retraction at 90 deg shoulder flexion with chin tuck 2 x 10 Seated double ER and scap retraction with red 2 x 10 Manual: Skilled palpation and monitoring of muscle tension while performing TPDN treatment Suboccipital release with gentle manual traction Passive upper trap and levator stretch Trigger Point Dry Needling Treatment: Pre-treatment instruction: Patient instructed on dry needling rationale, procedures, and possible side  effects including pain during treatment (achy,cramping feeling), bruising, drop of blood, lightheadedness, nausea, sweating. Patient Consent Given: Yes Education handout provided: Yes Muscles treated: left upper trap, bilateral suboccipitals  Needle size and number: .30x39mm x 1 and .25x93mm x 2 Electrical stimulation performed: No Parameters: N/A Treatment response/outcome: Twitch response elicited and Palpable decrease in muscle tension Post-treatment instructions: Patient instructed to expect possible mild to moderate muscle soreness later today and/or tomorrow. Patient instructed in methods to reduce muscle soreness and to continue prescribed HEP. If patient was dry needled over the lung field, patient was instructed on signs and symptoms of pneumothorax and, however unlikely, to see immediate medical attention should they occur. Patient was also educated on signs and symptoms of infection and to seek medical attention should they occur. Patient verbalized understanding of these instructions and education.   [facility] Adult PT Treatment:                                                DATE: 10/03/2021 Therapeutic Exercise: UBE L1 x 4 min (fwd/bwd) while taking subjective Supine chin tuck with single pillow 10 x 5 sec Supine horizontal abduction with yellow 2 x 10 Supine scapular protraction/retraction at 90 deg shoulder flexion with chin tuck x 10 Seated double ER and scap retraction with yellow 2 x 10 Sidelying thoracic rotation x 10 each Manual: Suboccipital release with gentle manual traction Passive cervical retractions, upper cervical rotation in full flexion, upper trap and levator stretch  [facility] Adult PT Treatment:                                                DATE: 09/26/2021 Therapeutic Exercise: Upper trap stretch 2 x 20 sec Levator scap stretch 2 x 20 sec Supine chin tuck with small towel roll 10 x 5 sec Seated shoulder blade squeezes 10 x 5 sec Suboccipital SMFR using tennis  balls, placing ball on pillow to keep in place and slightly elevate patients head Manual: Skilled palpation and monitoring of muscle tension while performing TPDN treatment STM left upper trap and cervical paraspinals Suboccipital release with gentle manual traction Passive upper trap and levator stretch Trigger Point Dry Needling Treatment: Pre-treatment instruction: Patient instructed on dry needling rationale, procedures, and possible side effects including pain during treatment (achy,cramping feeling), bruising, drop of blood, lightheadedness, nausea, sweating. Patient  Consent Given: Yes Education handout provided: Yes Muscles treated: left upper trap, cervical multifidi, suboccipitals  Needle size and number: .30x84mm x 4 and .25x77mm x 2 Electrical stimulation performed: No Parameters: N/A Treatment response/outcome: Twitch response elicited and Palpable decrease in muscle tension Post-treatment instructions: Patient instructed to expect possible mild to moderate muscle soreness later today and/or tomorrow. Patient instructed in methods to reduce muscle soreness and to continue prescribed HEP. If patient was dry needled over the lung field, patient was instructed on signs and symptoms of pneumothorax and, however unlikely, to see immediate medical attention should they occur. Patient was also educated on signs and symptoms of infection and to seek medical attention should they occur. Patient verbalized understanding of these instructions and education.   PATIENT EDUCATION:  Education details: HEP update Person educated: Patient Education method: Explanation, Demonstration, Tactile cues, Verbal cues, Handout Education comprehension: verbalized understanding, returned demonstration, verbal cues required, tactile cues required, and needs further education   HOME EXERCISE PROGRAM: Access Code: [email or web address]     ASSESSMENT: CLINICAL IMPRESSION: Patient tolerated therapy well with no adverse  effects. She does exhibit improved cervical rotation this visit. Performed TPDN this visit for bilateral suboccipitals and left upper trap with twitch response in upper trap and patient reporting reproduction of symptoms in suboccipitals. Continued with postural endurance and control exercises, and she was able to tolerate increased resistance with postural exercises. Updated HEP to progress strengthening at home and educated patient on expected soreness with exercises. Patient would benefit from continued skilled PT to progress mobility and strength in order to reduce pain and headaches, and maximize functional ability.     OBJECTIVE IMPAIRMENTS decreased activity tolerance, decreased ROM, decreased strength, increased muscle spasms, postural dysfunction, and pain.    ACTIVITY LIMITATIONS bending, sitting, sleeping, and hygiene/grooming   PARTICIPATION LIMITATIONS: meal prep, cleaning, driving, shopping, community activity, and occupation   PERSONAL FACTORS Past/current experiences and Time since onset of injury/illness/exacerbation are also affecting patient's functional outcome.      GOALS: Goals reviewed with patient? Yes   SHORT TERM GOALS: Target date: 10/19/2021    Patient will be I with initial HEP in order to progress with therapy. Baseline: HEP provided at eval Goal status: INITIAL   2.  Patient will report </= 5/10 neck pain and headache in order to reduce functional limitation Baseline: 7/10 pain Goal status: INITIAL   3.  Patient will demonstrate DNF endurance >/= 10 seconds in order to improve sitting tolerance Baseline: DNF endurance 2 seconds Goal status: INITIAL   LONG TERM GOALS: Target date: 11/16/2021   Patient will be I with final HEP to maintain progress from PT. Baseline: HEP provided at eval Goal status: INITIAL   2.  Patient will report </= 25/50 disability on NDI in order to indicate improved functional ability Baseline: 39/50 disability Goal status:  INITIAL   3.  Patient will demonstrate >/= 20 sec DNF endurance and periscapular strength >/= 4/5 MMT in order to improve postural control and reduce neck pain Baseline: DNF endurance 2 sec, periscapular strength 3/5 MMT Goal status: INITIAL   4.  Patient will report </= 3/10 neck pain and headaches in order to improve ability to perform social and recreational tasks Baseline: 7/10 pain Goal status: INITIAL     PLAN: PT FREQUENCY: 1-2x/week   PT DURATION: 8 weeks   PLANNED INTERVENTIONS: Therapeutic exercises, Therapeutic activity, Neuromuscular re-education, Balance training, Gait training, Patient/Family education, Joint manipulation, Joint mobilization, Aquatic Therapy, Dry Needling, Electrical stimulation, Spinal  manipulation, Spinal mobilization, Cryotherapy, Moist heat, Taping, Manual therapy, and Re-evaluation   PLAN FOR NEXT SESSION: Review and progress HEP PRN, dry needling/manual for cervical region, gentle cervical stretching, DNF endurance and progressive postural strengthening    Rosana Hoes, PT, DPT, LAT, ATC 10/12/21  12:26 PM Phone: [phone] Fax: [phone]

## 2021-10-12 ENCOUNTER — Ambulatory Visit: Payer: Medicaid Other | Attending: Neurosurgery | Admitting: Physical Therapy

## 2021-10-12 ENCOUNTER — Other Ambulatory Visit: Payer: Self-pay

## 2021-10-12 ENCOUNTER — Encounter: Payer: Self-pay | Admitting: Physical Therapy

## 2021-10-12 DIAGNOSIS — H5310 Unspecified subjective visual disturbances: Secondary | ICD-10-CM | POA: Diagnosis not present

## 2021-10-12 DIAGNOSIS — G43909 Migraine, unspecified, not intractable, without status migrainosus: Secondary | ICD-10-CM | POA: Diagnosis not present

## 2021-10-12 DIAGNOSIS — H5713 Ocular pain, bilateral: Secondary | ICD-10-CM | POA: Diagnosis not present

## 2021-10-12 DIAGNOSIS — M6281 Muscle weakness (generalized): Secondary | ICD-10-CM | POA: Diagnosis present

## 2021-10-12 DIAGNOSIS — M542 Cervicalgia: Secondary | ICD-10-CM | POA: Diagnosis present

## 2021-10-12 NOTE — Patient Instructions (Signed)
Access Code: [email or web address] URL: [email or web address] Date: 10/12/2021 Prepared by: Rosana Hoes  Exercises - Gentle Upper Trap Stretch  - 1-2 x daily - 3 reps - 20 seconds hold - Gentle Levator Scapulae Stretch  - 1-2 x daily - 3 reps - 20 seconds hold - Supine Cervical Retraction with Towel  - 1-2 x daily - 2 sets - 10 reps - Supine Suboccipital Release with Tennis Balls  - 1-2 x daily - 2-3 minutes hold - Supine Shoulder Horizontal Abduction with Resistance  - 1 x daily - 2 sets - 10 reps - Supine Bilateral Shoulder Protraction  - 1 x daily - 2 sets - 10 reps - Shoulder External Rotation and Scapular Retraction with Resistance  - 1 x daily - 2 sets - 10 reps

## 2021-10-17 NOTE — Therapy (Signed)
OUTPATIENT PHYSICAL THERAPY TREATMENT NOTE   Patient Name: Kathy Nguyen MRN: [id number] DOB:1965/11/21, 56 y.o., female Today's Date: 10/18/2021  PCP: Mateo Flow, MD   REFERRING PROVIDER: Kary Kos, MD   END OF SESSION:   PT End of Session - 10/18/21 1053     Visit Number 5    Number of Visits 8    Date for PT Re-Evaluation 11/16/21    Authorization Type [employer]    Authorization - Number of Visits 62    PT Start Time 1048    PT Stop Time 1130    PT Time Calculation (min) 42 min    Activity Tolerance Patient tolerated treatment well    Behavior During Therapy WFL for tasks assessed/performed                Past Medical History:  Diagnosis Date   Anxiety    Arthritis    Depression    Family history of adverse reaction to anesthesia    Father had a hard time awaking   GERD (gastroesophageal reflux disease)    Headache    History of hiatal hernia    Hypertension    Pneumonia    PONV (postoperative nausea and vomiting)    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 with f/u MRI 1 yr after   BREAST SURGERY  2008   Reduction & Lift   BREAST SURGERY     Implants   TOTAL HIP ARTHROPLASTY Right 09/08/2019   Procedure: RIGHT TOTAL HIP ARTHROPLASTY ANTERIOR APPROACH;  Surgeon: Mcarthur Rossetti, MD;  Location: [place];  Service: Orthopedics;  Laterality: Right;   VULVA / PERINEUM BIOPSY Right    Patient Active Problem List   Diagnosis Date Noted   Status post total replacement of right hip 09/08/2019   Unilateral primary osteoarthritis, right hip 08/17/2019    REFERRING DIAG: Cervicalgia  THERAPY DIAG:  Cervicalgia  Muscle weakness (generalized)  Rationale for Evaluation and Treatment Rehabilitation  PERTINENT HISTORY: Anxiety, chronic neck and back pain with headaches  PRECAUTIONS: None   SUBJECTIVE: Patient reports is doing well, she is consistent with  her exercises and she feels the stretching has helped. The headache throbbing has improved, she does continue to have neck stiffness mainly in the morning at the base of the skull.   PAIN:  Are you having pain? Yes:  NPRS scale: 4/10 neck, 2/10 headache  Pain location: Neck (left, base of skull), headaches (temporal) Pain description: Constant, aching, tightness, headaches throbbing Aggravating factors: Bending over, sleeping Relieving factors: Medication, heat / ice  PATIENT GOALS: Pain relief and reduce headaches   OBJECTIVE: (objective measures completed at initial evaluation unless otherwise dated) PATIENT SURVEYS:  NDI 39/50 disability   POSTURE: rounded shoulders and forward head    PALPATION: Tender left upper trap, cervical paraspinals, suboccipitals          CERVICAL ROM:    Active ROM A/PROM (deg) eval AROM  10/12/2021  Flexion 60   Extension 60   Right lateral flexion 40   Left lateral flexion 35   Right rotation 55 65  Left rotation 70 70   UPPER EXTREMITY MMT:   MMT Right eval Left eval  Shoulder flexion 4 4  Shoulder extension 4 4  Shoulder abduction 4 4  Shoulder internal rotation 4 4  Shoulder external rotation 4 4  Middle trapezius 3 3  Lower trapezius 3 3    CERVICAL SPECIAL TESTS:  DNF endurance 2 seconds  10/18/2021: 9 seconds     TODAY'S TREATMENT:  [facility] Adult PT Treatment:                                                DATE: 10/18/2021 Therapeutic Exercise: UBE L1 x 4 min (fwd/bwd) while taking subjective Supine chin tuck with single pillow 10 x 5 sec Supine scapular protraction/retraction at 90 deg shoulder flexion with chin tuck 2 x 10 Seated cervical retraction with yellow 2 x 8 Row with green 2 x 10 Manual: Skilled palpation and monitoring of muscle tension while performing TPDN treatment Suboccipital release with gentle manual traction Trigger Point Dry Needling Treatment: Pre-treatment instruction: Patient instructed on dry  needling rationale, procedures, and possible side effects including pain during treatment (achy,cramping feeling), bruising, drop of blood, lightheadedness, nausea, sweating. Patient Consent Given: Yes Education handout provided: Yes Muscles treated: bilateral upper trap, bilateral suboccipitals  Needle size and number: .30x63m x 3 and .25x360mx 2 Electrical stimulation performed: No Parameters: N/A Treatment response/outcome: Twitch response elicited and Palpable decrease in muscle tension Post-treatment instructions: Patient instructed to expect possible mild to moderate muscle soreness later today and/or tomorrow. Patient instructed in methods to reduce muscle soreness and to continue prescribed HEP. If patient was dry needled over the lung field, patient was instructed on signs and symptoms of pneumothorax and, however unlikely, to see immediate medical attention should they occur. Patient was also educated on signs and symptoms of infection and to seek medical attention should they occur. Patient verbalized understanding of these instructions and education.   [facility]                                                DATE: 10/12/2021 Therapeutic Exercise: UBE L1 x 4 min (fwd/bwd) while taking subjective Supine chin tuck with single pillow 10 x 5 sec Supine horizontal abduction with red 2 x 10 Supine scapular protraction/retraction at 90 deg shoulder flexion with chin tuck 2 x 10 Seated double ER and scap retraction with red 2 x 10 Manual: Skilled palpation and monitoring of muscle tension while performing TPDN treatment Suboccipital release with gentle manual traction Passive upper trap and levator stretch Trigger Point Dry Needling Treatment: Pre-treatment instruction: Patient instructed on dry needling rationale, procedures, and possible side effects including pain during treatment (achy,cramping feeling), bruising, drop of blood, lightheadedness, nausea, sweating. Patient  Consent Given: Yes Education handout provided: Yes Muscles treated: left upper trap, bilateral suboccipitals  Needle size and number: .30x5065m 1 and .25x30m60m2 Electrical stimulation performed: No Parameters: N/A Treatment response/outcome: Twitch response elicited and Palpable decrease in muscle tension Post-treatment instructions: Patient instructed to expect possible mild to moderate muscle soreness later today and/or tomorrow. Patient instructed in methods to reduce muscle soreness and to continue prescribed HEP. If patient was dry needled over the lung field, patient was instructed on signs and symptoms of pneumothorax and, however unlikely, to see immediate medical attention should they occur. Patient was also educated on signs and symptoms of infection and to seek medical attention should they occur. Patient verbalized understanding of these instructions and education.  [facility] Adult PT Treatment:                                                DATE: 10/03/2021 Therapeutic Exercise: UBE L1 x 4 min (fwd/bwd) while taking subjective Supine chin tuck with single pillow 10 x 5 sec Supine horizontal abduction with yellow 2 x 10 Supine scapular protraction/retraction at 90 deg shoulder flexion with chin tuck x 10 Seated double ER and scap retraction with yellow 2 x 10 Sidelying thoracic rotation x 10 each Manual: Suboccipital release with gentle manual traction Passive cervical retractions, upper cervical rotation in full flexion, upper trap and levator stretch   PATIENT EDUCATION:  Education details: HEP Person educated: Patient Education method: Consulting civil engineer, Media planner, Corporate treasurer cues, Verbal cues Education comprehension: verbalized understanding, returned demonstration, verbal cues required, tactile cues required, and needs further education   HOME EXERCISE PROGRAM: Access Code: [email or web address]     ASSESSMENT: CLINICAL IMPRESSION: Patient tolerated therapy well with no adverse effects.  Continued with TPDN this visit since patient reports continued benefit, elicited twitch response and patient reported less tenderness with suboccipital region. Continued to progress postural control exercises and added resistance for her DNF exercise in seated and progressing resisted periscapular strengthening. Patient did note soreness following therapy and does require consistent cueing for postural control while performing exercises. She does exhibit improved improved DNF endurance this visit. No changes to HEP this visit. Patient would benefit from continued skilled PT to progress mobility and strength in order to reduce pain and headaches, and maximize functional ability.     OBJECTIVE IMPAIRMENTS decreased activity tolerance, decreased ROM, decreased strength, increased muscle spasms, postural dysfunction, and pain.    ACTIVITY LIMITATIONS bending, sitting, sleeping, and hygiene/grooming   PARTICIPATION LIMITATIONS: meal prep, cleaning, driving, shopping, community activity, and occupation   PERSONAL FACTORS Past/current experiences and Time since onset of injury/illness/exacerbation are also affecting patient's functional outcome.      GOALS: Goals reviewed with patient? Yes   SHORT TERM GOALS: Target date: 10/19/2021    Patient will be I with initial HEP in order to progress with therapy. Baseline: HEP provided at eval 10/18/2021: patient independent with initial HEP Goal status: MET   2.  Patient will report </= 5/10 neck pain and headache in order to reduce functional limitation Baseline: 7/10 pain 10/18/2021: patient reports pain </= 4/10  Goal status: MET   3.  Patient will demonstrate DNF endurance >/= 10 seconds in order to improve sitting tolerance Baseline: DNF endurance 2 seconds 10/18/2021: DNF endurance 9 sec Goal status: PARTIALLY MET   LONG TERM GOALS: Target date: 11/16/2021   Patient will be I with final HEP to maintain progress from PT. Baseline: HEP provided at  eval Goal status: INITIAL   2.  Patient will report </= 25/50 disability on NDI in order to indicate improved functional ability Baseline: 39/50 disability Goal status: INITIAL   3.  Patient will demonstrate >/= 20 sec DNF endurance and periscapular strength >/= 4/5 MMT in order to improve postural control and reduce neck pain Baseline: DNF endurance 2 sec, periscapular strength 3/5 MMT Goal status: INITIAL   4.  Patient will report </= 3/10 neck pain and headaches in order to improve ability to perform social and recreational tasks Baseline: 7/10 pain Goal status: INITIAL     PLAN: PT FREQUENCY: 1-2x/week   PT DURATION:  8 weeks   PLANNED INTERVENTIONS: Therapeutic exercises, Therapeutic activity, Neuromuscular re-education, Balance training, Gait training, Patient/Family education, Joint manipulation, Joint mobilization, Aquatic Therapy, Dry Needling, Electrical stimulation, Spinal manipulation, Spinal mobilization, Cryotherapy, Moist heat, Taping, Manual therapy, and Re-evaluation   PLAN FOR NEXT SESSION: Review and progress HEP PRN, dry needling/manual for cervical region, gentle cervical stretching, DNF endurance and progressive postural strengthening    Hilda Blades, PT, DPT, LAT, ATC 10/18/21  1:29 PM Phone: [phone] Fax: [phone]

## 2021-10-18 ENCOUNTER — Encounter: Payer: Self-pay | Admitting: Physical Therapy

## 2021-10-18 ENCOUNTER — Other Ambulatory Visit: Payer: Self-pay

## 2021-10-18 ENCOUNTER — Ambulatory Visit: Payer: Medicaid Other | Admitting: Physical Therapy

## 2021-10-18 DIAGNOSIS — M6281 Muscle weakness (generalized): Secondary | ICD-10-CM

## 2021-10-18 DIAGNOSIS — M542 Cervicalgia: Secondary | ICD-10-CM | POA: Diagnosis not present

## 2021-10-20 ENCOUNTER — Encounter: Payer: Self-pay | Admitting: Physical Medicine and Rehabilitation

## 2021-10-20 ENCOUNTER — Ambulatory Visit: Payer: Self-pay

## 2021-10-20 ENCOUNTER — Ambulatory Visit (INDEPENDENT_AMBULATORY_CARE_PROVIDER_SITE_OTHER): Payer: Medicaid Other | Admitting: Physical Medicine and Rehabilitation

## 2021-10-20 DIAGNOSIS — M25552 Pain in left hip: Secondary | ICD-10-CM | POA: Diagnosis not present

## 2021-10-20 NOTE — Progress Notes (Unsigned)
   Kathy Nguyen - 56 y.o. female MRN [id number]  Date of birth: 1965-11-17  Office Visit Note: Visit Date: 10/20/2021 PCP: Lise Auer, MD Referred by: Lise Auer, MD  Subjective: Chief Complaint  Patient presents with   Left Hip - Pain   HPI:  Kathy Nguyen is a 56 y.o. female who comes in todayHPI ROS Otherwise per HPI.  Assessment & Plan: Visit Diagnoses:    ICD-10-CM   1. Pain in left hip  M25.552 XR C-ARM NO REPORT      Plan: No additional findings.   Meds & Orders: No orders of the defined types were placed in this encounter.   Orders Placed This Encounter  Procedures   Large Joint Inj   XR C-ARM NO REPORT    Follow-up: No follow-ups on file.   Procedures: Large Joint Inj: L hip joint on 10/20/2021 8:11 AM Indications: diagnostic evaluation and pain Details: 22 G 3.5 in needle, fluoroscopy-guided anterior approach  Arthrogram: No  Medications: 4 mL bupivacaine 0.25 %; 60 mg triamcinolone acetonide 40 MG/ML Outcome: tolerated well, no immediate complications  There was excellent flow of contrast producing a partial arthrogram of the hip. The patient did have relief of symptoms during the anesthetic phase of the injection. Procedure, treatment alternatives, risks and benefits explained, specific risks discussed. Consent was given by the patient. Immediately prior to procedure a time out was called to verify the correct patient, procedure, equipment, support staff and site/side marked as required. Patient was prepped and draped in the usual sterile fashion.          Clinical History: No specialty comments available.     Objective:  VS:  HT:    WT:   BMI:     BP:   HR: bpm  TEMP: ( )  RESP:  Physical Exam   Imaging: No results found.

## 2021-10-20 NOTE — Progress Notes (Unsigned)
Pt state left hip pain. Pt state sitting and bending makes the pain worse. Pt state she takes over the counter pain meds to help ease her pain.  Numeric Pain Rating Scale and Functional Assessment Average Pain 5   In the last MONTH (on 0-10 scale) has pain interfered with the following?  1. General activity like being  able to carry out your everyday physical activities such as walking, climbing stairs, carrying groceries, or moving a chair?  Rating(9)   \ -BT, -Dye Allergies.

## 2021-10-24 MED ORDER — BUPIVACAINE HCL 0.25 % IJ SOLN
4.0000 mL | INTRAMUSCULAR | Status: AC | PRN
  Administered 2021-10-20: 4 mL via INTRA_ARTICULAR

## 2021-10-24 MED ORDER — TRIAMCINOLONE ACETONIDE 40 MG/ML IJ SUSP
60.0000 mg | INTRAMUSCULAR | Status: AC | PRN
  Administered 2021-10-20: 60 mg via INTRA_ARTICULAR

## 2021-10-24 NOTE — Therapy (Signed)
OUTPATIENT PHYSICAL THERAPY TREATMENT NOTE   Patient Name: Kathy Nguyen MRN: [id number] DOB:06/11/1965, 56 y.o., female Today's Date: 10/25/2021  PCP: Mateo Flow, MD   REFERRING PROVIDER: Kary Kos, MD   END OF SESSION:   PT End of Session - 10/25/21 1045     Visit Number 6    Number of Visits 8    Date for PT Re-Evaluation 11/16/21    Authorization Type [employer]    Authorization - Number of Visits 42    PT Start Time 1045    PT Stop Time 1130    PT Time Calculation (min) 45 min    Activity Tolerance Patient tolerated treatment well    Behavior During Therapy WFL for tasks assessed/performed                 Past Medical History:  Diagnosis Date   Anxiety    Arthritis    Depression    Family history of adverse reaction to anesthesia    Father had a hard time awaking   GERD (gastroesophageal reflux disease)    Headache    History of hiatal hernia    Hypertension    Pneumonia    PONV (postoperative nausea and vomiting)    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 with f/u MRI 1 yr after   BREAST SURGERY  2008   Reduction & Lift   BREAST SURGERY     Implants   TOTAL HIP ARTHROPLASTY Right 09/08/2019   Procedure: RIGHT TOTAL HIP ARTHROPLASTY ANTERIOR APPROACH;  Surgeon: Mcarthur Rossetti, MD;  Location: [facility];  Service: Orthopedics;  Laterality: Right;   VULVA / PERINEUM BIOPSY Right    Patient Active Problem List   Diagnosis Date Noted   Status post total replacement of right hip 09/08/2019   Unilateral primary osteoarthritis, right hip 08/17/2019    REFERRING DIAG: Cervicalgia  THERAPY DIAG:  Cervicalgia  Muscle weakness (generalized)  Rationale for Evaluation and Treatment Rehabilitation  PERTINENT HISTORY: Anxiety, chronic neck and back pain with headaches  PRECAUTIONS: None   SUBJECTIVE: Patient reports is doing well, she was sore for a  couple of days after last visit. She states that her neck is very stiff this morning from trying to hang wallpaper over weekend.   PAIN:  Are you having pain? Yes:  NPRS scale: 7/10 neck, 4/10 headache  Pain location: Neck (left, base of skull), headaches (temporal) Pain description: Constant, aching, tightness, headaches throbbing Aggravating factors: Bending over, sleeping Relieving factors: Medication, heat / ice  PATIENT GOALS: Pain relief and reduce headaches   OBJECTIVE: (objective measures completed at initial evaluation unless otherwise dated) PATIENT SURVEYS:  NDI 39/50 disability   POSTURE: rounded shoulders and forward head    PALPATION: Tender left upper trap, cervical paraspinals, suboccipitals          CERVICAL ROM:    Active ROM A/PROM (deg) eval AROM  10/12/2021  Flexion 60   Extension 60   Right lateral flexion 40   Left lateral flexion 35   Right rotation 55 65  Left rotation 70 70   UPPER EXTREMITY MMT:   MMT Right eval Left eval  Shoulder flexion 4 4  Shoulder extension 4 4  Shoulder abduction 4 4  Shoulder internal rotation 4 4  Shoulder external rotation 4 4  Middle trapezius 3 3  Lower trapezius 3 3  CERVICAL SPECIAL TESTS:  DNF endurance 2 seconds  10/18/2021: 9 seconds     TODAY'S TREATMENT:  [facility] Adult PT Treatment:                                                DATE: 10/25/2021 Therapeutic Exercise: UBE L1 x 4 min (fwd/bwd) while taking subjective Supine chin tuck with single pillow 10 x 5 sec Sidelying thoracic rotation x 10 each Child's pose stretch 3 x 20 sec Supine scapular protraction/retraction at 90 deg shoulder flexion with chin tuck 2 x 10 - with MHP Supine horizontal abduction with yellow - with MHP Seated upper trap and levator scap stretch 2 x 20 sec each Manual: Skilled palpation and monitoring of muscle tension while performing TPDN treatment Suboccipital release with gentle manual traction Trigger Point Dry  Needling Treatment: Pre-treatment instruction: Patient instructed on dry needling rationale, procedures, and possible side effects including pain during treatment (achy,cramping feeling), bruising, drop of blood, lightheadedness, nausea, sweating. Patient Consent Given: Yes Education handout provided: Yes Muscles treated:bilateral suboccipitals and cervical multifidi, left temporalis Needle size and number: .30x46m x 3 and .25x340mx 2 Electrical stimulation performed: No Parameters: N/A Treatment response/outcome: Twitch response elicited and Palpable decrease in muscle tension Post-treatment instructions: Patient instructed to expect possible mild to moderate muscle soreness later today and/or tomorrow. Patient instructed in methods to reduce muscle soreness and to continue prescribed HEP. If patient was dry needled over the lung field, patient was instructed on signs and symptoms of pneumothorax and, however unlikely, to see immediate medical attention should they occur. Patient was also educated on signs and symptoms of infection and to seek medical attention should they occur. Patient verbalized understanding of these instructions and education.   [facility]                                                [facility]: [facility] Therapeutic Exercise: UBE L1 x 4 min (fwd/bwd) while taking subjective Supine chin tuck with single pillow 10 x 5 sec Supine scapular protraction/retraction at 90 deg shoulder flexion with chin tuck 2 x 10 Seated cervical retraction with yellow 2 x 8 Row with green 2 x 10 Manual: Skilled palpation and monitoring of muscle tension while performing TPDN treatment Suboccipital release with gentle manual traction Trigger Point Dry Needling Treatment: Pre-treatment instruction: Patient instructed on dry needling rationale, procedures, and possible side effects including pain during treatment (achy,cramping feeling), bruising, drop of blood, lightheadedness,  nausea, sweating. Patient Consent Given: Yes Education handout provided: Yes Muscles treated: bilateral upper trap, bilateral suboccipitals  Needle size and number: .30x5040m 3 and .25x30m65m2 Electrical stimulation performed: No Parameters: N/A Treatment response/outcome: Twitch response elicited and Palpable decrease in muscle tension Post-treatment instructions: Patient instructed to expect possible mild to moderate muscle soreness later today and/or tomorrow. Patient instructed in methods to reduce muscle soreness and to continue prescribed HEP. If patient was dry needled over the lung field, patient was instructed on signs and symptoms of pneumothorax and, however unlikely, to see immediate medical attention should they occur. Patient was also educated on signs and symptoms of infection and to seek medical attention should they occur. Patient verbalized understanding of these  instructions and education.  [facility] Adult PT Treatment:                                                DATE: 10/12/2021 Therapeutic Exercise: UBE L1 x 4 min (fwd/bwd) while taking subjective Supine chin tuck with single pillow 10 x 5 sec Supine horizontal abduction with red 2 x 10 Supine scapular protraction/retraction at 90 deg shoulder flexion with chin tuck 2 x 10 Seated double ER and scap retraction with red 2 x 10 Manual: Skilled palpation and monitoring of muscle tension while performing TPDN treatment Suboccipital release with gentle manual traction Passive upper trap and levator stretch Trigger Point Dry Needling Treatment: Pre-treatment instruction: Patient instructed on dry needling rationale, procedures, and possible side effects including pain during treatment (achy,cramping feeling), bruising, drop of blood, lightheadedness, nausea, sweating. Patient Consent Given: Yes Education handout provided: Yes Muscles treated: left upper trap, bilateral suboccipitals  Needle size and number: .30x31m x 1 and  .25x329mx 2 Electrical stimulation performed: No Parameters: N/A Treatment response/outcome: Twitch response elicited and Palpable decrease in muscle tension Post-treatment instructions: Patient instructed to expect possible mild to moderate muscle soreness later today and/or tomorrow. Patient instructed in methods to reduce muscle soreness and to continue prescribed HEP. If patient was dry needled over the lung field, patient was instructed on signs and symptoms of pneumothorax and, however unlikely, to see immediate medical attention should they occur. Patient was also educated on signs and symptoms of infection and to seek medical attention should they occur. Patient verbalized understanding of these instructions and education.   PATIENT EDUCATION:  Education details: HEP Person educated: Patient Education method: ExEducation officer, environmentalTaCorporate treasurerues, Verbal cues Education comprehension: verbalized understanding, returned demonstration, verbal cues required, tactile cues required, and needs further education   HOME EXERCISE PROGRAM: Access Code: [email or web address]     ASSESSMENT: CLINICAL IMPRESSION: Patient tolerated therapy well with no adverse effects. Continued with TPDN this visit and incorporated temporalis needling on the left to see if this helped her temporal headaches, which did reproduce concordant symptoms. She did report feeling more tired and tight this visit from recent steroid injection in left hip. She was able to complete all prescribed exercises but continues to report persistent neck stiffness. Utilized MHP with some supine exercises to reduce tightness. No changes made to HEP. Patient would benefit from continued skilled PT to progress mobility and strength in order to reduce pain and headaches, and maximize functional ability.     OBJECTIVE IMPAIRMENTS decreased activity tolerance, decreased ROM, decreased strength, increased muscle spasms, postural dysfunction, and pain.     ACTIVITY LIMITATIONS bending, sitting, sleeping, and hygiene/grooming   PARTICIPATION LIMITATIONS: meal prep, cleaning, driving, shopping, community activity, and occupation   PERSONAL FACTORS Past/current experiences and Time since onset of injury/illness/exacerbation are also affecting patient's functional outcome.      GOALS: Goals reviewed with patient? Yes   SHORT TERM GOALS: Target date: 10/19/2021    Patient will be I with initial HEP in order to progress with therapy. Baseline: HEP provided at eval 10/18/2021: patient independent with initial HEP Goal status: MET   2.  Patient will report </= 5/10 neck pain and headache in order to reduce functional limitation Baseline: 7/10 pain 10/18/2021: patient reports pain </= 4/10  Goal status: MET   3.  Patient will demonstrate DNF endurance >/=  10 seconds in order to improve sitting tolerance Baseline: DNF endurance 2 seconds 10/18/2021: DNF endurance 9 sec Goal status: PARTIALLY MET   LONG TERM GOALS: Target date: 11/16/2021   Patient will be I with final HEP to maintain progress from PT. Baseline: HEP provided at eval Goal status: INITIAL   2.  Patient will report </= 25/50 disability on NDI in order to indicate improved functional ability Baseline: 39/50 disability Goal status: INITIAL   3.  Patient will demonstrate >/= 20 sec DNF endurance and periscapular strength >/= 4/5 MMT in order to improve postural control and reduce neck pain Baseline: DNF endurance 2 sec, periscapular strength 3/5 MMT Goal status: INITIAL   4.  Patient will report </= 3/10 neck pain and headaches in order to improve ability to perform social and recreational tasks Baseline: 7/10 pain Goal status: INITIAL     PLAN: PT FREQUENCY: 1-2x/week   PT DURATION: 8 weeks   PLANNED INTERVENTIONS: Therapeutic exercises, Therapeutic activity, Neuromuscular re-education, Balance training, Gait training, Patient/Family education, Joint manipulation, Joint  mobilization, Aquatic Therapy, Dry Needling, Electrical stimulation, Spinal manipulation, Spinal mobilization, Cryotherapy, Moist heat, Taping, Manual therapy, and Re-evaluation   PLAN FOR NEXT SESSION: Review and progress HEP PRN, dry needling/manual for cervical region, gentle cervical stretching, DNF endurance and progressive postural strengthening    Hilda Blades, PT, DPT, LAT, ATC 10/25/21  11:32 AM Phone: [phone] Fax: [phone]

## 2021-10-25 ENCOUNTER — Other Ambulatory Visit: Payer: Self-pay

## 2021-10-25 ENCOUNTER — Ambulatory Visit: Payer: Medicaid Other | Admitting: Physical Therapy

## 2021-10-25 ENCOUNTER — Encounter: Payer: Self-pay | Admitting: Physical Therapy

## 2021-10-25 DIAGNOSIS — M6281 Muscle weakness (generalized): Secondary | ICD-10-CM

## 2021-10-25 DIAGNOSIS — M542 Cervicalgia: Secondary | ICD-10-CM

## 2021-11-01 ENCOUNTER — Ambulatory Visit: Payer: Medicaid Other | Admitting: Physical Therapy

## 2021-11-08 ENCOUNTER — Ambulatory Visit: Payer: Commercial Managed Care - HMO | Admitting: Physical Therapy

## 2021-11-13 NOTE — Therapy (Signed)
OUTPATIENT PHYSICAL THERAPY TREATMENT NOTE   Patient Name: Kathy Nguyen MRN: [id number] DOB:14-Oct-1965, 56 y.o., female Today's Date: 11/15/2021  PCP: Mateo Flow, MD   REFERRING PROVIDER: Kary Kos, MD   END OF SESSION:   PT End of Session - 11/15/21 1100     Visit Number 7    Number of Visits 11    Date for PT Re-Evaluation 12/27/21    Authorization Type [employer]    Authorization - Number of Visits 61    PT Start Time 1053    PT Stop Time 1134    PT Time Calculation (min) 41 min    Activity Tolerance Patient tolerated treatment well    Behavior During Therapy WFL for tasks assessed/performed                  Past Medical History:  Diagnosis Date   Anxiety    Arthritis    Depression    Family history of adverse reaction to anesthesia    Father had a hard time awaking   GERD (gastroesophageal reflux disease)    Headache    History of hiatal hernia    Hypertension    Pneumonia    PONV (postoperative nausea and vomiting)    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 with f/u MRI 1 yr after   BREAST SURGERY  2008   Reduction & Lift   BREAST SURGERY     Implants   TOTAL HIP ARTHROPLASTY Right 09/08/2019   Procedure: RIGHT TOTAL HIP ARTHROPLASTY ANTERIOR APPROACH;  Surgeon: Mcarthur Rossetti, MD;  Location: [facility];  Service: Orthopedics;  Laterality: Right;   VULVA / PERINEUM BIOPSY Right    Patient Active Problem List   Diagnosis Date Noted   Status post total replacement of right hip 09/08/2019   Unilateral primary osteoarthritis, right hip 08/17/2019    REFERRING DIAG: Cervicalgia  THERAPY DIAG:  Cervicalgia  Muscle weakness (generalized)  Rationale for Evaluation and Treatment Rehabilitation  PERTINENT HISTORY: Anxiety, chronic neck and back pain with headaches  PRECAUTIONS: None   SUBJECTIVE: Patient reports the past two weeks have not been  good due to aggravating her lower back pain, she does feel better now but she was not able to do anything the past few weeks. She reports that both sides of her neck have been bothering her now. She does feel like therapy and her exercises do help her feel better, but continues to have neck pain.  PAIN:  Are you having pain? Yes:  NPRS scale: 6/10 neck, 2/10 headache  Pain location: Neck (bilateral, base of skull), headaches (temporal) Pain description: Constant, aching, tightness, headaches throbbing Aggravating factors: Bending over, sleeping Relieving factors: Medication, heat / ice  PATIENT GOALS: Pain relief and reduce headaches   OBJECTIVE: (objective measures completed at initial evaluation unless otherwise dated) PATIENT SURVEYS:  NDI 39/50 disability  11/15/2021: 28/50   POSTURE: rounded shoulders and forward head    PALPATION: Tender left upper trap, cervical paraspinals, suboccipitals          CERVICAL ROM:    Active ROM A/PROM (deg) eval AROM  10/12/2021   11/15/2021  Flexion 60  60  Extension 60  60  Right lateral flexion 40  45  Left lateral flexion 35  45  Right rotation 55 65 65  Left rotation 70 70 70   UPPER EXTREMITY MMT:  MMT Right eval Left eval Rt / Lt  11/15/2021  Shoulder flexion 4 4   Shoulder extension 4 4   Shoulder abduction 4 4   Shoulder internal rotation 4 4   Shoulder external rotation 4 4   Middle trapezius _0 / 3  Lower trapezius _1 / 3    CERVICAL SPECIAL TESTS:  DNF endurance 2 seconds  10/18/2021: 9 seconds 11/15/2021: 12 seconds     TODAY'S TREATMENT:  [facility] Adult PT Treatment:                                                DATE: 11/15/2021 Therapeutic Exercise: UBE L1 x 3 min (fwd/bwd) while taking subjective Supine chin tuck with single pillow 10 x 5 sec Supine scapular protraction/retraction at 90 deg shoulder flexion with chin tuck 2 x 10 - with MHP Supine horizontal abduction with yellow - with MHP Seated upper trap  and levator scap stretch 2 x 20 sec each Seated shoulder blade squeezes 10 x 5 sec Manual: Skilled palpation and monitoring of muscle tension while performing TPDN treatment Suboccipital release with gentle manual traction STM bilateral upper traps Trigger Point Dry Needling Treatment: Pre-treatment instruction: Patient instructed on dry needling rationale, procedures, and possible side effects including pain during treatment (achy,cramping feeling), bruising, drop of blood, lightheadedness, nausea, sweating. Patient Consent Given: Yes Education handout provided: Yes Muscles treated: left upper trap Needle size and number: .30x44m x 2 Electrical stimulation performed: No Parameters: N/A Treatment response/outcome: Twitch response elicited and palpable decrease in muscle tension, patient did report feeling hot and sweaty so discontinued TPDN and had patient in supine with legs elevated until she reported feeling better Post-treatment instructions: Patient instructed to expect possible mild to moderate muscle soreness later today and/or tomorrow. Patient instructed in methods to reduce muscle soreness and to continue prescribed HEP. If patient was dry needled over the lung field, patient was instructed on signs and symptoms of pneumothorax and, however unlikely, to see immediate medical attention should they occur. Patient was also educated on signs and symptoms of infection and to seek medical attention should they occur. Patient verbalized understanding of these instructions and education.   [facility]                                                [facility]: [facility] Therapeutic Exercise: UBE L1 x 4 min (fwd/bwd) while taking subjective Supine chin tuck with single pillow 10 x 5 sec Sidelying thoracic rotation x 10 each Child's pose stretch 3 x 20 sec Supine scapular protraction/retraction at 90 deg shoulder flexion with chin tuck 2 x 10 - with MHP Supine horizontal abduction  with yellow - with MHP Seated upper trap and levator scap stretch 2 x 20 sec each Manual: Skilled palpation and monitoring of muscle tension while performing TPDN treatment Suboccipital release with gentle manual traction Trigger Point Dry Needling Treatment: Pre-treatment instruction: Patient instructed on dry needling rationale, procedures, and possible side effects including pain during treatment (achy,cramping feeling), bruising, drop of blood, lightheadedness, nausea, sweating. Patient Consent Given: Yes Education handout provided: Yes Muscles treated:bilateral suboccipitals and cervical multifidi, left temporalis Needle size and number: .30x550mx 3 and .  25x83mm x 2 Electrical stimulation performed: No Parameters: N/A Treatment response/outcome: Twitch response elicited and Palpable decrease in muscle tension Post-treatment instructions: Patient instructed to expect possible mild to moderate muscle soreness later today and/or tomorrow. Patient instructed in methods to reduce muscle soreness and to continue prescribed HEP. If patient was dry needled over the lung field, patient was instructed on signs and symptoms of pneumothorax and, however unlikely, to see immediate medical attention should they occur. Patient was also educated on signs and symptoms of infection and to seek medical attention should they occur. Patient verbalized understanding of these instructions and education.  [facility] Adult PT Treatment:                                                DATE: 10/18/2021 Therapeutic Exercise: UBE L1 x 4 min (fwd/bwd) while taking subjective Supine chin tuck with single pillow 10 x 5 sec Supine scapular protraction/retraction at 90 deg shoulder flexion with chin tuck 2 x 10 Seated cervical retraction with yellow 2 x 8 Row with green 2 x 10 Manual: Skilled palpation and monitoring of muscle tension while performing TPDN treatment Suboccipital release with gentle manual traction Trigger  Point Dry Needling Treatment: Pre-treatment instruction: Patient instructed on dry needling rationale, procedures, and possible side effects including pain during treatment (achy,cramping feeling), bruising, drop of blood, lightheadedness, nausea, sweating. Patient Consent Given: Yes Education handout provided: Yes Muscles treated: bilateral upper trap, bilateral suboccipitals  Needle size and number: .30x21mm x 3 and .25x31mm x 2 Electrical stimulation performed: No Parameters: N/A Treatment response/outcome: Twitch response elicited and Palpable decrease in muscle tension Post-treatment instructions: Patient instructed to expect possible mild to moderate muscle soreness later today and/or tomorrow. Patient instructed in methods to reduce muscle soreness and to continue prescribed HEP. If patient was dry needled over the lung field, patient was instructed on signs and symptoms of pneumothorax and, however unlikely, to see immediate medical attention should they occur. Patient was also educated on signs and symptoms of infection and to seek medical attention should they occur. Patient verbalized understanding of these instructions and education.   PATIENT EDUCATION:  Education details: POC update, progress toward goals, HEP Person educated: Patient Education method: Explanation, Demonstration, Tactile cues, Verbal cues Education comprehension: Verbalized understanding, returned demonstration, verbal cues required, tactile cues required, and needs further education   HOME EXERCISE PROGRAM: Access Code: [email or web address]     ASSESSMENT: CLINICAL IMPRESSION: Patient tolerated therapy well with no adverse effects. She has made progress in therapy, demonstrating improved cervical motion and strength, and reporting improved functional ability on NDI. She does continue to report consistent pain and tightness of her neck but reports improvement in her headache symptoms. Therapy continued to focus on reducing  cervical tension and improving postural endurance and strength. TPDN was shortened this visit due to patient reporting sympathetic response but she was able to complete rest of therapy without issues. No changes were made to HEP this visit. Patient would benefit from continued skilled PT to progress mobility and strength in order to reduce pain and maximize functional ability, so will extended PT POC for 6 more weeks at frequency of every other week..     OBJECTIVE IMPAIRMENTS decreased activity tolerance, decreased ROM, decreased strength, increased muscle spasms, postural dysfunction, and pain.    ACTIVITY LIMITATIONS bending, sitting, sleeping, and hygiene/grooming   PARTICIPATION  LIMITATIONS: meal prep, cleaning, driving, shopping, community activity, and occupation   PERSONAL FACTORS Past/current experiences and Time since onset of injury/illness/exacerbation are also affecting patient's functional outcome.      GOALS: Goals reviewed with patient? Yes   SHORT TERM GOALS: Target date: 10/19/2021    Patient will be I with initial HEP in order to progress with therapy. Baseline: HEP provided at eval 10/18/2021: patient independent with initial HEP Goal status: MET   2.  Patient will report </= 5/10 neck pain and headache in order to reduce functional limitation Baseline: 7/10 pain 10/18/2021: patient reports pain </= 4/10  Goal status: MET   3.  Patient will demonstrate DNF endurance >/= 10 seconds in order to improve sitting tolerance Baseline: DNF endurance 2 seconds 10/18/2021: DNF endurance 9 sec 11/15/2021: 12 seconds Goal status: MET   LONG TERM GOALS: Target date: 12/27/2021   Patient will be I with final HEP to maintain progress from PT. Baseline: HEP provided at eval 11/15/2021: progressing Goal status: PARTIALLY MET   2.  Patient will report </= 25/50 disability on NDI in order to indicate improved functional ability Baseline: 39/50 disability 11/15/2021: 28/50 Goal  status: PARTIALLY MET   3.  Patient will demonstrate >/= 20 sec DNF endurance and periscapular strength >/= 4/5 MMT in order to improve postural control and reduce neck pain Baseline: DNF endurance 2 sec, periscapular strength 3/5 MMT 11/15/2021: DNF endurance 12 seconds, periscapular strength 3/5 MMT Goal status: PARTIALLY MET   4.  Patient will report </= 3/10 neck pain and headaches in order to improve ability to perform social and recreational tasks Baseline: 7/10 pain 11/15/2021: 6/10 PAIN Goal status: PARTIALLY MET     PLAN: PT FREQUENCY: Biweekly   PT DURATION: 6 weeks   PLANNED INTERVENTIONS: Therapeutic exercises, Therapeutic activity, Neuromuscular re-education, Balance training, Gait training, Patient/Family education, Joint manipulation, Joint mobilization, Aquatic Therapy, Dry Needling, Electrical stimulation, Spinal manipulation, Spinal mobilization, Cryotherapy, Moist heat, Taping, Manual therapy, and Re-evaluation   PLAN FOR NEXT SESSION: Review and progress HEP PRN, dry needling/manual for cervical region, gentle cervical stretching, DNF endurance and progressive postural strengthening    Hilda Blades, PT, DPT, LAT, ATC 11/15/21  1:29 PM Phone: [phone] Fax: [phone]

## 2021-11-15 ENCOUNTER — Ambulatory Visit: Payer: Commercial Managed Care - HMO | Attending: Neurosurgery | Admitting: Physical Therapy

## 2021-11-15 ENCOUNTER — Other Ambulatory Visit: Payer: Self-pay

## 2021-11-15 ENCOUNTER — Encounter: Payer: Self-pay | Admitting: Physical Therapy

## 2021-11-15 DIAGNOSIS — M6281 Muscle weakness (generalized): Secondary | ICD-10-CM | POA: Insufficient documentation

## 2021-11-15 DIAGNOSIS — M542 Cervicalgia: Secondary | ICD-10-CM | POA: Insufficient documentation

## 2021-11-29 ENCOUNTER — Other Ambulatory Visit: Payer: Self-pay

## 2021-11-29 ENCOUNTER — Ambulatory Visit: Payer: Commercial Managed Care - HMO | Admitting: Physical Therapy

## 2021-11-29 ENCOUNTER — Encounter: Payer: Self-pay | Admitting: Physical Therapy

## 2021-11-29 DIAGNOSIS — M542 Cervicalgia: Secondary | ICD-10-CM | POA: Diagnosis not present

## 2021-11-29 DIAGNOSIS — M6281 Muscle weakness (generalized): Secondary | ICD-10-CM

## 2021-11-29 NOTE — Therapy (Addendum)
OUTPATIENT PHYSICAL THERAPY TREATMENT NOTE  DISCHARGE   Patient Name: Kathy Nguyen MRN: [id number] DOB:[birth year], 56 y.o., female Today's Date: 11/29/2021  PCP: Mateo Flow, MD   REFERRING PROVIDER: Kary Kos, MD   END OF SESSION:   PT End of Session - 11/29/21 1624     Visit Number 8    Number of Visits 11    Date for PT Re-Evaluation 12/27/21    Authorization Type [employer]    Authorization - Number of Visits 52    PT Start Time 1615    PT Stop Time 1708    PT Time Calculation (min) 53 min    Activity Tolerance Patient tolerated treatment well    Behavior During Therapy WFL for tasks assessed/performed                   Past Medical History:  Diagnosis Date   Anxiety    Arthritis    Depression    Family history of adverse reaction to anesthesia    Father had a hard time awaking   GERD (gastroesophageal reflux disease)    Headache    History of hiatal hernia    Hypertension    Pneumonia    PONV (postoperative nausea and vomiting)    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 with f/u MRI 1 yr after   BREAST SURGERY  2008   Reduction & Lift   BREAST SURGERY     Implants   TOTAL HIP ARTHROPLASTY Right 09/08/2019   Procedure: RIGHT TOTAL HIP ARTHROPLASTY ANTERIOR APPROACH;  Surgeon: Mcarthur Rossetti, MD;  Location: [place];  Service: Orthopedics;  Laterality: Right;   VULVA / PERINEUM BIOPSY Right    Patient Active Problem List   Diagnosis Date Noted   Status post total replacement of right hip 09/08/2019   Unilateral primary osteoarthritis, right hip 08/17/2019    REFERRING DIAG: Cervicalgia  THERAPY DIAG:  Cervicalgia  Muscle weakness (generalized)  Rationale for Evaluation and Treatment Rehabilitation  PERTINENT HISTORY: Anxiety, chronic neck and back pain with headaches  PRECAUTIONS: None   SUBJECTIVE: Patient reports persistent neck and  lower back pain. She has tried all the exercises and stretches but the pain continues to come back. She feels she is still the same. She also has noticed more weakness of her left arm.  PAIN:  Are you having pain? Yes:  NPRS scale: 7/10 neck, 2/10 headache  Pain location: Neck (bilateral, base of skull), headaches (temporal) Pain description: Constant, aching, tightness, headaches throbbing Aggravating factors: Bending over, sleeping Relieving factors: Medication, heat / ice  PATIENT GOALS: Pain relief and reduce headaches   OBJECTIVE: (objective measures completed at initial evaluation unless otherwise dated) PATIENT SURVEYS:  NDI 39/50 disability  11/15/2021: 28/50   POSTURE: rounded shoulders and forward head    PALPATION: Tender left upper trap, cervical paraspinals, suboccipitals          CERVICAL ROM:    Active ROM A/PROM (deg) eval AROM  10/12/2021   11/15/2021  Flexion 60  60  Extension 60  60  Right lateral flexion 40  45  Left lateral flexion 35  45  Right rotation 55 65 65  Left rotation 70 70 70   UPPER EXTREMITY MMT:   MMT Right eval Left eval Rt / Lt  11/15/2021  Shoulder flexion 4 4   Shoulder extension 4 4  Shoulder abduction 4 4   Shoulder internal rotation 4 4   Shoulder external rotation 4 4   Middle trapezius _0 / 3  Lower trapezius _1 / 3    CERVICAL SPECIAL TESTS:  DNF endurance 2 seconds  10/18/2021: 9 seconds 11/15/2021: 12 seconds     TODAY'S TREATMENT:  [facility] Adult PT Treatment:                                                DATE: 11/29/2021 Therapeutic Exercise: UBE L1 x 4 min (fwd/bwd) following manual to encourage movement and muscle activation Seated upper trap and levator scap stretch 2 x 20 sec each Seated shoulder blade squeezes 10 x 5 sec Seated chin tuck 10 x 5 sec Manual: Skilled palpation and monitoring of muscle tension while performing TPDN treatment Suboccipital release with gentle manual traction Cervical PROM STM  left upper traps Trigger Point Dry Needling Treatment: Pre-treatment instruction: Patient instructed on dry needling rationale, procedures, and possible side effects including pain during treatment (achy,cramping feeling), bruising, drop of blood, lightheadedness, nausea, sweating. Patient Consent Given: Yes Education handout provided: Yes Muscles treated: left upper trap and suboccipital Needle size and number: .30x96m x 4 Electrical stimulation performed: No Parameters: N/A Treatment response/outcome: Twitch response elicited and palpable decrease in muscle tension Post-treatment instructions: Patient instructed to expect possible mild to moderate muscle soreness later today and/or tomorrow. Patient instructed in methods to reduce muscle soreness and to continue prescribed HEP. If patient was dry needled over the lung field, patient was instructed on signs and symptoms of pneumothorax and, however unlikely, to see immediate medical attention should they occur. Patient was also educated on signs and symptoms of infection and to seek medical attention should they occur. Patient verbalized understanding of these instructions and education. Modalities: Electrical Stimulation and MHP Location: Left upper trap Action: Premod Parameters: 80-150, Continuous, 10 min, intensity to patient tolerance Goals: Pain relief and Reduced muscle tension   [facility] Adult PT Treatment:                                                DATE: 11/15/2021 Therapeutic Exercise: UBE L1 x 3 min (fwd/bwd) while taking subjective Supine chin tuck with single pillow 10 x 5 sec Supine scapular protraction/retraction at 90 deg shoulder flexion with chin tuck 2 x 10 - with MHP Supine horizontal abduction with yellow - with MHP Seated upper trap and levator scap stretch 2 x 20 sec each Seated shoulder blade squeezes 10 x 5 sec Manual: Skilled palpation and monitoring of muscle tension while performing TPDN treatment Suboccipital  release with gentle manual traction STM bilateral upper traps Trigger Point Dry Needling Treatment: Pre-treatment instruction: Patient instructed on dry needling rationale, procedures, and possible side effects including pain during treatment (achy,cramping feeling), bruising, drop of blood, lightheadedness, nausea, sweating. Patient Consent Given: Yes Education handout provided: Yes Muscles treated: left upper trap Needle size and number: .30x531mx 2 Electrical stimulation performed: No Parameters: N/A Treatment response/outcome: Twitch response elicited and palpable decrease in muscle tension, patient did report feeling hot and sweaty so discontinued TPDN and had patient in supine with legs elevated until she reported feeling better Post-treatment instructions: Patient  instructed to expect possible mild to moderate muscle soreness later today and/or tomorrow. Patient instructed in methods to reduce muscle soreness and to continue prescribed HEP. If patient was dry needled over the lung field, patient was instructed on signs and symptoms of pneumothorax and, however unlikely, to see immediate medical attention should they occur. Patient was also educated on signs and symptoms of infection and to seek medical attention should they occur. Patient verbalized understanding of these instructions and education.  [facility] Adult PT Treatment:                                                DATE: 10/25/2021 Therapeutic Exercise: UBE L1 x 4 min (fwd/bwd) while taking subjective Supine chin tuck with single pillow 10 x 5 sec Sidelying thoracic rotation x 10 each Child's pose stretch 3 x 20 sec Supine scapular protraction/retraction at 90 deg shoulder flexion with chin tuck 2 x 10 - with MHP Supine horizontal abduction with yellow - with MHP Seated upper trap and levator scap stretch 2 x 20 sec each Manual: Skilled palpation and monitoring of muscle tension while performing TPDN treatment Suboccipital  release with gentle manual traction Trigger Point Dry Needling Treatment: Pre-treatment instruction: Patient instructed on dry needling rationale, procedures, and possible side effects including pain during treatment (achy,cramping feeling), bruising, drop of blood, lightheadedness, nausea, sweating. Patient Consent Given: Yes Education handout provided: Yes Muscles treated:bilateral suboccipitals and cervical multifidi, left temporalis Needle size and number: .30x108m x 3 and .25x345mx 2 Electrical stimulation performed: No Parameters: N/A Treatment response/outcome: Twitch response elicited and Palpable decrease in muscle tension Post-treatment instructions: Patient instructed to expect possible mild to moderate muscle soreness later today and/or tomorrow. Patient instructed in methods to reduce muscle soreness and to continue prescribed HEP. If patient was dry needled over the lung field, patient was instructed on signs and symptoms of pneumothorax and, however unlikely, to see immediate medical attention should they occur. Patient was also educated on signs and symptoms of infection and to seek medical attention should they occur. Patient verbalized understanding of these instructions and education.   PATIENT EDUCATION:  Education details: HEP Person educated: Patient Education method: ExEducation officer, environmentalTaCorporate treasurerues, Verbal cues Education comprehension: Verbalized understanding, returned demonstration, verbal cues required, tactile cues required, and needs further education   HOME EXERCISE PROGRAM: Access Code: [email or web address]     ASSESSMENT: CLINICAL IMPRESSION: Patient with fair tolerance for therapy, no adverse effects reported. She reports continued pain and tightness of the left side of her neck so continued with TPDN as this provided her relief, and used e-stim combined with MHP post session for pain relief and reducing muscle tension. She was able to tolerate light stretching  and postural control exercises following manual and TPDN. No changes to HEP this visit. Patient would benefit from continued skilled PT to progress mobility and strength in order to reduce pain and maximize functional ability.     OBJECTIVE IMPAIRMENTS decreased activity tolerance, decreased ROM, decreased strength, increased muscle spasms, postural dysfunction, and pain.    ACTIVITY LIMITATIONS bending, sitting, sleeping, and hygiene/grooming   PARTICIPATION LIMITATIONS: meal prep, cleaning, driving, shopping, community activity, and occupation   PERSONAL FACTORS Past/current experiences and Time since onset of injury/illness/exacerbation are also affecting patient's functional outcome.      GOALS: Goals reviewed with patient? Yes   SHORT TERM  GOALS: Target date: 10/19/2021    Patient will be I with initial HEP in order to progress with therapy. Baseline: HEP provided at eval 10/18/2021: patient independent with initial HEP Goal status: MET   2.  Patient will report </= 5/10 neck pain and headache in order to reduce functional limitation Baseline: 7/10 pain 10/18/2021: patient reports pain </= 4/10  Goal status: MET   3.  Patient will demonstrate DNF endurance >/= 10 seconds in order to improve sitting tolerance Baseline: DNF endurance 2 seconds 10/18/2021: DNF endurance 9 sec 11/15/2021: 12 seconds Goal status: MET   LONG TERM GOALS: Target date: 12/27/2021   Patient will be I with final HEP to maintain progress from PT. Baseline: HEP provided at eval 11/15/2021: progressing Goal status: PARTIALLY MET   2.  Patient will report </= 25/50 disability on NDI in order to indicate improved functional ability Baseline: 39/50 disability 11/15/2021: 28/50 Goal status: PARTIALLY MET   3.  Patient will demonstrate >/= 20 sec DNF endurance and periscapular strength >/= 4/5 MMT in order to improve postural control and reduce neck pain Baseline: DNF endurance 2 sec, periscapular strength 3/5  MMT 11/15/2021: DNF endurance 12 seconds, periscapular strength 3/5 MMT Goal status: PARTIALLY MET   4.  Patient will report </= 3/10 neck pain and headaches in order to improve ability to perform social and recreational tasks Baseline: 7/10 pain 11/15/2021: 6/10 PAIN Goal status: PARTIALLY MET     PLAN: PT FREQUENCY: Biweekly   PT DURATION: 6 weeks   PLANNED INTERVENTIONS: Therapeutic exercises, Therapeutic activity, Neuromuscular re-education, Balance training, Gait training, Patient/Family education, Joint manipulation, Joint mobilization, Aquatic Therapy, Dry Needling, Electrical stimulation, Spinal manipulation, Spinal mobilization, Cryotherapy, Moist heat, Taping, Manual therapy, and Re-evaluation   PLAN FOR NEXT SESSION: Review and progress HEP PRN, dry needling/manual for cervical region, gentle cervical stretching, DNF endurance and progressive postural strengthening    Hilda Blades, PT, DPT, LAT, ATC 11/29/21  5:03 PM Phone: [phone] Fax: [phone]     PHYSICAL THERAPY DISCHARGE SUMMARY  Visits from Start of Care: 8  Current functional level related to goals / functional outcomes: See above   Remaining deficits: See above   Education / Equipment: HEP   Patient agrees to discharge. Patient goals were not met. Patient is being discharged due to not returning since the last visit.  Hilda Blades, PT, DPT, LAT, ATC 03/09/22  9:35 AM Phone: [phone] Fax: [phone]

## 2021-12-13 ENCOUNTER — Ambulatory Visit: Payer: Commercial Managed Care - HMO | Admitting: Physical Therapy

## 2021-12-14 ENCOUNTER — Telehealth: Payer: Self-pay | Admitting: Physician Assistant

## 2021-12-14 ENCOUNTER — Other Ambulatory Visit: Payer: Self-pay

## 2021-12-14 MED ORDER — CLINDAMYCIN HCL 150 MG PO CAPS: ORAL_CAPSULE | ORAL | 0 refills | Status: DC

## 2021-12-14 NOTE — Telephone Encounter (Signed)
Called into pharmacy, patient aware 

## 2021-12-14 NOTE — Telephone Encounter (Signed)
Pt called requesting a refill of antibiotics for upcoming dental appt. Please send to pharmacy on file. Pt phone number is [phone].

## 2021-12-27 ENCOUNTER — Ambulatory Visit: Payer: Commercial Managed Care - HMO | Admitting: Physical Therapy

## 2021-12-28 ENCOUNTER — Ambulatory Visit: Payer: Commercial Managed Care - HMO | Admitting: Physical Therapy

## 2022-01-23 ENCOUNTER — Telehealth: Payer: Self-pay | Admitting: Physical Medicine and Rehabilitation

## 2022-01-23 NOTE — Telephone Encounter (Signed)
I left voicemail for patient requesting return call. What type of injection is she requesting? If it is to repeat the hip injection done in July, how much relief did she get?

## 2022-01-23 NOTE — Telephone Encounter (Signed)
Patient wants to know if she can have an injection. [phone]

## 2022-01-24 NOTE — Telephone Encounter (Signed)
I spoke with patient. She states that it is time for the repeat injection for her lumbar spine. She is status post lumbar ESI 08/22/2021 with at least 70% relief. She is concerned, however, because she has had a lumbar ESI 08/22/2021, a cervical facet injection at [facility] on 09/07/2021, a left hip injection 10/20/2021, and now trigger point injections around the neck on 01/22/2022.  She had seen her PCP over the summer who told her she had gotten too many injections and there was too much in her system.    Patient would like to know if you feel that she needs to hold off on lumbar injection and for how long? Can either of you advise? Would you like me to enter referral or hold off?

## 2022-01-25 NOTE — Telephone Encounter (Signed)
I left voicemail advising. ?

## 2022-02-21 ENCOUNTER — Telehealth: Payer: Self-pay | Admitting: Physical Medicine and Rehabilitation

## 2022-02-21 NOTE — Telephone Encounter (Signed)
Pt called requesting an appt for back injection. Pt wants to know has enough time passed for injection in her back. Please call pt about this matter at [phone].

## 2022-03-05 ENCOUNTER — Other Ambulatory Visit: Payer: Self-pay | Admitting: Orthopaedic Surgery

## 2022-03-05 ENCOUNTER — Encounter: Payer: Self-pay | Admitting: Orthopaedic Surgery

## 2022-03-05 DIAGNOSIS — M50321 Other cervical disc degeneration at C4-C5 level: Secondary | ICD-10-CM

## 2022-03-13 ENCOUNTER — Ambulatory Visit
Admission: RE | Admit: 2022-03-13 | Discharge: 2022-03-13 | Disposition: A | Discharge status: Home / Self Care | Payer: Commercial Managed Care - HMO | Source: Ambulatory Visit | Attending: Orthopaedic Surgery | Admitting: Orthopaedic Surgery

## 2022-03-13 DIAGNOSIS — M50321 Other cervical disc degeneration at C4-C5 level: Secondary | ICD-10-CM

## 2022-03-27 ENCOUNTER — Telehealth: Payer: Self-pay | Admitting: Physical Medicine and Rehabilitation

## 2022-03-27 NOTE — Telephone Encounter (Signed)
Patient needs to sch a back injection [phone]

## 2022-03-28 ENCOUNTER — Ambulatory Visit: Payer: Commercial Managed Care - HMO | Admitting: Physical Medicine and Rehabilitation

## 2022-03-28 ENCOUNTER — Encounter: Payer: Self-pay | Admitting: Physical Medicine and Rehabilitation

## 2022-03-28 DIAGNOSIS — M4726 Other spondylosis with radiculopathy, lumbar region: Secondary | ICD-10-CM | POA: Diagnosis not present

## 2022-03-28 DIAGNOSIS — M5116 Intervertebral disc disorders with radiculopathy, lumbar region: Secondary | ICD-10-CM | POA: Diagnosis not present

## 2022-03-28 DIAGNOSIS — M5416 Radiculopathy, lumbar region: Secondary | ICD-10-CM

## 2022-03-28 NOTE — Progress Notes (Signed)
Functional Pain Scale - descriptive words and definitions   Severe (9)  Cannot do any ADL's even with assistance can barely talk/unable to sleep and unable to use distraction. Severe range order  Average Pain 9  Low back pain on right that radiates to the pelvis and buttocks down the side of the leg. Tylenol 650 mg, Gabapentin, Methocarbamol, Xanax today for pain

## 2022-03-28 NOTE — Telephone Encounter (Signed)
Patient stated she is having back pain and she can hardly function. Scheduled appointment for 03/28/22

## 2022-03-28 NOTE — Progress Notes (Signed)
YAMILA GERSCH - 56 y.o. female MRN [id number]  Date of birth: 1965/12/31  Office Visit Note: Visit Date: 03/28/2022 PCP: Mateo Flow, MD Referred by: Mateo Flow, MD  Subjective: Chief Complaint  Patient presents with   Lower Back - Pain   HPI: SAMREET HEGGIE is a 56 y.o. female who comes in today for evaluation of chronic, worsening and severe right sided lower back pain radiating to buttock, hip and down posterolateral leg to foot. Pain ongoing for many years and worsens with movement and activity, she describes pain as sharp, sore and aching, currently rates as 10 out of 10. Patient states she recently traveled home from [place] and feels this long plane ride exacerbated her chronic back pain. Some relief of pain with home exercise regimen, rest and use of medications. Currently taking Gabapentin, Robaxin and Tylenol. Also using Lidocaine patches and topical pain creams. Lumbar MRI imaging from 2022 exhibits broad based central disc protrusion slightly eccentric to the right at L5-S1. No high grade spinal canal stenosis noted. Historically, patient has done well with intermittent S1 transforaminal epidural steroid injections over the years. States her pain has significantly increased over the last several weeks, would like to have injection today. Patient denies focal weakness, numbness and tingling. Patient denies recent trauma or falls.   Patient has been followed by Dr. Elmer Picker at [facility] for many years, per patient he did recommend lumbar discectomy and fusion several years ago, patient declined surgical intervention at that time. More recently she has seen Dr. Saintclair Halsted for chronic neck pain. She also recently consulted with Dr. Rennis Harding at [facility] for evaluation of chronic neck issues, did receive cervical trigger point injections with minimal relief of pain. Dr. Patrice Paradise did place referral to rheumatology, per his note he feels there could be a  systemic issue contributing to her symptoms.       Review of Systems  Musculoskeletal:  Positive for back pain.  Neurological:  Negative for tingling, sensory change, focal weakness and weakness.  All other systems reviewed and are negative.  Otherwise per HPI.  Assessment & Plan: Visit Diagnoses:    ICD-10-CM   1. Lumbar radiculopathy  M54.16 Ambulatory referral to Physical Medicine Rehab    Toradol Injection    2. Radiculopathy due to lumbar intervertebral disc disorder  M51.16 Ambulatory referral to Physical Medicine Rehab    Toradol Injection    3. Other spondylosis with radiculopathy, lumbar region  M47.26 Ambulatory referral to Physical Medicine Rehab       Plan: Findings:  Chronic, worsening and severe right-sided lower back pain radiating to buttock, hip and down posterolateral leg to foot.  Patient continues to have severe pain despite good conservative therapies such as home exercise regimen, rest and use of medications.  Patient's clinical presentation and exam are consistent with S1 nerve pattern.  There is broad based central disc protrusion slightly to the right at the level of L5-S1.  Significant and sustained relief of pain with previous lumbar epidural steroid injections.  Next step is to perform diagnostic and hopefully therapeutic right S1 transforaminal epidural steroid injection under fluoroscopic guidance.  If good relief of pain with this injection we can repeat this procedure infrequently as needed.  I did speak with patient about medication management and completed intramuscular injection of Toradol in the office today. She tolerated without difficulty. Patient did voice concerns that her pain is severe and she would like to  get in quickly for injection.  I did offer for patient to have injection at [facility] as this may be quicker than we can get her on her schedule, however patient declined and would like to proceed with injection in our office.  No red  flag symptoms noted upon exam today.  She will continue to follow up with Dr. Wynetta Emery and or Dr. Noel Gerold for her cervical spine.    Meds & Orders: No orders of the defined types were placed in this encounter.   Orders Placed This Encounter  Procedures   Toradol Injection   Ambulatory referral to Physical Medicine Rehab    Follow-up: Return for Right S1 transforaminal epidural steroid injection.   Procedures: Toradol Injection on 03/29/2022 9:18 AM Indications: pain Details: 25 G needle, posterior approach Medications: 60 mg ketorolac 30 MG/ML Outcome: tolerated well, no immediate complications  Site: Right Ventrogluteal Consent was given by the patient. Immediately prior to procedure a time out was called to verify the correct patient, procedure, equipment, support staff and site/side marked as required. Patient was prepped and draped in the usual sterile fashion.          Clinical History: EXAM: MRI LUMBAR SPINE WITHOUT CONTRAST   TECHNIQUE: Multiplanar, multisequence MR imaging of the lumbar spine was performed. No intravenous contrast was administered.   COMPARISON:  Lumbar spine MRI 01/19/2020. lumbar spine radiographs 12/07/2019.   FINDINGS: Segmentation: 5 lumbar vertebrae. The caudal most well-formed intervertebral disc space is designated L5-S1.   Alignment: Straightening of the expected lumbar lordosis. No significant spondylolisthesis.   Vertebrae: Multilevel small Schmorl nodes. Vertebral body height is otherwise maintained. Trace degenerative edema within the posterior elements on the left at L5. Elsewhere, no significant marrow edema or focal suspicious osseous lesion is identified.   Conus medullaris and cauda equina: Conus extends to the L1 level. No signal abnormality within the visualized distal spinal cord.   Paraspinal and other soft tissues: Small right renal cyst. Paraspinal soft tissues within normal limits.   Disc levels:   Unless otherwise  stated, the level by level findings below have not significantly changed since prior MRI 01/19/2020.   Moderate disc degeneration at L5-S1. No more than mild disc degeneration at the remaining levels.   T12-L1: No significant disc herniation or stenosis. Perineural cyst within the right neural foramen.   L1-L2: Disc bulge with endplate spurring. Mild facet arthrosis. No significant spinal canal or foraminal stenosis.   L2-L3: Minimal facet arthrosis. No significant disc herniation or stenosis.   L3-L4: Disc bulge. Mild facet arthrosis/ligamentum flavum hypertrophy. No significant spinal canal or foraminal stenosis.   L4-L5: Disc bulge. Superimposed tiny left center disc protrusion (series 6, image 32). Mild facet arthrosis. No significant spinal canal or foraminal stenosis.   L5-S1: Disc bulge with endplate spurring. Superimposed broad-based central disc protrusion slightly eccentric to the right. As before, the disc extrusion likely contacts the bilateral descending S1 nerve roots (series 6, image 38). Central canal patent. Mild relative bilateral neural foraminal narrowing.   IMPRESSION: Lumbar spondylosis as outlined and having not significantly changed from the lumbar spine MRI of 01/19/2020. Findings are most notably as follows.   At L5-S1, there is moderate disc degeneration. Disc bulge with endplate spurring. Superimposed broad-based central disc protrusion slightly eccentric to the right. As before, the disc extrusion likely contacts the bilateral descending S1 nerve roots. Correlate for bilateral S1 radiculopathy. Mild relative bilateral neural foraminal narrowing.   No significant spinal canal or foraminal stenosis at  the remaining levels.     Electronically Signed   By: Jackey LogeKyle  Golden DO   On: 08/15/2020 08:34   She reports that she has never smoked. She has never used smokeless tobacco. No results for input(s): "HGBA1C", "LABURIC" in the last 8760  hours.  Objective:  VS:  HT:    WT:   BMI:     BP:   HR: bpm  TEMP: ( )  RESP:  Physical Exam Vitals and nursing note reviewed.  HENT:     Head: Normocephalic and atraumatic.     Right Ear: External ear normal.     Left Ear: External ear normal.     Nose: Nose normal.     Mouth/Throat:     Mouth: Mucous membranes are moist.  Eyes:     Extraocular Movements: Extraocular movements intact.  Cardiovascular:     Rate and Rhythm: Normal rate.     Pulses: Normal pulses.  Pulmonary:     Effort: Pulmonary effort is normal.  Abdominal:     General: Abdomen is flat. There is no distension.  Musculoskeletal:        General: Tenderness present.     Cervical back: Normal range of motion.     Comments: Pt rises from seated position to standing without difficulty. Good lumbar range of motion. Strong distal strength without clonus, no pain upon palpation of greater trochanters. Sensation intact bilaterally. Dysesthesias noted to right S1 dermatome. Walks independently, gait steady.   Skin:    General: Skin is warm and dry.     Capillary Refill: Capillary refill takes less than 2 seconds.  Neurological:     General: No focal deficit present.     Mental Status: She is alert and oriented to person, place, and time.  Psychiatric:        Mood and Affect: Mood normal.        Behavior: Behavior normal.     Ortho Exam  Imaging: No results found.  Past Medical/Family/Surgical/Social History: Medications & Allergies reviewed per EMR, new medications updated. Patient Active Problem List   Diagnosis Date Noted   Status post total replacement of right hip 09/08/2019   Unilateral primary osteoarthritis, right hip 08/17/2019   Past Medical History:  Diagnosis Date   Anxiety    Arthritis    Depression    Family history of adverse reaction to anesthesia    Father had a hard time awaking   GERD (gastroesophageal reflux disease)    Headache    History of hiatal hernia    Hypertension     Pneumonia    PONV (postoperative nausea and vomiting)    History reviewed. No pertinent family history. 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 with f/u MRI 1 yr after   BREAST SURGERY  2008   Reduction & Lift   BREAST SURGERY     Implants   TOTAL HIP ARTHROPLASTY Right 09/08/2019   Procedure: RIGHT TOTAL HIP ARTHROPLASTY ANTERIOR APPROACH;  Surgeon: Kathryne HitchBlackman, Christopher Y, MD;  Location: [facility] OR;  Service: Orthopedics;  Laterality: Right;   VULVA / PERINEUM BIOPSY Right    Social History   Occupational History   Not on file  Tobacco Use   Smoking status: Never   Smokeless tobacco: Never  Vaping Use   Vaping Use: Never used  Substance and Sexual Activity   Alcohol use: Not Currently  Drug use: Never   Sexual activity: Not on file

## 2022-03-29 DIAGNOSIS — M5416 Radiculopathy, lumbar region: Secondary | ICD-10-CM

## 2022-03-29 DIAGNOSIS — M5116 Intervertebral disc disorders with radiculopathy, lumbar region: Secondary | ICD-10-CM | POA: Diagnosis not present

## 2022-03-30 MED ORDER — KETOROLAC TROMETHAMINE 30 MG/ML IJ SOLN
60.0000 mg | INTRAMUSCULAR | Status: AC | PRN
  Administered 2022-03-29: 60 mg via INTRAMUSCULAR

## 2022-04-04 ENCOUNTER — Telehealth: Payer: Self-pay | Admitting: Physical Medicine and Rehabilitation

## 2022-04-04 NOTE — Telephone Encounter (Signed)
Pt returned call to [place] to sent an appt with Dr Alvester Morin. Pt phone number is [phone].

## 2022-04-13 ENCOUNTER — Other Ambulatory Visit: Payer: Self-pay | Admitting: Physician Assistant

## 2022-04-16 ENCOUNTER — Ambulatory Visit: Payer: Commercial Managed Care - HMO | Admitting: Physical Medicine and Rehabilitation

## 2022-04-16 ENCOUNTER — Ambulatory Visit: Payer: Self-pay

## 2022-04-16 VITALS — BP 158/92 | HR 94

## 2022-04-16 DIAGNOSIS — M5416 Radiculopathy, lumbar region: Secondary | ICD-10-CM | POA: Diagnosis not present

## 2022-04-16 MED ORDER — METHYLPREDNISOLONE ACETATE 80 MG/ML IJ SUSP: 80.0000 mg | Freq: Once | INTRAMUSCULAR | Status: DC

## 2022-04-16 NOTE — Progress Notes (Signed)
Functional Pain Scale - descriptive words and definitions  Intense (8)    Cannot complete any ADLs without much assistance/cannot concentrate/conversation is difficult/unable to sleep and unable to use distraction. Severe range order  Average Pain 8   +Driver, -BT, -Dye Allergies.  Right lower back pain that is radiating to the left side now. Right and left leg pain to the foot

## 2022-04-16 NOTE — Patient Instructions (Signed)

## 2022-04-28 NOTE — Progress Notes (Signed)
Kathy Nguyen - 57 y.o. female MRN [id number]  Date of birth: January 15, 1966  Office Visit Note: Visit Date: 04/16/2022 PCP: Lise Auer, MD Referred by: Lise Auer, MD  Subjective: Chief Complaint  Patient presents with   Lower Back - Pain   HPI:  Kathy Nguyen is a 57 y.o. female who comes in today at the request of Ellin Goodie, FNP for planned Right S1-2 Lumbar Transforaminal epidural steroid injection with fluoroscopic guidance.  The patient has failed conservative care including home exercise, medications, time and activity modification.  This injection will be diagnostic and hopefully therapeutic.  Please see requesting physician notes for further details and justification.   ROS Otherwise per HPI.  Assessment & Plan: Visit Diagnoses:    ICD-10-CM   1. Lumbar radiculopathy  M54.16 XR C-ARM NO REPORT    Epidural Steroid injection    methylPREDNISolone acetate (DEPO-MEDROL) injection 80 mg      Plan: No additional findings.   Meds & Orders:  Meds ordered this encounter  Medications   methylPREDNISolone acetate (DEPO-MEDROL) injection 80 mg    Orders Placed This Encounter  Procedures   XR C-ARM NO REPORT   Epidural Steroid injection    Follow-up: Return for visit to requesting provider as needed.   Procedures: No procedures performed  S1 Lumbosacral Transforaminal Epidural Steroid Injection - Sub-Pedicular Approach with Fluoroscopic Guidance   Patient: Kathy Nguyen      Date of Birth: May 29, 1965 MRN: [id number] PCP: Lise Auer, MD      Visit Date: 04/16/2022   Universal Protocol:    Date/Time: 01/20/245:46 PM  Consent Given By: the patient  Position:  PRONE  Additional Comments: Vital signs were monitored before and after the procedure. Patient was prepped and draped in the usual sterile fashion. The correct patient, procedure, and site was verified.   Injection Procedure Details:  Procedure Site One Meds Administered:  Meds ordered this  encounter  Medications   methylPREDNISolone acetate (DEPO-MEDROL) injection 80 mg    Laterality: Right  Location/Site:  S1 Foramen   Needle size: 22 ga.  Needle type: Spinal  Needle Placement: Transforaminal  Findings:   -Comments: Excellent flow of contrast along the nerve, nerve root and into the epidural space.  Epidurogram: Contrast epidurogram showed no nerve root cut off or restricted flow pattern.  Procedure Details: After squaring off the sacral end-plate to get a true AP view, the C-arm was positioned so that the best possible view of the S1 foramen was visualized. The soft tissues overlying this structure were infiltrated with 2-3 ml. of 1% Lidocaine without Epinephrine.    The spinal needle was inserted toward the target using a "trajectory" view along the fluoroscope beam.  Under AP and lateral visualization, the needle was advanced so it did not puncture dura. Biplanar projections were used to confirm position. Aspiration was confirmed to be negative for CSF and/or blood. A 1-2 ml. volume of Isovue-250 was injected and flow of contrast was noted at each level. Radiographs were obtained for documentation purposes.   After attaining the desired flow of contrast documented above, a 0.5 to 1.0 ml test dose of 0.25% Marcaine was injected into each respective transforaminal space.  The patient was observed for 90 seconds post injection.  After no sensory deficits were reported, and normal lower extremity motor function was noted,   the above injectate was administered so that equal amounts of the injectate were placed at each foramen (level) into the  transforaminal epidural space.   Additional Comments:  The patient tolerated the procedure well Dressing: Band-Aid with 2 x 2 sterile gauze    Post-procedure details: Patient was observed during the procedure. Post-procedure instructions were reviewed.  Patient left the clinic in stable condition.   Clinical  History: EXAM: MRI LUMBAR SPINE WITHOUT CONTRAST   TECHNIQUE: Multiplanar, multisequence MR imaging of the lumbar spine was performed. No intravenous contrast was administered.   COMPARISON:  Lumbar spine MRI 01/19/2020. lumbar spine radiographs 12/07/2019.   FINDINGS: Segmentation: 5 lumbar vertebrae. The caudal most well-formed intervertebral disc space is designated L5-S1.   Alignment: Straightening of the expected lumbar lordosis. No significant spondylolisthesis.   Vertebrae: Multilevel small Schmorl nodes. Vertebral body height is otherwise maintained. Trace degenerative edema within the posterior elements on the left at L5. Elsewhere, no significant marrow edema or focal suspicious osseous lesion is identified.   Conus medullaris and cauda equina: Conus extends to the L1 level. No signal abnormality within the visualized distal spinal cord.   Paraspinal and other soft tissues: Small right renal cyst. Paraspinal soft tissues within normal limits.   Disc levels:   Unless otherwise stated, the level by level findings below have not significantly changed since prior MRI 01/19/2020.   Moderate disc degeneration at L5-S1. No more than mild disc degeneration at the remaining levels.   T12-L1: No significant disc herniation or stenosis. Perineural cyst within the right neural foramen.   L1-L2: Disc bulge with endplate spurring. Mild facet arthrosis. No significant spinal canal or foraminal stenosis.   L2-L3: Minimal facet arthrosis. No significant disc herniation or stenosis.   L3-L4: Disc bulge. Mild facet arthrosis/ligamentum flavum hypertrophy. No significant spinal canal or foraminal stenosis.   L4-L5: Disc bulge. Superimposed tiny left center disc protrusion (series 6, image 32). Mild facet arthrosis. No significant spinal canal or foraminal stenosis.   L5-S1: Disc bulge with endplate spurring. Superimposed broad-based central disc protrusion slightly  eccentric to the right. As before, the disc extrusion likely contacts the bilateral descending S1 nerve roots (series 6, image 38). Central canal patent. Mild relative bilateral neural foraminal narrowing.   IMPRESSION: Lumbar spondylosis as outlined and having not significantly changed from the lumbar spine MRI of 01/19/2020. Findings are most notably as follows.   At L5-S1, there is moderate disc degeneration. Disc bulge with endplate spurring. Superimposed broad-based central disc protrusion slightly eccentric to the right. As before, the disc extrusion likely contacts the bilateral descending S1 nerve roots. Correlate for bilateral S1 radiculopathy. Mild relative bilateral neural foraminal narrowing.   No significant spinal canal or foraminal stenosis at the remaining levels.     Electronically Signed   By: Kellie Simmering DO   On: 08/15/2020 08:34     Objective:  VS:  HT:    WT:   BMI:     BP:(!) 158/92  HR:94bpm  TEMP: ( )  RESP:  Physical Exam Vitals and nursing note reviewed.  Constitutional:      General: She is not in acute distress.    Appearance: Normal appearance. She is not ill-appearing.  HENT:     Head: Normocephalic and atraumatic.     Right Ear: External ear normal.     Left Ear: External ear normal.  Eyes:     Extraocular Movements: Extraocular movements intact.  Cardiovascular:     Rate and Rhythm: Normal rate.     Pulses: Normal pulses.  Pulmonary:     Effort: Pulmonary effort is normal. No respiratory  distress.  Abdominal:     General: There is no distension.     Palpations: Abdomen is soft.  Musculoskeletal:        General: Tenderness present.     Cervical back: Neck supple.     Right lower leg: No edema.     Left lower leg: No edema.     Comments: Patient has good distal strength with no pain over the greater trochanters.  No clonus or focal weakness.  Skin:    Findings: No erythema, lesion or rash.  Neurological:     General: No  focal deficit present.     Mental Status: She is alert and oriented to person, place, and time.     Sensory: No sensory deficit.     Motor: No weakness or abnormal muscle tone.     Coordination: Coordination normal.  Psychiatric:        Mood and Affect: Mood normal.        Behavior: Behavior normal.      Imaging: No results found.

## 2022-04-28 NOTE — Procedures (Signed)
S1 Lumbosacral Transforaminal Epidural Steroid Injection - Sub-Pedicular Approach with Fluoroscopic Guidance   Patient: Kathy Nguyen      Date of Birth: 09/27/1965 MRN: [id number] PCP: Mateo Flow, MD      Visit Date: 04/16/2022   Universal Protocol:    Date/Time: 01/20/245:46 PM  Consent Given By: the patient  Position:  PRONE  Additional Comments: Vital signs were monitored before and after the procedure. Patient was prepped and draped in the usual sterile fashion. The correct patient, procedure, and site was verified.   Injection Procedure Details:  Procedure Site One Meds Administered:  Meds ordered this encounter  Medications   methylPREDNISolone acetate (DEPO-MEDROL) injection 80 mg    Laterality: Right  Location/Site:  S1 Foramen   Needle size: 22 ga.  Needle type: Spinal  Needle Placement: Transforaminal  Findings:   -Comments: Excellent flow of contrast along the nerve, nerve root and into the epidural space.  Epidurogram: Contrast epidurogram showed no nerve root cut off or restricted flow pattern.  Procedure Details: After squaring off the sacral end-plate to get a true AP view, the C-arm was positioned so that the best possible view of the S1 foramen was visualized. The soft tissues overlying this structure were infiltrated with 2-3 ml. of 1% Lidocaine without Epinephrine.    The spinal needle was inserted toward the target using a "trajectory" view along the fluoroscope beam.  Under AP and lateral visualization, the needle was advanced so it did not puncture dura. Biplanar projections were used to confirm position. Aspiration was confirmed to be negative for CSF and/or blood. A 1-2 ml. volume of Isovue-250 was injected and flow of contrast was noted at each level. Radiographs were obtained for documentation purposes.   After attaining the desired flow of contrast documented above, a 0.5 to 1.0 ml test dose of 0.25% Marcaine was injected into each  respective transforaminal space.  The patient was observed for 90 seconds post injection.  After no sensory deficits were reported, and normal lower extremity motor function was noted,   the above injectate was administered so that equal amounts of the injectate were placed at each foramen (level) into the transforaminal epidural space.   Additional Comments:  The patient tolerated the procedure well Dressing: Band-Aid with 2 x 2 sterile gauze    Post-procedure details: Patient was observed during the procedure. Post-procedure instructions were reviewed.  Patient left the clinic in stable condition.

## 2022-06-14 ENCOUNTER — Encounter: Payer: Self-pay | Admitting: Radiology

## 2022-06-27 ENCOUNTER — Telehealth: Payer: Self-pay | Admitting: Physical Medicine and Rehabilitation

## 2022-06-27 NOTE — Telephone Encounter (Signed)
Patient called needing to schedule an appointment with Dr. Ernestina Patches for an injection in her left hip. The number to contact patient is [phone]

## 2022-06-28 ENCOUNTER — Other Ambulatory Visit: Payer: Self-pay | Admitting: Physical Medicine and Rehabilitation

## 2022-06-28 DIAGNOSIS — M25552 Pain in left hip: Secondary | ICD-10-CM

## 2022-06-28 NOTE — Telephone Encounter (Signed)
Spoke with patient and she is requesting a left hip injection. Last injection was 10/2021 for the left hip. She stated it is the same type of pain. No new falls, accidents or injuries. Please advise

## 2022-07-12 ENCOUNTER — Other Ambulatory Visit: Payer: Self-pay

## 2022-07-12 ENCOUNTER — Ambulatory Visit (INDEPENDENT_AMBULATORY_CARE_PROVIDER_SITE_OTHER): Payer: Commercial Managed Care - HMO | Admitting: Physical Medicine and Rehabilitation

## 2022-07-12 DIAGNOSIS — M25552 Pain in left hip: Secondary | ICD-10-CM

## 2022-07-12 DIAGNOSIS — Z96641 Presence of right artificial hip joint: Secondary | ICD-10-CM

## 2022-07-12 DIAGNOSIS — G894 Chronic pain syndrome: Secondary | ICD-10-CM

## 2022-07-12 NOTE — Progress Notes (Signed)
Functional Pain Scale - descriptive words and definitions  Distracting (5)    Aware of pain/able to complete some ADL's but limited by pain/sleep is affected and active distractions are only slightly useful. Moderate range order  Average Pain 4   +Driver, -BT, -Dye Allergies.  Left hip pain. Sitting makes pain worse. Uses Gabapentin, muscle relaxers and ice for pain

## 2022-07-12 NOTE — Progress Notes (Signed)
Kathy Nguyen - 57 y.o. female MRN [id number]  Date of birth: 06-08-1965  Office Visit Note: Visit Date: 07/12/2022 PCP: Lise Auer, MD Referred by: Lise Auer, MD  Subjective: Chief Complaint  Patient presents with   Left Hip - Pain   HPI:  Kathy Nguyen is a 57 y.o. female who comes in today for planned repeat Left anesthetic hip arthrogram with fluoroscopic guidance.  The patient has failed conservative care including home exercise, medications, time and activity modification. Prior injection gave more than 50% relief for several months. This injection will be diagnostic and hopefully therapeutic.  Please see requesting physician notes for further details and justification.  Review of prior fluoroscopic imaging and last x-ray of the hip shows good joint spacing.  Referring: Ellin Goodie, FNP and Dr. Doneen Poisson   ROS Otherwise per HPI.  Assessment & Plan: Visit Diagnoses:    ICD-10-CM   1. Pain in left hip  M25.552 XR C-ARM NO REPORT    2. Status post total replacement of right hip  Z96.641     3. Chronic pain syndrome  G89.4       Plan: No additional findings.   Meds & Orders: No orders of the defined types were placed in this encounter.   Orders Placed This Encounter  Procedures   Large Joint Inj   XR C-ARM NO REPORT    Follow-up: Return for Dr. Doneen Poisson.   Procedures: Large Joint Inj: L hip joint on 07/12/2022 8:45 AM Indications: diagnostic evaluation and pain Details: 22 G 3.5 in needle, fluoroscopy-guided anterior approach  Arthrogram: No  Medications: 4 mL bupivacaine 0.25 %; 40 mg triamcinolone acetonide 40 MG/ML Outcome: tolerated well, no immediate complications  There was excellent flow of contrast producing a partial arthrogram of the hip.  I did have the patient sit for a little while and then walked her out to the front.  The patient really did not notice much difference at all during the anesthetic phase of the  injection. Procedure, treatment alternatives, risks and benefits explained, specific risks discussed. Consent was given by the patient. Immediately prior to procedure a time out was called to verify the correct patient, procedure, equipment, support staff and site/side marked as required. Patient was prepped and draped in the usual sterile fashion.          Clinical History: EXAM: MRI LUMBAR SPINE WITHOUT CONTRAST   TECHNIQUE: Multiplanar, multisequence MR imaging of the lumbar spine was performed. No intravenous contrast was administered.   COMPARISON:  Lumbar spine MRI 01/19/2020. lumbar spine radiographs 12/07/2019.   FINDINGS: Segmentation: 5 lumbar vertebrae. The caudal most well-formed intervertebral disc space is designated L5-S1.   Alignment: Straightening of the expected lumbar lordosis. No significant spondylolisthesis.   Vertebrae: Multilevel small Schmorl nodes. Vertebral body height is otherwise maintained. Trace degenerative edema within the posterior elements on the left at L5. Elsewhere, no significant marrow edema or focal suspicious osseous lesion is identified.   Conus medullaris and cauda equina: Conus extends to the L1 level. No signal abnormality within the visualized distal spinal cord.   Paraspinal and other soft tissues: Small right renal cyst. Paraspinal soft tissues within normal limits.   Disc levels:   Unless otherwise stated, the level by level findings below have not significantly changed since prior MRI 01/19/2020.   Moderate disc degeneration at L5-S1. No more than mild disc degeneration at the remaining levels.   T12-L1: No significant disc herniation or stenosis. Perineural  cyst within the right neural foramen.   L1-L2: Disc bulge with endplate spurring. Mild facet arthrosis. No significant spinal canal or foraminal stenosis.   L2-L3: Minimal facet arthrosis. No significant disc herniation or stenosis.   L3-L4: Disc bulge. Mild  facet arthrosis/ligamentum flavum hypertrophy. No significant spinal canal or foraminal stenosis.   L4-L5: Disc bulge. Superimposed tiny left center disc protrusion (series 6, image 32). Mild facet arthrosis. No significant spinal canal or foraminal stenosis.   L5-S1: Disc bulge with endplate spurring. Superimposed broad-based central disc protrusion slightly eccentric to the right. As before, the disc extrusion likely contacts the bilateral descending S1 nerve roots (series 6, image 38). Central canal patent. Mild relative bilateral neural foraminal narrowing.   IMPRESSION: Lumbar spondylosis as outlined and having not significantly changed from the lumbar spine MRI of 01/19/2020. Findings are most notably as follows.   At L5-S1, there is moderate disc degeneration. Disc bulge with endplate spurring. Superimposed broad-based central disc protrusion slightly eccentric to the right. As before, the disc extrusion likely contacts the bilateral descending S1 nerve roots. Correlate for bilateral S1 radiculopathy. Mild relative bilateral neural foraminal narrowing.   No significant spinal canal or foraminal stenosis at the remaining levels.     Electronically Signed   By: Jackey Loge DO   On: 08/15/2020 08:34     Objective:  VS:  HT:    WT:   BMI:     BP:   HR: bpm  TEMP: ( )  RESP:  Physical Exam   Imaging: No results found.

## 2022-07-16 ENCOUNTER — Telehealth: Payer: Self-pay | Admitting: Physical Medicine and Rehabilitation

## 2022-07-16 NOTE — Telephone Encounter (Signed)
Patient called triage line regarding side effects from left hip injection that she had last Thursday. She states that she has had side effects from the steroids before, but this time has been horrible. She has had facial flushing and swelling with excruciating pain in the face. She has been using ice packs and taking an antihistamine. She has also had trouble breathing when she lays down, irritability, and horrible nightmares last night. She wanted to make mention that when she walked out to get in her car post procedure, she has the worst pain that brought her to tears in her hip. Her hip is feeling much better now.  The injection has helped with hip pain prior to procedure.  Patient would like to know if there was something different used this time for injection, a different contrast, steroid, or manufacturer? She would also like to know if this means that she probably cannot take the steroids any longer? Is there another option besides surgery? She states that she cannot go through this again and would like to know what you recommend?  CB  [phone]

## 2022-07-16 NOTE — Telephone Encounter (Signed)
Spoke with patient this morning via telephone: She recently underwent left intra-articular hip injection in our office on 07/12/2022. She reports injection procedure went smoothly, however immediately after injection while sitting in her car she began to experience increased pain and flushing. 2 days post injection she reports irritability, increased pain, flushing, rash and difficulty sleeping. Also reports worsening depression. Some relief of symptoms with ice packs and Benadryl. I explained to patient that you can experience side effects from steroid medication, however these should resolve quickly. States improvement of left hip pain, however she does not wish to continue with injections due to this reaction. From a safety standpoint, per Dr. Alvester Morin and myself we do not advise further corticosteroid injections. She would like to follow up with Dr. Magnus Ivan to discuss possible surgical options. We are happy to see patient back as needed.

## 2022-07-16 NOTE — Telephone Encounter (Signed)
noted 

## 2022-07-23 MED ORDER — TRIAMCINOLONE ACETONIDE 40 MG/ML IJ SUSP
40.0000 mg | INTRAMUSCULAR | Status: AC | PRN
  Administered 2022-07-12: 40 mg via INTRA_ARTICULAR

## 2022-07-23 MED ORDER — BUPIVACAINE HCL 0.25 % IJ SOLN
4.0000 mL | INTRAMUSCULAR | Status: AC | PRN
  Administered 2022-07-12: 4 mL via INTRA_ARTICULAR

## 2022-07-24 ENCOUNTER — Other Ambulatory Visit: Payer: Self-pay | Admitting: Physician Assistant

## 2022-08-06 ENCOUNTER — Other Ambulatory Visit: Payer: Self-pay | Admitting: Physician Assistant

## 2022-08-28 ENCOUNTER — Ambulatory Visit
Admission: EM | Admit: 2022-08-28 | Discharge: 2022-08-28 | Disposition: A | Discharge status: Home / Self Care | Payer: Commercial Managed Care - HMO | Attending: Family Medicine | Admitting: Family Medicine

## 2022-08-28 ENCOUNTER — Encounter: Payer: Self-pay | Admitting: Emergency Medicine

## 2022-08-28 ENCOUNTER — Other Ambulatory Visit: Payer: Self-pay

## 2022-08-28 DIAGNOSIS — J0100 Acute maxillary sinusitis, unspecified: Secondary | ICD-10-CM

## 2022-08-28 MED ORDER — DOXYCYCLINE HYCLATE 100 MG PO CAPS: 100.0000 mg | ORAL_CAPSULE | Freq: Two times a day (BID) | ORAL | 0 refills | Status: DC

## 2022-08-28 NOTE — ED Triage Notes (Signed)
Pt here for sinus pressure and congestion with body aches x 1 week

## 2022-08-28 NOTE — Discharge Instructions (Signed)
You were diagnosed with a sinus infection today.  I have sent out doxycycline to take twice/day x 10 days.  I recommend you use over the counter flonase, as well as sudafed/claritin for sinus congestion.  You may continue the cough syrup as well.  Please get plenty of rest and fluids.  Please follow up if not improving.

## 2022-08-28 NOTE — ED Provider Notes (Signed)
Kathy Nguyen    CSN: [id number] Arrival date & time: 08/28/22  1106      History   Chief Complaint Chief Complaint  Patient presents with   Facial Pain    HPI Kathy Nguyen is a 57 y.o. female.   Patient is here for URI symptoms for over a week.  She has been having runny nose, congestion.  Having sinus pain and pressure.  Had a sore throat, but improved.  Also with cough. Mild sob, no wheezing.  She had covid x 2 and thinks this affects her breathing.  Using nasal rinses, mucinex, decongestants without much help.  Taking left over cough syrup, promethazine with cough.  She has body aches.  No fevers/chills.        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      Home Medications    Prior to Admission medications   Medication Sig Start Date End Date Taking? Authorizing Provider  ALPRAZolam (XANAX) 0.25 MG tablet Take 0.25 mg by mouth 2 (two) times daily as needed for anxiety. 07/06/19   [provider]  aspirin-sod bicarb-citric acid (ALKA-SELTZER) 325 MG TBEF tablet Take 325 mg by mouth every 6 (six) hours as needed (nausea).    [provider]  buPROPion (WELLBUTRIN XL) 300 MG 24 hr tablet Take 300 mg by mouth daily. 08/14/19   [provider]  Cyanocobalamin (VITAMIN B-12 PO) Take 1 tablet by mouth daily.    [provider]  dicyclomine (BENTYL) 10 MG capsule Take 10 mg by mouth 3 (three) times daily as needed for spasms.    [provider]  estradiol (ESTRACE) 1 MG tablet Take 1 mg by mouth daily.    [provider]  gabapentin (NEURONTIN) 100 MG capsule TAKE 2 CAPSULES BY MOUTH 3 TIMES DAILY. 09/26/21   Kirtland Bouchard, PA-C  l-methylfolate-B6-B12 (METANX) 3-35-2 MG TABS tablet Take 1 tablet by mouth daily.    [provider]  Lactobacillus [facility] WOMEN PO) Take 1 tablet by mouth daily.    [provider]  loratadine (CLARITIN) 10 MG tablet Take 10 mg by mouth daily as needed for allergies.    [provider]  methocarbamol (ROBAXIN) 500 MG tablet TAKE 1 TABLET BY MOUTH EVERY 6 HOURS AS NEEDED FOR MUSCLE SPASMS. 04/13/22   Magnus Ivan,  Vanita Panda, MD  olmesartan-hydrochlorothiazide (BENICAR HCT) 20-12.5 MG tablet Take 1 tablet by mouth every morning. 08/11/19   [provider]  ondansetron (ZOFRAN) 4 MG tablet Take 1 tablet (4 mg total) by mouth every 6 (six) hours. 06/20/21   Cristopher Peru, PA-C  polyethylene glycol (MIRALAX / GLYCOLAX) 17 g packet Take 17 g by mouth daily as needed for moderate constipation.    [provider]  valACYclovir (VALTREX) 500 MG tablet Take 500 mg by mouth daily as needed (outbreak).    [provider]    Family History History reviewed. No pertinent family history.  Social History Social History   Tobacco Use   Smoking status: Never   Smokeless tobacco: Never  Vaping Use   Vaping Use: Never used  Substance Use Topics   Alcohol use: Not  Currently   Drug use: Never     Allergies   Biaxin [clarithromycin] and Penicillins   Review of Systems Review of Systems  Constitutional:  Positive for fatigue. Negative for chills and fever.  HENT:  Positive for congestion, rhinorrhea and sore throat.   Respiratory:  Positive for cough and shortness of breath.   Gastrointestinal: Negative.   Musculoskeletal: Negative.   Psychiatric/Behavioral: Negative.       Physical Exam Triage Vital Signs ED Triage Vitals  Enc Vitals Group     BP 08/28/22 1254 (!) 162/93     Pulse Rate 08/28/22 1254 97     Resp 08/28/22 1254 18     Temp 08/28/22 1254 98 F (36.7 C)     Temp Source 08/28/22 1254 Oral     SpO2 08/28/22 1254 98 %     Weight --      Height --      Head Circumference --      Peak Flow --      Pain Score 08/28/22 1255 3     Pain Loc --      Pain Edu? --      Excl. in GC? --    No data found.  Updated Vital Signs BP (!) 162/93 (BP Location: Left Arm)   Pulse 97   Temp 98 F (36.7 C) (Oral)   Resp 18   SpO2 98%   Visual Acuity Right Eye Distance:   Left Eye Distance:   Bilateral Distance:    Right Eye Near:   Left Eye Near:    Bilateral Near:     Physical Exam Constitutional:      General: She is not in acute distress.    Appearance: Normal appearance. She is ill-appearing.  HENT:     Right Ear: A middle ear effusion is present.     Left Ear: A middle ear effusion is present.     Nose:     Right Sinus: Maxillary sinus tenderness and frontal sinus tenderness present.     Left Sinus: Maxillary sinus tenderness and frontal sinus tenderness present.     Mouth/Throat:     Mouth: Mucous membranes are moist.     Pharynx: No oropharyngeal exudate or posterior oropharyngeal erythema.  Cardiovascular:     Rate and Rhythm: Normal rate and regular rhythm.  Pulmonary:     Effort: Pulmonary effort is normal.     Breath sounds: Normal breath sounds.  Musculoskeletal:     Cervical back: Normal range of  motion and neck supple. No tenderness.  Neurological:     General: No focal deficit present.     Mental Status:  She is alert.  Psychiatric:        Mood and Affect: Mood normal.      UC Treatments / Results  Labs (all labs ordered are listed, but only abnormal results are displayed) Labs Reviewed - No data to display  EKG   Radiology No results found.  Procedures Procedures (including critical Nguyen time)  Medications Ordered in UC Medications - No data to display  Initial Impression / Assessment and Plan / UC Course  I have reviewed the triage vital signs and the nursing notes.  Pertinent labs & imaging results that were available during my Nguyen of the patient were reviewed by me and considered in my medical decision making (see chart for details).  Final Clinical Impressions(s) / UC Diagnoses   Final diagnoses:  Acute non-recurrent maxillary sinusitis     Discharge Instructions      You were diagnosed with a sinus infection today.  I have sent out doxycycline to take twice/day x 10 days.  I recommend you use over the counter flonase, as well as sudafed/claritin for sinus congestion.  You may continue the cough syrup as well.  Please get plenty of rest and fluids.  Please follow up if not improving.     ED Prescriptions     Medication Sig Dispense Auth. Provider   doxycycline (VIBRAMYCIN) 100 MG capsule Take 1 capsule (100 mg total) by mouth 2 (two) times daily. 20 capsule Jannifer Franklin, MD      PDMP not reviewed this encounter.   Jannifer Franklin, MD 08/28/22 [phone]

## 2022-09-05 ENCOUNTER — Other Ambulatory Visit: Payer: Self-pay | Admitting: Physician Assistant
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
## 2022-09-19 ENCOUNTER — Ambulatory Visit: Payer: Commercial Managed Care - HMO | Attending: Rheumatology | Admitting: Rheumatology

## 2022-09-19 ENCOUNTER — Ambulatory Visit (INDEPENDENT_AMBULATORY_CARE_PROVIDER_SITE_OTHER): Payer: Commercial Managed Care - HMO

## 2022-09-19 ENCOUNTER — Encounter: Payer: Self-pay | Admitting: Rheumatology

## 2022-09-19 ENCOUNTER — Ambulatory Visit: Payer: Commercial Managed Care - HMO

## 2022-09-19 VITALS — BP 142/95 | HR 86 | Resp 17 | Ht 64.75 in | Wt 172.8 lb

## 2022-09-19 DIAGNOSIS — M79641 Pain in right hand: Secondary | ICD-10-CM

## 2022-09-19 DIAGNOSIS — M5481 Occipital neuralgia: Secondary | ICD-10-CM

## 2022-09-19 DIAGNOSIS — M5136 Other intervertebral disc degeneration, lumbar region: Secondary | ICD-10-CM | POA: Diagnosis not present

## 2022-09-19 DIAGNOSIS — R5383 Other fatigue: Secondary | ICD-10-CM | POA: Diagnosis not present

## 2022-09-19 DIAGNOSIS — M2550 Pain in unspecified joint: Secondary | ICD-10-CM | POA: Diagnosis not present

## 2022-09-19 DIAGNOSIS — Z96641 Presence of right artificial hip joint: Secondary | ICD-10-CM

## 2022-09-19 DIAGNOSIS — M79671 Pain in right foot: Secondary | ICD-10-CM

## 2022-09-19 DIAGNOSIS — G4709 Other insomnia: Secondary | ICD-10-CM | POA: Diagnosis not present

## 2022-09-19 DIAGNOSIS — M79642 Pain in left hand: Secondary | ICD-10-CM

## 2022-09-19 DIAGNOSIS — Z8659 Personal history of other mental and behavioral disorders: Secondary | ICD-10-CM | POA: Diagnosis not present

## 2022-09-19 DIAGNOSIS — M791 Myalgia, unspecified site: Secondary | ICD-10-CM

## 2022-09-19 DIAGNOSIS — M79672 Pain in left foot: Secondary | ICD-10-CM | POA: Diagnosis not present

## 2022-09-19 DIAGNOSIS — M5030 Other cervical disc degeneration, unspecified cervical region: Secondary | ICD-10-CM | POA: Diagnosis not present

## 2022-09-19 DIAGNOSIS — Z8719 Personal history of other diseases of the digestive system: Secondary | ICD-10-CM

## 2022-09-21 LAB — CYCLIC CITRUL PEPTIDE ANTIBODY, IGG: Cyclic Citrullin Peptide Ab: <16 UNITS

## 2022-09-21 LAB — CBC WITH DIFFERENTIAL/PLATELET
Absolute Monocytes: 480 cells/uL (ref 200–950)
Basophils Absolute: 42 cells/uL (ref 0–200)
Basophils Relative: 0.7 %
Eosinophils Absolute: 222 cells/uL (ref 15–500)
Eosinophils Relative: 3.7 %
HCT: 36.4 % (ref 35.0–45.0)
Hemoglobin: 11.8 g/dL (ref 11.7–15.5)
Lymphs Abs: 1824 cells/uL (ref 850–3900)
MCH: 28.5 pg (ref 27.0–33.0)
MCHC: 32.4 g/dL (ref 32.0–36.0)
MCV: 87.9 fL (ref 80.0–100.0)
MPV: 10.2 fL (ref 7.5–12.5)
Monocytes Relative: 8 %
Neutro Abs: 3432 cells/uL (ref 1500–7800)
Neutrophils Relative %: 57.2 %
Platelets: 269 10*3/uL (ref 140–400)
RBC: 4.14 10*6/uL (ref 3.80–5.10)
RDW: 12.3 % (ref 11.0–15.0)
Total Lymphocyte: 30.4 %
WBC: 6 10*3/uL (ref 3.8–10.8)

## 2022-09-21 LAB — COMPLETE METABOLIC PANEL WITH GFR
AG Ratio: 1.5 (calc) (ref 1.0–2.5)
ALT: 16 U/L (ref 6–29)
AST: 18 U/L (ref 10–35)
Albumin: 4 g/dL (ref 3.6–5.1)
Alkaline phosphatase (APISO): 87 U/L (ref 37–153)
BUN: 12 mg/dL (ref 7–25)
CO2: 27 mmol/L (ref 20–32)
Calcium: 8.9 mg/dL (ref 8.6–10.4)
Chloride: 105 mmol/L (ref 98–110)
Creat: 0.83 mg/dL (ref 0.50–1.03)
Globulin: 2.7 g/dL (calc) (ref 1.9–3.7)
Glucose, Bld: 96 mg/dL (ref 65–99)
Potassium: 4.1 mmol/L (ref 3.5–5.3)
Sodium: 139 mmol/L (ref 135–146)
Total Bilirubin: 0.2 mg/dL (ref 0.2–1.2)
Total Protein: 6.7 g/dL (ref 6.1–8.1)
eGFR: 82 mL/min/{1.73_m2} (ref >=60)

## 2022-09-21 LAB — CK: Total CK: 81 U/L (ref 29–143)

## 2022-09-21 LAB — SEDIMENTATION RATE: Sed Rate: 9 mm/h (ref 0–30)

## 2022-09-21 LAB — ANA: Anti Nuclear Antibody (ANA): NEGATIVE

## 2022-09-21 LAB — RHEUMATOID FACTOR: Rheumatoid fact SerPl-aCnc: <10 IU/mL (ref <14)

## 2022-09-26 ENCOUNTER — Ambulatory Visit (INDEPENDENT_AMBULATORY_CARE_PROVIDER_SITE_OTHER): Payer: Commercial Managed Care - HMO | Admitting: Orthopaedic Surgery

## 2022-09-26 ENCOUNTER — Encounter: Payer: Self-pay | Admitting: Orthopaedic Surgery

## 2022-09-26 DIAGNOSIS — M25552 Pain in left hip: Secondary | ICD-10-CM

## 2022-09-26 MED ORDER — GABAPENTIN 100 MG PO CAPS: 100.0000 mg | ORAL_CAPSULE | Freq: Every day | ORAL | 1 refills | Status: DC | PRN

## 2022-09-26 NOTE — Progress Notes (Signed)
The patient is well-known to [place].  We replaced her right hip several years ago.  She is an active 57 year old female and recently had a steroid injection in her left hip under fluoroscopy by Dr. Alvester Morin.  She says that is doing well at the moment and she has no issues with her left hip.  He does occasionally have a popping sensation with different motions but that is not all the time.  She does take Neurontin on a very limited basis and would like a refill of this.  Its only 100 mg and she says sometimes it is once a day but other times she goes without it.  She is seeing a spine specialist and has radiofrequency ablation of her cervical spine coming up in July.  Her left hip moves smoothly and fluidly with no blocks to rotation and no pain in the groin today at all.  Previous x-rays of the left hip were normal.  She knows that if the left hip starts bothering her worse that our next step would be ordering a MRI arthrogram of that left hip to rule out a labral tear and to assess the cartilage.  She knows to wait till the fall for considering a steroid injection again.

## 2022-10-04 NOTE — Progress Notes (Signed)
Office Visit Note  Patient: Kathy Nguyen             Date of Birth: 04-Jul-1965           MRN: [id number]             PCP: Lise Auer, MD Referring: Lise Auer, MD Visit Date: 10/09/2022 Occupation: @GUAROCC @  Subjective:  Pain in multiple joints  History of Present Illness: Kathy Nguyen is a 57 y.o. female with osteoarthritis and degenerative disc disease.  She returns today after her initial visit on September 19, 2022.  She states she continues to have pain and stiffness in her bilateral hands and her feet.  She has stiffness and discomfort in her neck and lower back.  She states she was at a funeral yesterday and after prolonged standing her lower back pain became worse.  She denies any radiculopathy.  She has an appointment coming up with Dr. Noel Gerold to discuss neck and lower back pain.  She continues to have discomfort from occipital neuralgia.  She gives a history of chronic insomnia due to discomfort.  She also gives history of chronic fatigue.    Activities of Daily Living:  Patient reports morning stiffness for all day. Patient Reports nocturnal pain.  Difficulty dressing/grooming: Reports Difficulty climbing stairs: Reports Difficulty getting out of chair: Reports Difficulty using hands for taps, buttons, cutlery, and/or writing: Reports  Review of Systems  Constitutional:  Positive for fatigue.  HENT:  Negative for mouth sores and mouth dryness.   Eyes:  Negative for dryness.  Respiratory:  Negative for difficulty breathing.   Cardiovascular:  Negative for palpitations.  Gastrointestinal:  Positive for constipation. Negative for blood in stool and diarrhea.  Endocrine: Positive for excessive thirst and increased urination.  Genitourinary:  Positive for involuntary urination.  Musculoskeletal:  Positive for joint pain, gait problem, joint pain, myalgias, morning stiffness, muscle tenderness and myalgias. Negative for joint swelling and muscle weakness.  Skin:  Negative  for color change, rash, hair loss and sensitivity to sunlight.  Allergic/Immunologic: Positive for susceptible to infections.  Neurological:  Positive for dizziness and headaches.  Hematological:  Negative for swollen glands.  Psychiatric/Behavioral:  Positive for depressed mood and sleep disturbance. The patient is nervous/anxious.     PMFS History:  Patient Active Problem List   Diagnosis Date Noted   DDD (degenerative disc disease), cervical 09/19/2022   DDD (degenerative disc disease), lumbar 09/19/2022   Status post total replacement of right hip 09/08/2019   Unilateral primary osteoarthritis, right hip 08/17/2019    Past Medical History:  Diagnosis Date   Anxiety    Arthritis    Depression    Family history of adverse reaction to anesthesia    Father had a hard time awaking   GERD (gastroesophageal reflux disease)    Headache    History of hiatal hernia    Hypertension    Pneumonia    PONV (postoperative nausea and vomiting)     Family History  Problem Relation Age of Onset   Leukemia Mother    Heart disease Father    Pulmonary disease Father    Heart Problems Brother    Arthritis Brother    Arthritis Brother    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 with f/u MRI 1 yr after   BREAST SURGERY  2008   Reduction & Lift  BREAST SURGERY     Implants   PTOSIS REPAIR     TOTAL HIP ARTHROPLASTY Right 09/08/2019   Procedure: RIGHT TOTAL HIP ARTHROPLASTY ANTERIOR APPROACH;  Surgeon: [name], MD;  Location: [facility] OR;  Service: Orthopedics;  Laterality: Right;   TUBAL LIGATION     VULVA / PERINEUM BIOPSY Right    Social History   Social History Narrative   Not on file    There is no immunization history on file for this patient.   Objective: Vital Signs: BP 131/89 (BP Location: Left Arm, Patient Position: Standing, Cuff Size: Normal)   Pulse 85   Resp 16    Ht 5' 4.75" (1.645 m)   Wt 170 lb 3.2 oz (77.2 kg)   BMI 28.54 kg/m    Physical Exam Vitals and nursing note reviewed.  Constitutional:      Appearance: She is well-developed.  HENT:     Head: Normocephalic and atraumatic.  Eyes:     Conjunctiva/sclera: Conjunctivae normal.  Cardiovascular:     Rate and Rhythm: Normal rate and regular rhythm.     Heart sounds: Normal heart sounds.  Pulmonary:     Effort: Pulmonary effort is normal.     Breath sounds: Normal breath sounds.  Abdominal:     General: Bowel sounds are normal.     Palpations: Abdomen is soft.  Musculoskeletal:     Cervical back: Normal range of motion.  Lymphadenopathy:     Cervical: No cervical adenopathy.  Skin:    General: Skin is warm and dry.     Capillary Refill: Capillary refill takes less than 2 seconds.  Neurological:     Mental Status: She is alert and oriented to person, place, and time.  Psychiatric:        Behavior: Behavior normal.      Musculoskeletal Exam: She had limited lateral rotation, flexion and extension with discomfort.  She had limited painful range of motion of lumbar spine.  Shoulders, elbows, wrist joints were in good range of motion.  She had tenderness over bilateral CMC, PIP and DIP joints with thickening of PIP and DIP joints.  Hip joints were in good range of motion.  Left hip joint was replaced.  Knee joints were in good range of motion without any warmth swelling or effusion.  There was no tenderness over ankles or MTPs.  CDAI Exam: CDAI Score: -- Patient Global: --; Provider Global: -- Swollen: --; Tender: -- Joint Exam 10/09/2022   No joint exam has been documented for this visit   There is currently no information documented on the homunculus. Go to the Rheumatology activity and complete the homunculus joint exam.  Investigation: No additional findings.  Imaging: XR Foot 2 Views Left  Result Date: 09/19/2022 PIP and DIP narrowing was noted.  No MTP, intertarsal,  tibiotalar or subtalar joint space narrowing was noted.  No erosive changes were noted. Impression: These findings are suggestive of osteoarthritis of the foot.  XR Foot 2 Views Right  Result Date: 09/19/2022 PIP and DIP narrowing was noted.  No MTP, intertarsal, tibiotalar or subtalar joint space narrowing was noted.  No erosive changes were noted. Impression: These findings are suggestive of osteoarthritis of the foot.  XR Hand 2 View Left  Result Date: 09/19/2022 CMC, PIP and DIP narrowing was noted.  No MCP, intercarpal or radiocarpal joint space narrowing was noted.  No erosive changes were noted. Impression: These findings are suggestive of osteoarthritis of the hand.  XR  Hand 2 View Right  Result Date: 09/19/2022 CMC, PIP and DIP narrowing was noted.  No MCP, intercarpal or radiocarpal joint space narrowing was noted.  No erosive changes were noted. Impression: These findings are suggestive of osteoarthritis of the hand.   Recent Labs: Lab Results  Component Value Date   WBC 6.0 09/19/2022   HGB 11.8 09/19/2022   PLT 269 09/19/2022   NA 139 09/19/2022   K 4.1 09/19/2022   CL 105 09/19/2022   CO2 27 09/19/2022   GLUCOSE 96 09/19/2022   BUN 12 09/19/2022   CREATININE 0.83 09/19/2022   BILITOT 0.2 09/19/2022   AST 18 09/19/2022   ALT 16 09/19/2022   PROT 6.7 09/19/2022   CALCIUM 8.9 09/19/2022   GFRAA >60 09/04/2019   September 19, 2022 sed rate 9, RF negative, anti-CCP negative, ANA negative, CK81  Speciality Comments: No specialty comments available.  Procedures:  No procedures performed Allergies: Biaxin [clarithromycin] and Penicillins   Assessment / Plan:     Visit Diagnoses: Primary osteoarthritis of both hands - History of pain in both hands.  Rheumatoid factor negative, anti-CCP negative, ANA negative, sed rate normal.  Radiographic findings are consistent with OA.  Lab results and x-ray findings were discussed with the patient at length.  X-rays were reviewed with  the patient.  She had bilateral CMC PIP and DIP narrowing.  She has been having increased discomfort in the [facility] joints.  She had tenderness over bilateral CMC joints.  Prescription for right CMC brace was given.  Joint protection muscle strengthening was advised.  A handout on hand exercises was given.  Status post total replacement of right hip-she had good range of motion without discomfort.  Primary osteoarthritis of both feet -she has discomfort in her feet.  No synovitis was noted.  She had bilateral PIP and DIP thickening.  No Achilles tendinitis of Planter fasciitis were noted.  X-rays were consistent with osteoarthritis.  X-rays were reviewed with the patient.  DDD (degenerative disc disease), cervical-she has ongoing pain and discomfort in her neck.  She is followed by Dr. Renette Butters.  She will be getting radiofrequency ablation.  DDD (degenerative disc disease), lumbar-she has been having increased discomfort in her lumbar spine.  Patient states she stood yesterday for couple of hours which triggered the discomfort.  She denies any radiculopathy.  Core strengthening exercises and weight loss was discussed.  Exercises was demonstrated in the office.  A handout on exercise was also provided.  Myalgia-most likely fibromyalgia.  She has generalized pain, positive tender points and hyperalgesia.  CK was normal.  Results were discussed with the patient.  Bilateral occipital neuralgia-neurology referral was placed at the last visit.  Other fatigue-she continues to have generalized fatigue.  Other insomnia-related to nocturnal pain  History of IBS  History of anxiety  Orders: No orders of the defined types were placed in this encounter.  No orders of the defined types were placed in this encounter.    Follow-Up Instructions: Return if symptoms worsen or fail to improve, for Osteoarthritis.   Pollyann Savoy, MD  Note - This record has been created using Animal nutritionist.  Chart  creation errors have been sought, but may not always  have been located. Such creation errors do not reflect on  the standard of medical care.

## 2022-10-09 ENCOUNTER — Ambulatory Visit: Payer: Commercial Managed Care - HMO | Attending: Rheumatology | Admitting: Rheumatology

## 2022-10-09 ENCOUNTER — Encounter: Payer: Self-pay | Admitting: Rheumatology

## 2022-10-09 VITALS — BP 131/89 | HR 85 | Resp 16 | Ht 64.75 in | Wt 170.2 lb

## 2022-10-09 DIAGNOSIS — M5481 Occipital neuralgia: Secondary | ICD-10-CM

## 2022-10-09 DIAGNOSIS — Z96641 Presence of right artificial hip joint: Secondary | ICD-10-CM

## 2022-10-09 DIAGNOSIS — M791 Myalgia, unspecified site: Secondary | ICD-10-CM

## 2022-10-09 DIAGNOSIS — G4709 Other insomnia: Secondary | ICD-10-CM

## 2022-10-09 DIAGNOSIS — Z8659 Personal history of other mental and behavioral disorders: Secondary | ICD-10-CM

## 2022-10-09 DIAGNOSIS — M5030 Other cervical disc degeneration, unspecified cervical region: Secondary | ICD-10-CM | POA: Diagnosis not present

## 2022-10-09 DIAGNOSIS — M19041 Primary osteoarthritis, right hand: Secondary | ICD-10-CM | POA: Diagnosis not present

## 2022-10-09 DIAGNOSIS — M19071 Primary osteoarthritis, right ankle and foot: Secondary | ICD-10-CM | POA: Diagnosis not present

## 2022-10-09 DIAGNOSIS — M5136 Other intervertebral disc degeneration, lumbar region: Secondary | ICD-10-CM

## 2022-10-09 DIAGNOSIS — M19072 Primary osteoarthritis, left ankle and foot: Secondary | ICD-10-CM

## 2022-10-09 DIAGNOSIS — M19042 Primary osteoarthritis, left hand: Secondary | ICD-10-CM

## 2022-10-09 DIAGNOSIS — R5383 Other fatigue: Secondary | ICD-10-CM

## 2022-10-09 DIAGNOSIS — Z8719 Personal history of other diseases of the digestive system: Secondary | ICD-10-CM

## 2022-10-09 NOTE — Patient Instructions (Signed)
Hand Exercises Hand exercises can be helpful for almost anyone. They can strengthen your hands and improve flexibility and movement. The exercises can also increase blood flow to the hands. These results can make your work and daily tasks easier for you. Hand exercises can be especially helpful for people who have joint pain from arthritis or nerve damage from using their hands over and over. These exercises can also help people who injure a hand. Exercises Most of these hand exercises are gentle stretching and motion exercises. It is usually safe to do them often throughout the day. Warming up your hands before exercise may help reduce stiffness. You can do this with gentle massage or by placing your hands in warm water for 10-15 minutes. It is normal to feel some stretching, pulling, tightness, or mild discomfort when you begin new exercises. In time, this will improve. Remember to always be careful and stop right away if you feel sudden, very bad pain or your pain gets worse. You want to get better and be safe. Ask your health care provider which exercises are safe for you. Do exercises exactly as told by your provider and adjust them as told. Do not begin these exercises until told by your provider. Knuckle bend or "claw" fist  Stand or sit with your arm, hand, and all five fingers pointed straight up. Make sure to keep your wrist straight. Gently bend your fingers down toward your palm until the tips of your fingers are touching your palm. Keep your big knuckle straight and only bend the small knuckles in your fingers. Hold this position for 10 seconds. Straighten your fingers back to your starting position. Repeat this exercise 5-10 times with each hand. Full finger fist  Stand or sit with your arm, hand, and all five fingers pointed straight up. Make sure to keep your wrist straight. Gently bend your fingers into your palm until the tips of your fingers are touching the middle of your  palm. Hold this position for 10 seconds. Extend your fingers back to your starting position, stretching every joint fully. Repeat this exercise 5-10 times with each hand. Straight fist  Stand or sit with your arm, hand, and all five fingers pointed straight up. Make sure to keep your wrist straight. Gently bend your fingers at the big knuckle, where your fingers meet your hand, and at the middle knuckle. Keep the knuckle at the tips of your fingers straight and try to touch the bottom of your palm. Hold this position for 10 seconds. Extend your fingers back to your starting position, stretching every joint fully. Repeat this exercise 5-10 times with each hand. Tabletop  Stand or sit with your arm, hand, and all five fingers pointed straight up. Make sure to keep your wrist straight. Gently bend your fingers at the big knuckle, where your fingers meet your hand, as far down as you can. Keep the small knuckles in your fingers straight. Think of forming a tabletop with your fingers. Hold this position for 10 seconds. Extend your fingers back to your starting position, stretching every joint fully. Repeat this exercise 5-10 times with each hand. Finger spread  Place your hand flat on a table with your palm facing down. Make sure your wrist stays straight. Spread your fingers and thumb apart from each other as far as you can until you feel a gentle stretch. Hold this position for 10 seconds. Bring your fingers and thumb tight together again. Hold this position for 10 seconds. Repeat   this exercise 5-10 times with each hand. Making circles  Stand or sit with your arm, hand, and all five fingers pointed straight up. Make sure to keep your wrist straight. Make a circle by touching the tip of your thumb to the tip of your index finger. Hold for 10 seconds. Then open your hand wide. Repeat this motion with your thumb and each of your fingers. Repeat this exercise 5-10 times with each hand. Thumb  motion  Sit with your forearm resting on a table and your wrist straight. Your thumb should be facing up toward the ceiling. Keep your fingers relaxed as you move your thumb. Lift your thumb up as high as you can toward the ceiling. Hold for 10 seconds. Bend your thumb across your palm as far as you can, reaching the tip of your thumb for the small finger (pinkie) side of your palm. Hold for 10 seconds. Repeat this exercise 5-10 times with each hand. Grip strengthening  Hold a stress ball or other soft ball in the middle of your hand. Slowly increase the pressure, squeezing the ball as much as you can without causing pain. Think of bringing the tips of your fingers into the middle of your palm. All of your finger joints should bend when doing this exercise. Hold your squeeze for 10 seconds, then relax. Repeat this exercise 5-10 times with each hand. Contact a health care provider if: Your hand pain or discomfort gets much worse when you do an exercise. Your hand pain or discomfort does not improve within 2 hours after you exercise. If you have either of these problems, stop doing these exercises right away. Do not do them again unless your provider says that you can. Get help right away if: You develop sudden, severe hand pain or swelling. If this happens, stop doing these exercises right away. Do not do them again unless your provider says that you can. This information is not intended to replace advice given to you by your health care provider. Make sure you discuss any questions you have with your health care provider. Document Revised: 04/10/2022 Document Reviewed: 04/10/2022 [employer] Patient Education  2024 [employer]   Low Back Sprain or Strain Rehab Ask your health care provider which exercises are safe for you. Do exercises exactly as told by your health care provider and adjust them as directed. It is normal to feel mild stretching, pulling, tightness, or discomfort as you do  these exercises. Stop right away if you feel sudden pain or your pain gets worse. Do not begin these exercises until told by your health care provider. Stretching and range-of-motion exercises These exercises warm up your muscles and joints and improve the movement and flexibility of your back. These exercises also help to relieve pain, numbness, and tingling. Lumbar rotation  Lie on your back on a firm bed or the floor with your knees bent. Straighten your arms out to your sides so each arm forms a 90-degree angle (right angle) with a side of your body. Slowly move (rotate) both of your knees to one side of your body until you feel a stretch in your lower back (lumbar). Try not to let your shoulders lift off the floor. Hold this position for __________ seconds. Tense your abdominal muscles and slowly move your knees back to the starting position. Repeat this exercise on the other side of your body. Repeat __________ times. Complete this exercise __________ times a day. Single knee to chest  Lie on your back  on a firm bed or the floor with both legs straight. Bend one of your knees. Use your hands to move your knee up toward your chest until you feel a gentle stretch in your lower back and buttock. Hold your leg in this position by holding on to the front of your knee. Keep your other leg as straight as possible. Hold this position for __________ seconds. Slowly return to the starting position. Repeat with your other leg. Repeat __________ times. Complete this exercise __________ times a day. Prone extension on elbows  Lie on your abdomen on a firm bed or the floor (prone position). Prop yourself up on your elbows. Use your arms to help lift your chest up until you feel a gentle stretch in your abdomen and your lower back. This will place some of your body weight on your elbows. If this is uncomfortable, try stacking pillows under your chest. Your hips should stay down, against the  surface that you are lying on. Keep your hip and back muscles relaxed. Hold this position for __________ seconds. Slowly relax your upper body and return to the starting position. Repeat __________ times. Complete this exercise __________ times a day. Strengthening exercises These exercises build strength and endurance in your back. Endurance is the ability to use your muscles for a long time, even after they get tired. Pelvic tilt This exercise strengthens the muscles that lie deep in the abdomen. Lie on your back on a firm bed or the floor with your legs extended. Bend your knees so they are pointing toward the ceiling and your feet are flat on the floor. Tighten your lower abdominal muscles to press your lower back against the floor. This motion will tilt your pelvis so your tailbone points up toward the ceiling instead of pointing to your feet or the floor. To help with this exercise, you may place a small towel under your lower back and try to push your back into the towel. Hold this position for __________ seconds. Let your muscles relax completely before you repeat this exercise. Repeat __________ times. Complete this exercise __________ times a day. Alternating arm and leg raises  Get on your hands and knees on a firm surface. If you are on a hard floor, you may want to use padding, such as an exercise mat, to cushion your knees. Line up your arms and legs. Your hands should be directly below your shoulders, and your knees should be directly below your hips. Lift your left leg behind you. At the same time, raise your right arm and straighten it in front of you. Do not lift your leg higher than your hip. Do not lift your arm higher than your shoulder. Keep your abdominal and back muscles tight. Keep your hips facing the ground. Do not arch your back. Keep your balance carefully, and do not hold your breath. Hold this position for __________ seconds. Slowly return to the starting  position. Repeat with your right leg and your left arm. Repeat __________ times. Complete this exercise __________ times a day. Abdominal set with straight leg raise  Lie on your back on a firm bed or the floor. Bend one of your knees and keep your other leg straight. Tense your abdominal muscles and lift your straight leg up, 4-6 inches (10-15 cm) off the ground. Keep your abdominal muscles tight and hold this position for __________ seconds. Do not hold your breath. Do not arch your back. Keep it flat against the ground. Keep your  abdominal muscles tense as you slowly lower your leg back to the starting position. Repeat with your other leg. Repeat __________ times. Complete this exercise __________ times a day. Single leg lower with bent knees Lie on your back on a firm bed or the floor. Tense your abdominal muscles and lift your feet off the floor, one foot at a time, so your knees and hips are bent in 90-degree angles (right angles). Your knees should be over your hips and your lower legs should be parallel to the floor. Keeping your abdominal muscles tense and your knee bent, slowly lower one of your legs so your toe touches the ground. Lift your leg back up to return to the starting position. Do not hold your breath. Do not let your back arch. Keep your back flat against the ground. Repeat with your other leg. Repeat __________ times. Complete this exercise __________ times a day. Posture and body mechanics Good posture and healthy body mechanics can help to relieve stress in your body's tissues and joints. Body mechanics refers to the movements and positions of your body while you do your daily activities. Posture is part of body mechanics. Good posture means: Your spine is in its natural S-curve position (neutral). Your shoulders are pulled back slightly. Your head is not tipped forward (neutral). Follow these guidelines to improve your posture and body mechanics in your everyday  activities. Standing  When standing, keep your spine neutral and your feet about hip-width apart. Keep a slight bend in your knees. Your ears, shoulders, and hips should line up. When you do a task in which you stand in one place for a long time, place one foot up on a stable object that is 2-4 inches (5-10 cm) high, such as a footstool. This helps keep your spine neutral. Sitting  When sitting, keep your spine neutral and keep your feet flat on the floor. Use a footrest, if necessary, and keep your thighs parallel to the floor. Avoid rounding your shoulders, and avoid tilting your head forward. When working at a desk or a computer, keep your desk at a height where your hands are slightly lower than your elbows. Slide your chair under your desk so you are close enough to maintain good posture. When working at a computer, place your monitor at a height where you are looking straight ahead and you do not have to tilt your head forward or downward to look at the screen. Resting When lying down and resting, avoid positions that are most painful for you. If you have pain with activities such as sitting, bending, stooping, or squatting, lie in a position in which your body does not bend very much. For example, avoid curling up on your side with your arms and knees near your chest (fetal position). If you have pain with activities such as standing for a long time or reaching with your arms, lie with your spine in a neutral position and bend your knees slightly. Try the following positions: Lying on your side with a pillow between your knees. Lying on your back with a pillow under your knees. Lifting  When lifting objects, keep your feet at least shoulder-width apart and tighten your abdominal muscles. Bend your knees and hips and keep your spine neutral. It is important to lift using the strength of your legs, not your back. Do not lock your knees straight out. Always ask for help to lift heavy or  awkward objects. This information is not intended to replace  advice given to you by your health care provider. Make sure you discuss any questions you have with your health care provider. Document Revised: 06/13/2020 Document Reviewed: 06/13/2020 [employer] Patient Education  2024 [employer].

## 2022-10-17 ENCOUNTER — Ambulatory Visit: Payer: Commercial Managed Care - HMO | Admitting: Rheumatology

## 2022-11-21 DIAGNOSIS — M47812 Spondylosis without myelopathy or radiculopathy, cervical region: Secondary | ICD-10-CM | POA: Diagnosis not present

## 2022-12-26 ENCOUNTER — Ambulatory Visit (INDEPENDENT_AMBULATORY_CARE_PROVIDER_SITE_OTHER): Payer: 59 | Admitting: Neurology

## 2022-12-26 ENCOUNTER — Encounter: Payer: Self-pay | Admitting: Neurology

## 2022-12-26 VITALS — BP 119/94 | HR 89 | Ht 65.0 in | Wt 163.0 lb

## 2022-12-26 DIAGNOSIS — M5481 Occipital neuralgia: Secondary | ICD-10-CM

## 2022-12-26 DIAGNOSIS — M7918 Myalgia, other site: Secondary | ICD-10-CM

## 2022-12-26 DIAGNOSIS — G8929 Other chronic pain: Secondary | ICD-10-CM
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
## 2022-12-26 NOTE — Patient Instructions (Signed)
Options discussed:  - neck brace prn - Consider repetitive Occipital nerve blocks and trigger points - Dr. [facility]ale [facility] -dry needling - PT (or [email or web address])  -Lidocaine patches - salonpas or lidocaine patches.  -Gabapentin prn or Lyrica or medical management like muscle relaxers(ie flexeril or others), watch for sedation -Heating pad -Physical Therapy: Cervical myofascial pain, forward posture contributing to occipital neuralgia and cervicalgia. Please evaluate and treat including dry needling, stretching, strengthening, manual therapy/massage, heating, TENS unit, exercising for scapular stabilization, pectoral stretching and rhomboid strengthening as clinically warranted as well as any other modality as recommended by evaluation. -If the above does not help: Consider MRI of the brain and/or cervical spine and/or brain and try to get C2/C3 into the pictures -More invasive options: Epidural Steroid Injections or medial branch blocks for neck pain and occipital neuralgia in the future as needed. Gamma knife or radiofequency Ablation - these are invasive procedures unlike nerve blocks which are more superficial -Differential: Occipital neuralgia, Cervical radiculopathy or arthritis in the neck or muscle spasms in the neck on the left Botox cervical dystonia - Dr. Doroteo Bradford at rehab Repetitive nerve blocks and trigger points injection: Dr. Neale Burly - continue methocarbamol or other muscle relaxers

## 2022-12-30 ENCOUNTER — Encounter: Payer: Self-pay | Admitting: Neurology

## 2022-12-30 NOTE — Addendum Note (Signed)
Addended by: Naomie Dean B on: 12/30/2022 11:38 AM   Modules accepted: Level of Service

## 2023-01-01 ENCOUNTER — Telehealth: Payer: Self-pay | Admitting: Neurology

## 2023-01-01 NOTE — Telephone Encounter (Signed)
[facility] [address], [place] [address] Phone: [phone] Fax: [phone]

## 2023-01-03 ENCOUNTER — Encounter: Payer: Self-pay | Admitting: Physical Medicine & Rehabilitation

## 2023-01-10 DIAGNOSIS — G43019 Migraine without aura, intractable, without status migrainosus: Secondary | ICD-10-CM | POA: Diagnosis not present

## 2023-01-17 DIAGNOSIS — M542 Cervicalgia: Secondary | ICD-10-CM | POA: Diagnosis not present

## 2023-01-17 DIAGNOSIS — M791 Myalgia, unspecified site: Secondary | ICD-10-CM | POA: Diagnosis not present

## 2023-01-17 DIAGNOSIS — G43019 Migraine without aura, intractable, without status migrainosus: Secondary | ICD-10-CM | POA: Diagnosis not present

## 2023-02-01 ENCOUNTER — Encounter: Payer: 59 | Admitting: Physical Medicine & Rehabilitation

## 2023-02-06 DIAGNOSIS — M791 Myalgia, unspecified site: Secondary | ICD-10-CM | POA: Diagnosis not present

## 2023-02-06 DIAGNOSIS — M542 Cervicalgia: Secondary | ICD-10-CM | POA: Diagnosis not present

## 2023-02-06 DIAGNOSIS — G43019 Migraine without aura, intractable, without status migrainosus: Secondary | ICD-10-CM | POA: Diagnosis not present

## 2023-02-27 DIAGNOSIS — G43019 Migraine without aura, intractable, without status migrainosus: Secondary | ICD-10-CM | POA: Diagnosis not present

## 2023-02-27 DIAGNOSIS — M542 Cervicalgia: Secondary | ICD-10-CM | POA: Diagnosis not present

## 2023-02-27 DIAGNOSIS — M791 Myalgia, unspecified site: Secondary | ICD-10-CM | POA: Diagnosis not present

## 2023-03-25 DIAGNOSIS — G43019 Migraine without aura, intractable, without status migrainosus: Secondary | ICD-10-CM | POA: Diagnosis not present

## 2023-03-25 DIAGNOSIS — Z79899 Other long term (current) drug therapy: Secondary | ICD-10-CM | POA: Diagnosis not present

## 2023-03-25 DIAGNOSIS — M791 Myalgia, unspecified site: Secondary | ICD-10-CM | POA: Diagnosis not present

## 2023-03-25 DIAGNOSIS — M542 Cervicalgia: Secondary | ICD-10-CM | POA: Diagnosis not present

## 2023-03-28 ENCOUNTER — Ambulatory Visit
Admission: EM | Admit: 2023-03-28 | Discharge: 2023-03-28 | Disposition: A | Discharge status: Home / Self Care | Payer: 59 | Attending: Family Medicine | Admitting: Family Medicine

## 2023-03-28 DIAGNOSIS — J0100 Acute maxillary sinusitis, unspecified: Secondary | ICD-10-CM | POA: Diagnosis not present

## 2023-03-28 MED ORDER — KETOROLAC TROMETHAMINE 30 MG/ML IJ SOLN
30.0000 mg | Freq: Once | INTRAMUSCULAR | Status: AC
  Administered 2023-03-28: 30 mg via INTRAMUSCULAR

## 2023-03-28 MED ORDER — SULFAMETHOXAZOLE-TRIMETHOPRIM 800-160 MG PO TABS: 1.0000 | ORAL_TABLET | Freq: Two times a day (BID) | ORAL | 0 refills | Status: AC
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
## 2023-03-28 NOTE — Discharge Instructions (Signed)
Meds ordered this encounter  Medications   ketorolac (TORADOL) 30 MG/ML injection 30 mg   sulfamethoxazole-trimethoprim (BACTRIM DS) 800-160 MG tablet    Sig: Take 1 tablet by mouth 2 (two) times daily for 10 days.    Dispense:  20 tablet    Refill:  0

## 2023-03-28 NOTE — ED Triage Notes (Signed)
Patient presents with sinus face pain, headache, ears popping, teeth pain and congestion x week 2. Treated with Advil and Tylenol severe congestion without relief.

## 2023-04-15 DIAGNOSIS — M791 Myalgia, unspecified site: Secondary | ICD-10-CM | POA: Diagnosis not present

## 2023-04-15 DIAGNOSIS — G43019 Migraine without aura, intractable, without status migrainosus: Secondary | ICD-10-CM | POA: Diagnosis not present

## 2023-04-15 DIAGNOSIS — M542 Cervicalgia: Secondary | ICD-10-CM | POA: Diagnosis not present

## 2023-04-25 DIAGNOSIS — G4700 Insomnia, unspecified: Secondary | ICD-10-CM | POA: Diagnosis not present

## 2023-04-25 DIAGNOSIS — E559 Vitamin D deficiency, unspecified: Secondary | ICD-10-CM | POA: Diagnosis not present

## 2023-05-14 DIAGNOSIS — M542 Cervicalgia: Secondary | ICD-10-CM | POA: Diagnosis not present

## 2023-05-14 DIAGNOSIS — M791 Myalgia, unspecified site: Secondary | ICD-10-CM | POA: Diagnosis not present

## 2023-05-14 DIAGNOSIS — G43019 Migraine without aura, intractable, without status migrainosus: Secondary | ICD-10-CM | POA: Diagnosis not present

## 2023-05-22 DIAGNOSIS — K219 Gastro-esophageal reflux disease without esophagitis: Secondary | ICD-10-CM | POA: Diagnosis not present

## 2023-05-22 DIAGNOSIS — I10 Essential (primary) hypertension: Secondary | ICD-10-CM | POA: Diagnosis not present

## 2023-06-11 DIAGNOSIS — M791 Myalgia, unspecified site: Secondary | ICD-10-CM | POA: Diagnosis not present

## 2023-06-11 DIAGNOSIS — M542 Cervicalgia: Secondary | ICD-10-CM | POA: Diagnosis not present

## 2023-06-11 DIAGNOSIS — G43019 Migraine without aura, intractable, without status migrainosus: Secondary | ICD-10-CM | POA: Diagnosis not present

## 2023-06-17 DIAGNOSIS — E559 Vitamin D deficiency, unspecified: Secondary | ICD-10-CM | POA: Diagnosis not present

## 2023-06-17 DIAGNOSIS — H5203 Hypermetropia, bilateral: Secondary | ICD-10-CM | POA: Diagnosis not present

## 2023-06-17 DIAGNOSIS — H04123 Dry eye syndrome of bilateral lacrimal glands: Secondary | ICD-10-CM | POA: Diagnosis not present

## 2023-06-17 DIAGNOSIS — H524 Presbyopia: Secondary | ICD-10-CM | POA: Diagnosis not present

## 2023-06-17 DIAGNOSIS — G4700 Insomnia, unspecified: Secondary | ICD-10-CM | POA: Diagnosis not present

## 2023-06-17 DIAGNOSIS — H16223 Keratoconjunctivitis sicca, not specified as Sjogren's, bilateral: Secondary | ICD-10-CM | POA: Diagnosis not present

## 2023-06-19 ENCOUNTER — Other Ambulatory Visit: Payer: Self-pay

## 2023-06-19 MED ORDER — GABAPENTIN 100 MG PO CAPS: 100.0000 mg | ORAL_CAPSULE | Freq: Every day | ORAL | 0 refills | Status: DC | PRN

## 2023-06-26 DIAGNOSIS — M542 Cervicalgia: Secondary | ICD-10-CM | POA: Diagnosis not present

## 2023-06-26 DIAGNOSIS — M791 Myalgia, unspecified site: Secondary | ICD-10-CM | POA: Diagnosis not present

## 2023-06-26 DIAGNOSIS — G43019 Migraine without aura, intractable, without status migrainosus: Secondary | ICD-10-CM | POA: Diagnosis not present

## 2023-07-09 DIAGNOSIS — L820 Inflamed seborrheic keratosis: Secondary | ICD-10-CM | POA: Diagnosis not present

## 2023-07-09 DIAGNOSIS — L814 Other melanin hyperpigmentation: Secondary | ICD-10-CM | POA: Diagnosis not present

## 2023-07-09 DIAGNOSIS — L578 Other skin changes due to chronic exposure to nonionizing radiation: Secondary | ICD-10-CM | POA: Diagnosis not present

## 2023-07-09 DIAGNOSIS — D225 Melanocytic nevi of trunk: Secondary | ICD-10-CM | POA: Diagnosis not present

## 2023-07-24 DIAGNOSIS — M791 Myalgia, unspecified site: Secondary | ICD-10-CM | POA: Diagnosis not present

## 2023-07-24 DIAGNOSIS — M542 Cervicalgia: Secondary | ICD-10-CM | POA: Diagnosis not present

## 2023-07-24 DIAGNOSIS — G518 Other disorders of facial nerve: Secondary | ICD-10-CM | POA: Diagnosis not present

## 2023-07-24 DIAGNOSIS — G43019 Migraine without aura, intractable, without status migrainosus: Secondary | ICD-10-CM | POA: Diagnosis not present

## 2023-08-09 ENCOUNTER — Other Ambulatory Visit: Payer: Self-pay | Admitting: Orthopaedic Surgery

## 2023-08-19 DIAGNOSIS — L720 Epidermal cyst: Secondary | ICD-10-CM | POA: Diagnosis not present

## 2023-08-20 DIAGNOSIS — K649 Unspecified hemorrhoids: Secondary | ICD-10-CM | POA: Diagnosis not present

## 2023-08-21 DIAGNOSIS — G43019 Migraine without aura, intractable, without status migrainosus: Secondary | ICD-10-CM | POA: Diagnosis not present

## 2023-08-21 DIAGNOSIS — G518 Other disorders of facial nerve: Secondary | ICD-10-CM | POA: Diagnosis not present

## 2023-08-21 DIAGNOSIS — M542 Cervicalgia: Secondary | ICD-10-CM | POA: Diagnosis not present

## 2023-08-21 DIAGNOSIS — M791 Myalgia, unspecified site: Secondary | ICD-10-CM | POA: Diagnosis not present

## 2023-09-17 DIAGNOSIS — K644 Residual hemorrhoidal skin tags: Secondary | ICD-10-CM | POA: Diagnosis not present

## 2023-09-25 DIAGNOSIS — G518 Other disorders of facial nerve: Secondary | ICD-10-CM | POA: Diagnosis not present

## 2023-09-25 DIAGNOSIS — G43719 Chronic migraine without aura, intractable, without status migrainosus: Secondary | ICD-10-CM | POA: Diagnosis not present

## 2023-09-25 DIAGNOSIS — M542 Cervicalgia: Secondary | ICD-10-CM | POA: Diagnosis not present

## 2023-09-25 DIAGNOSIS — M791 Myalgia, unspecified site: Secondary | ICD-10-CM | POA: Diagnosis not present

## 2023-09-30 DIAGNOSIS — M5386 Other specified dorsopathies, lumbar region: Secondary | ICD-10-CM | POA: Diagnosis not present

## 2023-09-30 DIAGNOSIS — G43909 Migraine, unspecified, not intractable, without status migrainosus: Secondary | ICD-10-CM | POA: Diagnosis not present

## 2023-10-10 ENCOUNTER — Other Ambulatory Visit: Payer: Self-pay | Admitting: Orthopaedic Surgery

## 2023-10-18 DIAGNOSIS — M545 Low back pain, unspecified: Secondary | ICD-10-CM | POA: Diagnosis not present

## 2023-10-18 DIAGNOSIS — M79669 Pain in unspecified lower leg: Secondary | ICD-10-CM | POA: Diagnosis not present

## 2023-10-18 DIAGNOSIS — M542 Cervicalgia: Secondary | ICD-10-CM | POA: Diagnosis not present

## 2023-10-29 DIAGNOSIS — G43719 Chronic migraine without aura, intractable, without status migrainosus: Secondary | ICD-10-CM | POA: Diagnosis not present

## 2023-10-29 DIAGNOSIS — M791 Myalgia, unspecified site: Secondary | ICD-10-CM | POA: Diagnosis not present

## 2023-10-29 DIAGNOSIS — M542 Cervicalgia: Secondary | ICD-10-CM | POA: Diagnosis not present

## 2023-11-21 DIAGNOSIS — M542 Cervicalgia: Secondary | ICD-10-CM | POA: Diagnosis not present

## 2023-11-21 DIAGNOSIS — G43719 Chronic migraine without aura, intractable, without status migrainosus: Secondary | ICD-10-CM | POA: Diagnosis not present

## 2023-11-21 DIAGNOSIS — M791 Myalgia, unspecified site: Secondary | ICD-10-CM | POA: Diagnosis not present

## 2023-12-05 DIAGNOSIS — M542 Cervicalgia: Secondary | ICD-10-CM | POA: Diagnosis not present

## 2023-12-05 DIAGNOSIS — G43719 Chronic migraine without aura, intractable, without status migrainosus: Secondary | ICD-10-CM | POA: Diagnosis not present

## 2023-12-05 DIAGNOSIS — M791 Myalgia, unspecified site: Secondary | ICD-10-CM | POA: Diagnosis not present

## 2023-12-23 ENCOUNTER — Ambulatory Visit (INDEPENDENT_AMBULATORY_CARE_PROVIDER_SITE_OTHER): Admitting: Orthopaedic Surgery

## 2023-12-23 ENCOUNTER — Encounter: Payer: Self-pay | Admitting: Orthopaedic Surgery

## 2023-12-23 ENCOUNTER — Other Ambulatory Visit (INDEPENDENT_AMBULATORY_CARE_PROVIDER_SITE_OTHER)

## 2023-12-23 DIAGNOSIS — M25552 Pain in left hip: Secondary | ICD-10-CM
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
## 2023-12-24 ENCOUNTER — Other Ambulatory Visit: Payer: Self-pay

## 2023-12-24 DIAGNOSIS — Z96642 Presence of left artificial hip joint: Secondary | ICD-10-CM

## 2023-12-30 ENCOUNTER — Ambulatory Visit
Admission: RE | Admit: 2023-12-30 | Discharge: 2023-12-30 | Disposition: A | Discharge status: Home / Self Care | Source: Ambulatory Visit | Attending: Orthopaedic Surgery | Admitting: Orthopaedic Surgery

## 2023-12-30 DIAGNOSIS — M1612 Unilateral primary osteoarthritis, left hip: Secondary | ICD-10-CM | POA: Diagnosis not present

## 2023-12-30 DIAGNOSIS — Z96642 Presence of left artificial hip joint: Secondary | ICD-10-CM

## 2024-01-02 ENCOUNTER — Ambulatory Visit: Admitting: Orthopaedic Surgery

## 2024-01-09 DIAGNOSIS — G43719 Chronic migraine without aura, intractable, without status migrainosus: Secondary | ICD-10-CM | POA: Diagnosis not present

## 2024-01-09 DIAGNOSIS — M791 Myalgia, unspecified site: Secondary | ICD-10-CM | POA: Diagnosis not present

## 2024-01-09 DIAGNOSIS — M542 Cervicalgia: Secondary | ICD-10-CM | POA: Diagnosis not present

## 2024-01-15 ENCOUNTER — Ambulatory Visit (INDEPENDENT_AMBULATORY_CARE_PROVIDER_SITE_OTHER): Admitting: Orthopaedic Surgery

## 2024-01-15 ENCOUNTER — Encounter: Payer: Self-pay | Admitting: Orthopaedic Surgery

## 2024-01-15 DIAGNOSIS — M1612 Unilateral primary osteoarthritis, left hip: Secondary | ICD-10-CM | POA: Diagnosis not present

## 2024-01-15 DIAGNOSIS — M25552 Pain in left hip: Secondary | ICD-10-CM

## 2024-01-15 NOTE — Progress Notes (Signed)
 The patient is a very well-known patient of ours who is only 58 years old.  We have replaced her right hip remotely in the past.  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  She has been having worsening left hip pain for some time now which significantly worsened after having a run from these.  It is definitely in the groin and we sent her for a MRI to rule out any type of ligament or tendon tear but also to assess the cartilage.  She still has been quite miserable with the pain with that left hip.  On exam her left hip hurts quite a bit in the groin with internal and external rotation.  The MRI of her left hip does show worsening arthritis of the left hip with mainly cystic changes in the acetabulum and femoral head.  At this point given the severity of her pain and her clinical exam findings combined with her MRI findings, we are recommending a total hip arthroplasty for the left side.  Having had this before on her right side she is fully aware what the surgery involves and the risks and benefits of this type of surgery.  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  All questions and concerns were answered and addressed.  She knows we will be in touch with scheduling the surgery

## 2024-01-16 ENCOUNTER — Telehealth: Payer: Self-pay

## 2024-01-16 NOTE — Telephone Encounter (Signed)
 I called patient to discuss scheduling THA surgery.  Left voice mail message for return call.

## 2024-02-03 ENCOUNTER — Ambulatory Visit: Admitting: Orthopaedic Surgery

## 2024-02-04 DIAGNOSIS — Z9882 Breast implant status: Secondary | ICD-10-CM | POA: Diagnosis not present

## 2024-02-04 DIAGNOSIS — Z1231 Encounter for screening mammogram for malignant neoplasm of breast: Secondary | ICD-10-CM | POA: Diagnosis not present

## 2024-02-05 ENCOUNTER — Other Ambulatory Visit: Payer: Self-pay | Admitting: Physician Assistant

## 2024-02-05 DIAGNOSIS — Z01818 Encounter for other preprocedural examination: Secondary | ICD-10-CM

## 2024-02-10 ENCOUNTER — Encounter: Payer: Self-pay | Admitting: Radiology

## 2024-02-11 NOTE — Progress Notes (Signed)
 Surgical Instructions   Your procedure is scheduled on Tuesday, November 11th. Report to [facility] Main Entrance A at 7:00 A.M., then check in with the Admitting office. Any questions or running late day of surgery: call [phone]  Questions prior to your surgery date: call [phone], Monday-Friday, 8am-4pm. If you experience any cold or flu symptoms such as cough, fever, chills, shortness of breath, etc. between now and your scheduled surgery, please notify us  at the above number.     Remember:  Do not eat after midnight the night before your surgery  You may drink clear liquids until 6:00 the morning of your surgery.   Clear liquids allowed are: Water, Non-Citrus Juices (without pulp), Carbonated Beverages, Clear Tea (no milk, honey, etc.), Black Coffee Only (NO MILK, CREAM OR POWDERED CREAMER of any kind), and Gatorade.  Patient Instructions  The night before surgery:  No food after midnight. ONLY clear liquids after midnight  The day of surgery (if you do NOT have diabetes):  Drink ONE (1) Pre-Surgery Clear Ensure by 6:00 the morning of surgery. Drink in one sitting. Do not sip.  This drink was given to you during your hospital pre-op appointment visit.  Nothing else to drink after completing the Pre-Surgery Clear Ensure.         If you have questions, please contact your surgeon's office.    Take these medicines the morning of surgery with A SIP OF WATER  buPROPion  (WELLBUTRIN  XL)  omeprazole (PRILOSEC)    May take these medicines IF NEEDED: acetaminophen  (TYLENOL )  ALPRAZolam  (XANAX )  dicyclomine (BENTYL)  gabapentin  (NEURONTIN )  methocarbamol  (ROBAXIN )  ondansetron  (ZOFRAN )  valACYclovir  (VALTREX )  Per your physician's instruction, HOLD your tirzepatide (MOUNJARO) until after your surgery.    One week prior to surgery, STOP taking any Aspirin  (unless otherwise instructed by your surgeon) Aleve, Naproxen, Ibuprofen , Motrin , Advil , Goody's, BC's, all  herbal medications, fish oil, and non-prescription vitamins.                     Do NOT Smoke (Tobacco/Vaping) for 24 hours prior to your procedure.  If you use a CPAP at night, you may bring your mask/headgear for your overnight stay.   You will be asked to remove any contacts, glasses, piercing's, hearing aid's, dentures/partials prior to surgery. Please bring cases for these items if needed.    Patients discharged the day of surgery will not be allowed to drive home, and someone needs to stay with them for 24 hours.  SURGICAL WAITING ROOM VISITATION Patients may have no more than 2 support people in the waiting area - these visitors may rotate.   Pre-op nurse will coordinate an appropriate time for 1 ADULT support person, who may not rotate, to accompany patient in pre-op.  Children under the age of 23 must have an adult with them who is not the patient and must remain in the main waiting area with an adult.  If the patient needs to stay at the hospital during part of their recovery, the visitor guidelines for inpatient rooms apply.  Please refer to the [facility] website for the visitor guidelines for any additional information.   If you received a COVID test during your pre-op visit  it is requested that you wear a mask when out in public, stay away from anyone that may not be feeling well and notify your surgeon if you develop symptoms. If you have been in contact with anyone that has tested positive in the  last 10 days please notify you surgeon.      Pre-operative 4 CHG Bathing Instructions   You can play a key role in reducing the risk of infection after surgery. Your skin needs to be as free of germs as possible. You can reduce the number of germs on your skin by washing with CHG (chlorhexidine  gluconate) soap before surgery. CHG is an antiseptic soap that kills germs and continues to kill germs even after washing.   DO NOT use if you have an allergy to chlorhexidine /CHG or  antibacterial soaps. If your skin becomes reddened or irritated, stop using the CHG and notify one of our RNs at [phone].   Please shower with the CHG soap starting 4 days before surgery using the following schedule:     Please keep in mind the following:  DO NOT shave, including legs and underarms, starting the day of your first shower.   You may shave your face at any point before/day of surgery.  Place clean sheets on your bed the day you start using CHG soap. Use a clean washcloth (not used since being washed) for each shower. DO NOT sleep with pets once you start using the CHG.   CHG Shower Instructions:  Wash your face and private area with normal soap. If you choose to wash your hair, wash first with your normal shampoo.  After you use shampoo/soap, rinse your hair and body thoroughly to remove shampoo/soap residue.  Turn the water OFF and apply  bottle of CHG soap to a CLEAN washcloth.  Apply CHG soap ONLY FROM YOUR NECK DOWN TO YOUR TOES (washing for 3-5 minutes)  DO NOT use CHG soap on face, private areas, open wounds, or sores.  Pay special attention to the area where your surgery is being performed.  If you are having back surgery, having someone wash your back for you may be helpful. Wait 2 minutes after CHG soap is applied, then you may rinse off the CHG soap.  Pat dry with a clean towel  Put on clean clothes/pajamas   If you choose to wear lotion, please use ONLY the CHG-compatible lotions that are listed below.  Additional instructions for the day of surgery:  If you choose, you may shower the morning of surgery with an antibacterial soap.  DO NOT APPLY any lotions, deodorants, cologne, or perfumes.   Do not bring valuables to the hospital. [facility] is not responsible for any belongings/valuables. Do not wear nail polish, gel polish, artificial nails, or any other type of covering on natural nails (fingers and toes) Do not wear jewelry or makeup Put on  clean/comfortable clothes.  Please brush your teeth.  Ask your nurse before applying any prescription medications to the skin.     CHG Compatible Lotions   Aveeno Moisturizing lotion  Cetaphil Moisturizing Cream  Cetaphil Moisturizing Lotion  Clairol Herbal Essence Moisturizing Lotion, Dry Skin  Clairol Herbal Essence Moisturizing Lotion, Extra Dry Skin  Clairol Herbal Essence Moisturizing Lotion, Normal Skin  Curel Age Defying Therapeutic Moisturizing Lotion with Alpha Hydroxy  Curel Extreme Care Body Lotion  Curel Soothing Hands Moisturizing Hand Lotion  Curel Therapeutic Moisturizing Cream, Fragrance-Free  Curel Therapeutic Moisturizing Lotion, Fragrance-Free  Curel Therapeutic Moisturizing Lotion, Original Formula  Eucerin Daily Replenishing Lotion  Eucerin Dry Skin Therapy Plus Alpha Hydroxy Crme  Eucerin Dry Skin Therapy Plus Alpha Hydroxy Lotion  Eucerin Original Crme  Eucerin Original Lotion  Eucerin Plus Crme Eucerin Plus Lotion  Eucerin TriLipid Replenishing Lotion  Keri Anti-Bacterial Hand Lotion  Keri Deep Conditioning Original Lotion Dry Skin Formula Softly Scented  Keri Deep Conditioning Original Lotion, Fragrance Free Sensitive Skin Formula  Keri Lotion Fast Absorbing Fragrance Free Sensitive Skin Formula  Keri Lotion Fast Absorbing Softly Scented Dry Skin Formula  Keri Original Lotion  Keri Skin Renewal Lotion Keri Silky Smooth Lotion  Keri Silky Smooth Sensitive Skin Lotion  Nivea Body Creamy Conditioning Oil  Nivea Body Extra Enriched Lotion  Nivea Body Original Lotion  Nivea Body Sheer Moisturizing Lotion Nivea Crme  Nivea Skin Firming Lotion  NutraDerm 30 Skin Lotion  NutraDerm Skin Lotion  NutraDerm Therapeutic Skin Cream  NutraDerm Therapeutic Skin Lotion  ProShield Protective Hand Cream  Provon moisturizing lotion  Please read over the following fact sheets that you were given.

## 2024-02-12 ENCOUNTER — Other Ambulatory Visit: Payer: Self-pay

## 2024-02-12 ENCOUNTER — Encounter (HOSPITAL_COMMUNITY)
Admission: RE | Admit: 2024-02-12 | Discharge: 2024-02-12 | Disposition: A | Discharge status: Home / Self Care | Source: Ambulatory Visit | Attending: Orthopaedic Surgery | Admitting: Orthopaedic Surgery

## 2024-02-12 ENCOUNTER — Encounter (HOSPITAL_COMMUNITY): Payer: Self-pay

## 2024-02-12 VITALS — BP 146/96 | HR 94 | Temp 98.5°F | Resp 17 | Ht 65.0 in | Wt 154.7 lb

## 2024-02-12 DIAGNOSIS — Z01818 Encounter for other preprocedural examination: Secondary | ICD-10-CM | POA: Insufficient documentation

## 2024-02-12 DIAGNOSIS — M5030 Other cervical disc degeneration, unspecified cervical region: Secondary | ICD-10-CM | POA: Insufficient documentation

## 2024-02-12 DIAGNOSIS — K449 Diaphragmatic hernia without obstruction or gangrene: Secondary | ICD-10-CM | POA: Insufficient documentation

## 2024-02-12 DIAGNOSIS — F419 Anxiety disorder, unspecified: Secondary | ICD-10-CM | POA: Insufficient documentation

## 2024-02-12 DIAGNOSIS — I10 Essential (primary) hypertension: Secondary | ICD-10-CM | POA: Insufficient documentation

## 2024-02-12 DIAGNOSIS — Z01810 Encounter for preprocedural cardiovascular examination: Secondary | ICD-10-CM | POA: Diagnosis present

## 2024-02-12 DIAGNOSIS — M1612 Unilateral primary osteoarthritis, left hip: Secondary | ICD-10-CM | POA: Insufficient documentation

## 2024-02-12 DIAGNOSIS — F32A Depression, unspecified: Secondary | ICD-10-CM | POA: Diagnosis not present

## 2024-02-12 DIAGNOSIS — Z96641 Presence of right artificial hip joint: Secondary | ICD-10-CM | POA: Insufficient documentation

## 2024-02-12 DIAGNOSIS — Z01812 Encounter for preprocedural laboratory examination: Secondary | ICD-10-CM | POA: Diagnosis present

## 2024-02-12 DIAGNOSIS — K219 Gastro-esophageal reflux disease without esophagitis: Secondary | ICD-10-CM | POA: Diagnosis not present

## 2024-02-12 LAB — BASIC METABOLIC PANEL WITH GFR
Anion gap: 10 (ref 5–15)
BUN: 16 mg/dL (ref 6–20)
CO2: 26 mmol/L (ref 22–32)
Calcium: 9.5 mg/dL (ref 8.9–10.3)
Chloride: 100 mmol/L (ref 98–111)
Creatinine, Ser: 0.9 mg/dL (ref 0.44–1.00)
GFR, Estimated: >60 mL/min (ref >60)
Glucose, Bld: 91 mg/dL (ref 70–99)
Potassium: 3.9 mmol/L (ref 3.5–5.1)
Sodium: 136 mmol/L (ref 135–145)

## 2024-02-12 LAB — CBC
HCT: 42.4 % (ref 36.0–46.0)
Hemoglobin: 14 g/dL (ref 12.0–15.0)
MCH: 28.9 pg (ref 26.0–34.0)
MCHC: 33 g/dL (ref 30.0–36.0)
MCV: 87.6 fL (ref 80.0–100.0)
Platelets: 302 K/uL (ref 150–400)
RBC: 4.84 MIL/uL (ref 3.87–5.11)
RDW: 12.6 % (ref 11.5–15.5)
WBC: 7.4 K/uL (ref 4.0–10.5)
nRBC: 0 % (ref 0.0–0.2)

## 2024-02-12 LAB — SURGICAL PCR SCREEN
MRSA, PCR: NEGATIVE
Staphylococcus aureus: NEGATIVE

## 2024-02-12 LAB — TYPE AND SCREEN
ABO/RH(D): O POS
Antibody Screen: NEGATIVE

## 2024-02-12 NOTE — Progress Notes (Signed)
 PCP - Dr. Tracey Bathe Cardiologist - Denies  PPM/ICD - denies Device Orders - n/a Rep Notified - n/a  Chest x-ray - n/a EKG - 02-12-24 Stress Test - denies ECHO - denies Cardiac Cath - denies  Sleep Study - Denies CPAP - n/a  Non-diabetic  Last dose of GLP1 agonist- tirzepatide (ZEPBOUND)   GLP1 instructions: per patient last dose on 02-12-24. Pt instructed not to take another dose prior to surgery.  Blood Thinner Instructions: Denies Aspirin  Instructions: Denies  ERAS Protcol - clears until 0600 PRE-SURGERY Ensure or G2- ensure  COVID TEST- n/a   Anesthesia review: yes, HTN, PONV  Patient denies shortness of breath, fever, cough and chest pain at PAT appointment.  Kathy Nguyen. NP saw patient in room.  Pt stated she had a hip surgery in 2021 where she ran high fever and vitals were unstable however could not remember the name of the situation.  Pt stated she had a cervical ablation in June of 2024 where her BP dropped to 90/60 and surgery was stopped and rescheduled to finish in August of 2024. Records requested from [facility] with Dr. Debby Kansky.   All instructions explained to the patient, with a verbal understanding of the material. Patient agrees to go over the instructions while at home for a better understanding. Patient also instructed to self quarantine after being tested for COVID-19. The opportunity to ask questions was provided.

## 2024-02-12 NOTE — Progress Notes (Signed)
 Anesthesia Chart Review:  Case: [id number] Date/Time: 02/18/24 0845   Procedure: ARTHROPLASTY, HIP, TOTAL, ANTERIOR APPROACH (Left: Hip)   Anesthesia type: Spinal   Diagnosis: Primary osteoarthritis of left hip [M16.12]   Pre-op diagnosis: Osteoarthritis Left Hip   [facility] ROOM 05 / [facility]   Surgeons: Vernetta Lonni GRADE, MD       DISCUSSION: Patient is a 58 year old female scheduled for the above procedure.   History includes never smoker, postoperative N/V, HTN, GERD, hiatal hernia, anxiety, depression, headaches, osteoarthritis (right THA 09/08/2019), hysterectomy, degenerative disc disease (cervical).   I evaluated her during her 02/12/2024 PAT visit due to significant hypotension prompting case cancellation and feeling generally poor after her THA. She reported an attempted cervical spine ablation under sedation/MAC anesthesia in July 2024 by Dr. Debby Kansky at the [facility], but the case was reported aborted after her BP dropped to 60/40.  She came back in August 2024 and had a successful procedure.  She had been on Benicar  HCT at the time. Also said her PIV got kinked and questioned if she received meds too quickly after straightening her arm.   For her right THA on 09/08/2019 she had spinal anesthesia. Also received Fentanyl , Versed , and propofol . She had a lot of pain after this surgery and felt generally poor afterwards. Discharge on POD #1 or 2 had been planned, but she was kept until POD #3 due to fever 101.4 on POD #2 and need to improve pain control and mobility prior to discharge home. There is no indication of anesthesia complication or malignant hyperthermia.    She denied cardiac issues. Was running from bees in August 2025 and this really exacerbated her hip pain, so she has not been able to be quite as active. She also has cervical and lower back pain, but does not want to pursue surgery. Despite this she was able to do her house cleaning and  was not restricted with ADLs prior to August. She is a sport and exercise psychologist.   Last dose Zepbound was 02/12/2024 AM.   Anesthesia and procedural notes from [facility] requested.   ADDENDUM 02/13/2024 6:53 PM:  Records received. It sounds like she may have had a vasovagal event after placement of radiofrequency introducer probes. See below summaries. Procedure and Sedation records for both dates are also scanned under the Media tab, AMB Correspondence, 02/12/2024. She was given a total of 2 mg Versed  for the second, successful procedure (instead of 1 mg Versed  with 25 mcg fentanyl  as in the procedure that was aborted).  - 11/21/2022 Radio-frequency ablation of medial branch nerves right C4-5, C5-6 by Dr. Thomas Saullo: Received total 2 mg Versed  (1+0.5+0.5 mg). BP 143-159/86-105; HR 78-94 bpm, O2 sat 97-99%. Dr. Saullo injected 1% Xylocaine  subcutaneously.   - 10/17/2022 Aborted cervical radio-frequency ablation. Received 1 mg Versed  and 25 mcg Fentanyl . BP 166/103, O2 98% at 1:47 PM and 88/66 with O2 sat 80% at 1:59 PM. HR 80-90's. Dr. Saullo injected 1% Xylocaine  subcutaneously. An #18 gauge 10 cm radiofrequency introducer probe was placed under fluoroscopy towards the right C4 articular process. A 2nd and 3rd introducer probes were also placed towards the C5 and C6 articular processes. Per Procedure Note, After superficial initial placement she reported feeling sick.  She felt hot and nauseated.  Her BP was noted to be lower than her normal and the subsequent reading was in the 80's/60's.  She was answering questions but slowly and wanted to continue  with the procedure.  Due to low BP and likely impending syncope the procedure was aborted at this time and the probes were removed.  She was placed in Trendelenburg and monitored for 4-5 minutes during which she gradually felt better.SABRASABRASABRAI will see when we can reschedule her and try again perhaps with additional Versed .   VS: BP (!) 146/96    Pulse 94   Temp 36.9 C   Resp 17   Ht 5' 5 (1.651 m)   Wt 70.2 kg   SpO2 99%   BMI 25.74 kg/m  Heart RRR, no murmur noted. Lungs clear. No carotid bruits noted.   PROVIDERS: Fernand Tracey LABOR, MD is PCP    LABS: Labs reviewed: Acceptable for surgery. (all labs ordered are listed, but only abnormal results are displayed)  Labs Reviewed  SURGICAL PCR SCREEN  CBC  BASIC METABOLIC PANEL WITH GFR  TYPE AND SCREEN    IMAGES: MRI Hip left 12/30/2023: IMPRESSION: 1. Mildly progressive left hip degenerative changes with increased subchondral cyst formation anteriorly in the left acetabulum. No acute osseous findings. 2. Previous right total hip arthroplasty. 3. Interval improvement in previously demonstrated left gluteus minimus tendinosis. No evidence of bursitis.    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 with f/u MRI 1 yr after   BREAST SURGERY  2008   Reduction & Lift   BREAST SURGERY     Implants   PTOSIS REPAIR     TOTAL HIP ARTHROPLASTY Right 09/08/2019   Procedure: RIGHT TOTAL HIP ARTHROPLASTY ANTERIOR APPROACH;  Surgeon: Vernetta Lonni GRADE, MD;  Location: [facility] OR;  Service: Orthopedics;  Laterality: Right;   TUBAL LIGATION     VULVA / PERINEUM BIOPSY Right     MEDICATIONS:  tirzepatide (ZEPBOUND) 2.5 MG/0.5ML Pen   acetaminophen  (TYLENOL ) 500 MG tablet   ALPRAZolam  (XANAX ) 0.25 MG tablet    BOTOX 100 units SOLR injection   buPROPion  (WELLBUTRIN  XL) 150 MG 24 hr tablet   dicyclomine (BENTYL) 10 MG capsule   gabapentin  (NEURONTIN ) 100 MG capsule   Lactobacillus (FLORAJEN WOMEN PO)   methocarbamol  (ROBAXIN ) 500 MG tablet   Multiple Vitamin (MULTIVITAMIN WITH MINERALS) TABS tablet   omeprazole (PRILOSEC) 20 MG capsule   ondansetron  (ZOFRAN ) 4 MG tablet   ondansetron  (ZOFRAN ) 8 MG tablet   polyethylene glycol (MIRALAX  / GLYCOLAX ) 17 g packet   PRESCRIPTION MEDICATION   QULIPTA 60 MG TABS   Rimegepant Sulfate (NURTEC) 75 MG TBDP   tirzepatide (MOUNJARO) 2.5 MG/0.5ML Pen   triamterene-hydrochlorothiazide  (MAXZIDE-25) 37.5-25 MG tablet   valACYclovir  (VALTREX ) 500 MG tablet   zolpidem (AMBIEN) 10 MG tablet    methylPREDNISolone  acetate (DEPO-MEDROL ) injection 80 mg   Aprile Dickenson, PA-C Surgical Short Stay/Anesthesiology [facility] Phone [phone] [facility] Phone [phone] 02/12/2024 5:04 PM

## 2024-02-13 NOTE — Anesthesia Preprocedure Evaluation (Addendum)
 Anesthesia Evaluation  Patient identified by MRN, date of birth, ID band Patient awake    Reviewed: Allergy & Precautions, NPO status , Patient's Chart, lab work & pertinent test results  History of Anesthesia Complications (+) PONV, Family history of anesthesia reaction and history of anesthetic complications (father with hard time waking up)  Airway Mallampati: II  TM Distance: >3 FB Neck ROM: Full    Dental  (+) Dental Advisory Given   Pulmonary neg pulmonary ROS, neg shortness of breath, neg sleep apnea, neg COPD, neg recent URI   Pulmonary exam normal breath sounds clear to auscultation       Cardiovascular hypertension (triamterene-HCTZ), Pt. on medications (-) angina (-) Past MI, (-) Cardiac Stents and (-) CABG (-) dysrhythmias  Rhythm:Regular Rate:Normal     Neuro/Psych neg Seizures PSYCHIATRIC DISORDERS Anxiety Depression    negative neurological ROS     GI/Hepatic Neg liver ROS, hiatal hernia,GERD  Medicated,,  Endo/Other  negative endocrine ROS    Renal/GU negative Renal ROS     Musculoskeletal  (+) Arthritis , Osteoarthritis,    Abdominal   Peds  Hematology negative hematology ROS (+) Lab Results      Component                Value               Date                      WBC                      7.4                 02/12/2024                HGB                      14.0                02/12/2024                HCT                      42.4                02/12/2024                MCV                      87.6                02/12/2024                PLT                      302                 02/12/2024              Anesthesia Other Findings Last Zepbound: 02/13/2024  Reproductive/Obstetrics                              Anesthesia Physical Anesthesia Plan  ASA: 3  Anesthesia Plan: MAC and Spinal   Post-op Pain Management: Tylenol  PO (pre-op)*   Induction:  Intravenous  PONV Risk Score and Plan: 3 and Ondansetron , Dexamethasone ,  Propofol  infusion, TIVA, Midazolam  and Treatment may vary due to age or medical condition  Airway Management Planned: Natural Airway and Simple Face Mask  Additional Equipment:   Intra-op Plan:   Post-operative Plan:   Informed Consent: I have reviewed the patients History and Physical, chart, labs and discussed the procedure including the risks, benefits and alternatives for the proposed anesthesia with the patient or authorized representative who has indicated his/her understanding and acceptance.     Dental advisory given  Plan Discussed with: CRNA and Anesthesiologist  Anesthesia Plan Comments: (See PAT note written by Isaiah Ruder, PA-C regarding prior anesthesia history.    Patient last Zepbound 5 days ago. POCUS gastric ultrasound negative for food/liquid in stomach. Grade 0. Will proceed with spinal. I have discussed risks of neuraxial anesthesia including but not limited to infection, bleeding, nerve injury, back pain, headache, seizures, and failure of block. Patient denies bleeding disorders and is not currently anticoagulated. Labs have been reviewed. Risks and benefits discussed. All patient's questions answered.   Discussed with patient risks of MAC including, but not limited to, minor pain or discomfort, hearing people in the room, and possible need for backup general anesthesia. Risks for general anesthesia also discussed including, but not limited to, sore throat, hoarse voice, chipped/damaged teeth, injury to vocal cords, nausea and vomiting, allergic reactions, lung infection, heart attack, stroke, and death. All questions answered.   )         Anesthesia Quick Evaluation

## 2024-02-14 ENCOUNTER — Telehealth: Payer: Self-pay | Admitting: *Deleted

## 2024-02-14 NOTE — Telephone Encounter (Signed)
 See care plan note

## 2024-02-14 NOTE — Care Plan (Signed)
 Attempted call to patient for pre-op call. No answer and left VM.

## 2024-02-17 DIAGNOSIS — M1612 Unilateral primary osteoarthritis, left hip: Principal | ICD-10-CM | POA: Insufficient documentation

## 2024-02-17 NOTE — Care Plan (Signed)
 Ortho Bundle Case Management Note  Patient Details  Name: Kathy Nguyen MRN: [id number] Date of Birth: 07-May-1965   Spoke with patient at length today about her upcoming Left total hip arthroplasty with Dr. Vernetta on 02/18/24. She is agreeable to case management. Her plan is to return home after discharge. She lives with her autistic grand-daughter, who is 69 and very high functioning. 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 She also has a live-in boyfriend that can assist some, but he goes to work during the day. Patient has had a previous hip arthroplasty and is somewhat aware of her recovery. She has DME already (RW and 3in1/BSC). Anticipate HHPT will be needed after hospitalization. Referral made to [facility] after choice provided. RNCM did contact [employer] and left VM with patient's patient navigator requesting if she has a benefit of prepared meals delivered. At time of call, no return call yet. Reviewed all post op care instructions as well as anesthesia note and reviewed Risk assessment. Will continue to follow for needs.                 DME Arranged:   (Has RW and 3in1/BSC already) DME Agency:     HH Arranged:  PT HH Agency:  [facility]  Additional Comments: Please contact me with any questions of if this plan should need to change.  Tylene Ned, RN, BSN, [employer]  [phone] 02/17/2024, 1:24 PM

## 2024-02-17 NOTE — H&P (Signed)
 TOTAL HIP ADMISSION H&P  Patient is admitted for left total hip arthroplasty.  Subjective:  Chief Complaint: left hip pain  HPI: Kathy Nguyen, 58 y.o. female, has a history of pain and functional disability in the left hip(s) due to arthritis and patient has failed non-surgical conservative treatments for greater than 12 weeks to include NSAID's and/or analgesics and activity modification.  Onset of symptoms was gradual starting 1 years ago with gradually worsening course since that time.The patient noted no past surgery on the left hip(s).  Patient currently rates pain in the left hip at 10 out of 10 with activity. Patient has night pain, worsening of pain with activity and weight bearing, pain that interfers with activities of daily living, and pain with passive range of motion. Patient has evidence of subchondral sclerosis, periarticular osteophytes, and joint space narrowing by imaging studies. This condition presents safety issues increasing the risk of falls.  There is no current active infection.  Patient Active Problem List   Diagnosis Date Noted   Unilateral primary osteoarthritis, left hip 02/17/2024   DDD (degenerative disc disease), cervical 09/19/2022   DDD (degenerative disc disease), lumbar 09/19/2022   Status post total replacement of right hip 09/08/2019   Past Medical History:  Diagnosis Date   Anxiety    Arthritis    Depression    Family history of adverse reaction to anesthesia    Father had a hard time awaking   GERD (gastroesophageal reflux disease)    Headache    History of hiatal hernia    Hypertension    Pneumonia    PONV (postoperative nausea and vomiting)     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 with f/u MRI 1 yr after   BREAST SURGERY  2008   Reduction & Lift   BREAST SURGERY     Implants   PTOSIS REPAIR     TOTAL HIP ARTHROPLASTY Right 09/08/2019    Procedure: RIGHT TOTAL HIP ARTHROPLASTY ANTERIOR APPROACH;  Surgeon: Vernetta Lonni GRADE, MD;  Location: [facility] OR;  Service: Orthopedics;  Laterality: Right;   TUBAL LIGATION     VULVA / PERINEUM BIOPSY Right     Current Facility-Administered Medications  Medication Dose Route Frequency Provider Last Rate Last Admin   methylPREDNISolone  acetate (DEPO-MEDROL ) injection 80 mg  80 mg Other Once Newton, Frederic, MD       Current Outpatient Medications  Medication Sig Dispense Refill Last Dose/Taking   acetaminophen  (TYLENOL ) 500 MG tablet Take 1,000 mg by mouth every 6 (six) hours as needed for moderate pain (pain score 4-6).   Taking As Needed   ALPRAZolam  (XANAX ) 0.25 MG tablet Take 0.25 mg by mouth 2 (two) times daily as needed for anxiety.   Taking As Needed   BOTOX 100 units SOLR injection Inject 100 Units into the muscle every 3 (three) months.   Taking   buPROPion  (WELLBUTRIN  XL) 150 MG 24 hr tablet Take 150 mg by mouth daily.   Taking   dicyclomine (BENTYL) 10 MG capsule Take 10 mg by mouth 3 (three) times daily as needed for spasms.   Taking As Needed   gabapentin  (NEURONTIN ) 100 MG capsule TAKE 1 CAPSULE BY MOUTH DAILY AS NEEDED 90 capsule 3 Taking As Needed   Lactobacillus (FLORAJEN WOMEN PO) Take 1 tablet by mouth daily.   Taking   methocarbamol  (ROBAXIN ) 500 MG tablet TAKE 1 TABLET  BY MOUTH EVERY 6 HOURS AS NEEDED FOR MUSCLE SPASMS. 40 tablet 1 Taking As Needed   Multiple Vitamin (MULTIVITAMIN WITH MINERALS) TABS tablet Take 1 tablet by mouth daily.   Taking   omeprazole (PRILOSEC) 20 MG capsule Take 20 mg by mouth in the morning and at bedtime.   Taking   ondansetron  (ZOFRAN ) 8 MG tablet Take 8 mg by mouth every 8 (eight) hours as needed for nausea or vomiting.   Taking As Needed   polyethylene glycol (MIRALAX  / GLYCOLAX ) 17 g packet Take 17 g by mouth daily as needed for moderate constipation.   Taking As Needed   PRESCRIPTION MEDICATION Progesterone 100 mg / Estradiol  1 mg &  Testosterone - Hormone Replacement Therapy done every 4 months   Taking   QULIPTA 60 MG TABS Take 60 mg by mouth at bedtime.   Taking   Rimegepant Sulfate (NURTEC) 75 MG TBDP Take 75 mg by mouth daily as needed (migraine).   Taking As Needed   tirzepatide (MOUNJARO) 2.5 MG/0.5ML Pen Inject 2.5 mg into the skin once a week.   Taking   triamterene-hydrochlorothiazide  (MAXZIDE-25) 37.5-25 MG tablet Take 0.5 tablets by mouth daily.   Taking   valACYclovir  (VALTREX ) 500 MG tablet Take 500 mg by mouth daily as needed (outbreak).   Taking As Needed   zolpidem (AMBIEN) 10 MG tablet Take 5 mg by mouth at bedtime as needed for sleep.   Taking As Needed   ondansetron  (ZOFRAN ) 4 MG tablet Take 1 tablet (4 mg total) by mouth every 6 (six) hours. (Patient not taking: Reported on 02/10/2024) 12 tablet 0 Not Taking   tirzepatide (ZEPBOUND) 2.5 MG/0.5ML Pen Inject 2.5 mg into the skin once a week.      Allergies  Allergen Reactions   Biaxin [Clarithromycin] Rash   Penicillins Hives and Rash    TOLERATED ANCEF  09/08/2019.    Social History   Tobacco Use   Smoking status: Never    Passive exposure: Past   Smokeless tobacco: Never  Substance Use Topics   Alcohol use: Yes    Comment: occ    Family History  Problem Relation Age of Onset   Leukemia Mother    Heart disease Father    Pulmonary disease Father    Heart Problems Brother    Arthritis Brother    Arthritis Brother      Review of Systems  Objective:  Physical Exam Vitals reviewed.  Constitutional:      Appearance: Normal appearance. She is normal weight.  HENT:     Head: Normocephalic and atraumatic.  Eyes:     Extraocular Movements: Extraocular movements intact.     Pupils: Pupils are equal, round, and reactive to light.  Cardiovascular:     Rate and Rhythm: Normal rate and regular rhythm.  Pulmonary:     Effort: Pulmonary effort is normal.  Abdominal:     Palpations: Abdomen is soft.  Musculoskeletal:     Cervical back: Normal  range of motion and neck supple.     Left hip: Tenderness and bony tenderness present. Decreased range of motion. Decreased strength.  Neurological:     Mental Status: She is alert and oriented to person, place, and time.  Psychiatric:        Behavior: Behavior normal.     Vital signs in last 24 hours:    Labs:   Estimated body mass index is 25.74 kg/m as calculated from the following:   Height as of 02/12/24: 5'  5 (1.651 m).   Weight as of 02/12/24: 70.2 kg.   Imaging Review Plain radiographs demonstrate significant degenerative joint disease of the left hip(s). The bone quality appears to be excellent for age and reported activity level.      Assessment/Plan:  End stage arthritis, left hip(s)  The patient history, physical examination, clinical judgement of the provider and imaging studies are consistent with end stage degenerative joint disease of the left hip(s) and total hip arthroplasty is deemed medically necessary. The treatment options including medical management, injection therapy, arthroscopy and arthroplasty were discussed at length. The risks and benefits of total hip arthroplasty were presented and reviewed. The risks due to aseptic loosening, infection, stiffness, dislocation/subluxation,  thromboembolic complications and other imponderables were discussed.  The patient acknowledged the explanation, agreed to proceed with the plan and consent was signed. Patient is being admitted for inpatient treatment for surgery, pain control, PT, OT, prophylactic antibiotics, VTE prophylaxis, progressive ambulation and ADL's and discharge planning.The patient is planning to be discharged home with home health services

## 2024-02-18 ENCOUNTER — Ambulatory Visit (HOSPITAL_COMMUNITY)

## 2024-02-18 ENCOUNTER — Ambulatory Visit (HOSPITAL_COMMUNITY): Payer: Self-pay | Admitting: Vascular Surgery

## 2024-02-18 ENCOUNTER — Encounter (HOSPITAL_COMMUNITY)
Admission: AD | Disposition: A | Discharge status: Home Health | Payer: Self-pay | Source: Home / Self Care | Attending: Orthopaedic Surgery

## 2024-02-18 ENCOUNTER — Other Ambulatory Visit: Payer: Self-pay

## 2024-02-18 ENCOUNTER — Ambulatory Visit (HOSPITAL_COMMUNITY): Admitting: Registered Nurse

## 2024-02-18 ENCOUNTER — Observation Stay (HOSPITAL_COMMUNITY)

## 2024-02-18 ENCOUNTER — Inpatient Hospital Stay (HOSPITAL_COMMUNITY)
Admission: AD | Admit: 2024-02-18 | Discharge: 2024-02-21 | DRG: 470 | Disposition: A | Discharge status: Home Health | Attending: Orthopaedic Surgery | Admitting: Orthopaedic Surgery

## 2024-02-18 ENCOUNTER — Encounter (HOSPITAL_COMMUNITY): Payer: Self-pay | Admitting: Orthopaedic Surgery

## 2024-02-18 DIAGNOSIS — M1612 Unilateral primary osteoarthritis, left hip: Secondary | ICD-10-CM

## 2024-02-18 DIAGNOSIS — Z880 Allergy status to penicillin: Secondary | ICD-10-CM

## 2024-02-18 DIAGNOSIS — Z806 Family history of leukemia: Secondary | ICD-10-CM

## 2024-02-18 DIAGNOSIS — Z9181 History of falling: Secondary | ICD-10-CM

## 2024-02-18 DIAGNOSIS — Z8701 Personal history of pneumonia (recurrent): Secondary | ICD-10-CM

## 2024-02-18 DIAGNOSIS — K219 Gastro-esophageal reflux disease without esophagitis: Secondary | ICD-10-CM | POA: Diagnosis present

## 2024-02-18 DIAGNOSIS — F418 Other specified anxiety disorders: Secondary | ICD-10-CM

## 2024-02-18 DIAGNOSIS — Z79899 Other long term (current) drug therapy: Secondary | ICD-10-CM

## 2024-02-18 DIAGNOSIS — Z881 Allergy status to other antibiotic agents status: Secondary | ICD-10-CM

## 2024-02-18 DIAGNOSIS — Z8261 Family history of arthritis: Secondary | ICD-10-CM

## 2024-02-18 DIAGNOSIS — Z96642 Presence of left artificial hip joint: Secondary | ICD-10-CM

## 2024-02-18 DIAGNOSIS — Z96641 Presence of right artificial hip joint: Secondary | ICD-10-CM | POA: Diagnosis present

## 2024-02-18 DIAGNOSIS — I10 Essential (primary) hypertension: Secondary | ICD-10-CM

## 2024-02-18 DIAGNOSIS — Z8249 Family history of ischemic heart disease and other diseases of the circulatory system: Secondary | ICD-10-CM

## 2024-02-18 DIAGNOSIS — Z90710 Acquired absence of both cervix and uterus: Secondary | ICD-10-CM

## 2024-02-18 DIAGNOSIS — Z7985 Long-term (current) use of injectable non-insulin antidiabetic drugs: Secondary | ICD-10-CM

## 2024-02-18 HISTORY — PX: TOTAL HIP ARTHROPLASTY: SHX124

## 2024-02-18 SURGERY — ARTHROPLASTY, HIP, TOTAL, ANTERIOR APPROACH
Anesthesia: Monitor Anesthesia Care | Site: Hip | Laterality: Left

## 2024-02-18 MED ORDER — METHOCARBAMOL 500 MG PO TABS
500.0000 mg | ORAL_TABLET | Freq: Four times a day (QID) | ORAL | Status: DC | PRN
  Administered 2024-02-18 – 2024-02-20 (×7): 500 mg via ORAL
  Filled 2024-02-18 (×7): qty 1

## 2024-02-18 MED ORDER — HYDROMORPHONE HCL 1 MG/ML IJ SOLN
0.2500 mg | INTRAMUSCULAR | Status: DC | PRN
  Administered 2024-02-18 (×4): 0.5 mg via INTRAVENOUS

## 2024-02-18 MED ORDER — KETAMINE HCL 10 MG/ML IJ SOLN
INTRAMUSCULAR | Status: DC | PRN
  Administered 2024-02-18: 30 mg via INTRAVENOUS

## 2024-02-18 MED ORDER — SODIUM CHLORIDE 0.9 % IV SOLN: INTRAVENOUS | Status: DC

## 2024-02-18 MED ORDER — CEFAZOLIN SODIUM-DEXTROSE 2-4 GM/100ML-% IV SOLN
INTRAVENOUS | Status: AC
  Filled 2024-02-18: qty 100

## 2024-02-18 MED ORDER — ASPIRIN 81 MG PO CHEW
81.0000 mg | CHEWABLE_TABLET | Freq: Two times a day (BID) | ORAL | Status: DC
  Administered 2024-02-18 – 2024-02-21 (×6): 81 mg via ORAL
  Filled 2024-02-18 (×6): qty 1

## 2024-02-18 MED ORDER — DEXAMETHASONE SOD PHOSPHATE PF 10 MG/ML IJ SOLN
INTRAMUSCULAR | Status: DC | PRN
  Administered 2024-02-18: 10 mg via INTRAVENOUS

## 2024-02-18 MED ORDER — POLYETHYLENE GLYCOL 3350 17 G PO PACK: 17.0000 g | PACK | Freq: Every day | ORAL | Status: DC | PRN

## 2024-02-18 MED ORDER — FENTANYL CITRATE (PF) 250 MCG/5ML IJ SOLN
INTRAMUSCULAR | Status: DC | PRN
Start: 2024-02-18 — End: 2024-02-18
  Administered 2024-02-18: 100 ug via INTRAVENOUS

## 2024-02-18 MED ORDER — MIDAZOLAM HCL 2 MG/2ML IJ SOLN
INTRAMUSCULAR | Status: AC
Start: 2024-02-18 — End: 2024-02-18
  Filled 2024-02-18: qty 2

## 2024-02-18 MED ORDER — 0.9 % SODIUM CHLORIDE (POUR BTL) OPTIME
TOPICAL | Status: DC | PRN
  Administered 2024-02-18: 1000 mL

## 2024-02-18 MED ORDER — ACETAMINOPHEN 500 MG PO TABS
1000.0000 mg | ORAL_TABLET | Freq: Once | ORAL | Status: AC
  Administered 2024-02-18: 1000 mg via ORAL
  Filled 2024-02-18: qty 2

## 2024-02-18 MED ORDER — ONDANSETRON HCL 4 MG/2ML IJ SOLN
4.0000 mg | Freq: Four times a day (QID) | INTRAMUSCULAR | Status: DC | PRN
  Administered 2024-02-18: 4 mg via INTRAVENOUS
  Filled 2024-02-18: qty 2

## 2024-02-18 MED ORDER — TRANEXAMIC ACID-NACL 1000-0.7 MG/100ML-% IV SOLN
1000.0000 mg | INTRAVENOUS | Status: AC
  Administered 2024-02-18: 1000 mg via INTRAVENOUS

## 2024-02-18 MED ORDER — BUPROPION HCL ER (XL) 150 MG PO TB24
150.0000 mg | ORAL_TABLET | Freq: Every day | ORAL | Status: DC
  Administered 2024-02-19 – 2024-02-21 (×3): 150 mg via ORAL
  Filled 2024-02-18 (×3): qty 1

## 2024-02-18 MED ORDER — AMISULPRIDE (ANTIEMETIC) 5 MG/2ML IV SOLN
INTRAVENOUS | Status: AC
Start: 2024-02-18 — End: 2024-02-18
  Filled 2024-02-18: qty 4

## 2024-02-18 MED ORDER — POVIDONE-IODINE 10 % EX SWAB
2.0000 | Freq: Once | CUTANEOUS | Status: AC
  Administered 2024-02-18: 2 via TOPICAL

## 2024-02-18 MED ORDER — BUPIVACAINE IN DEXTROSE 0.75-8.25 % IT SOLN
INTRATHECAL | Status: DC | PRN
  Administered 2024-02-18: 1.8 mL via INTRATHECAL

## 2024-02-18 MED ORDER — FENTANYL CITRATE (PF) 100 MCG/2ML IJ SOLN
INTRAMUSCULAR | Status: AC
  Filled 2024-02-18: qty 2

## 2024-02-18 MED ORDER — ATOGEPANT 60 MG PO TABS: 60.0000 mg | ORAL_TABLET | Freq: Every day | ORAL | Status: DC

## 2024-02-18 MED ORDER — DOCUSATE SODIUM 100 MG PO CAPS
100.0000 mg | ORAL_CAPSULE | Freq: Two times a day (BID) | ORAL | Status: DC
  Administered 2024-02-18 – 2024-02-21 (×6): 100 mg via ORAL
  Filled 2024-02-18 (×7): qty 1

## 2024-02-18 MED ORDER — LACTATED RINGERS IV SOLN: INTRAVENOUS | Status: DC

## 2024-02-18 MED ORDER — CHLORHEXIDINE GLUCONATE 0.12 % MT SOLN
OROMUCOSAL | Status: AC
  Administered 2024-02-18: 15 mL via OROMUCOSAL
  Filled 2024-02-18: qty 15

## 2024-02-18 MED ORDER — CHLORHEXIDINE GLUCONATE 0.12 % MT SOLN: 15.0000 mL | Freq: Once | OROMUCOSAL | Status: AC

## 2024-02-18 MED ORDER — ALPRAZOLAM 0.25 MG PO TABS
0.2500 mg | ORAL_TABLET | Freq: Two times a day (BID) | ORAL | Status: DC | PRN
  Administered 2024-02-18 – 2024-02-21 (×6): 0.25 mg via ORAL
  Filled 2024-02-18 (×7): qty 1

## 2024-02-18 MED ORDER — ACETAMINOPHEN 325 MG PO TABS
325.0000 mg | ORAL_TABLET | Freq: Four times a day (QID) | ORAL | Status: DC | PRN
  Administered 2024-02-20 (×3): 650 mg via ORAL
  Filled 2024-02-18 (×3): qty 2

## 2024-02-18 MED ORDER — CEFAZOLIN SODIUM-DEXTROSE 2-4 GM/100ML-% IV SOLN
2.0000 g | INTRAVENOUS | Status: AC
  Administered 2024-02-18: 2 g via INTRAVENOUS

## 2024-02-18 MED ORDER — OXYCODONE HCL 5 MG PO TABS
10.0000 mg | ORAL_TABLET | ORAL | Status: DC | PRN
  Administered 2024-02-18 – 2024-02-21 (×10): 15 mg via ORAL
  Filled 2024-02-18 (×10): qty 3

## 2024-02-18 MED ORDER — PHENOL 1.4 % MT LIQD: 1.0000 | OROMUCOSAL | Status: DC | PRN

## 2024-02-18 MED ORDER — TRANEXAMIC ACID-NACL 1000-0.7 MG/100ML-% IV SOLN
INTRAVENOUS | Status: AC
  Filled 2024-02-18: qty 100

## 2024-02-18 MED ORDER — DICYCLOMINE HCL 10 MG PO CAPS
10.0000 mg | ORAL_CAPSULE | Freq: Three times a day (TID) | ORAL | Status: DC | PRN
Start: 2024-02-18 — End: 2024-02-21

## 2024-02-18 MED ORDER — CEFAZOLIN SODIUM-DEXTROSE 2-4 GM/100ML-% IV SOLN
2.0000 g | Freq: Four times a day (QID) | INTRAVENOUS | Status: AC
  Administered 2024-02-18 (×2): 2 g via INTRAVENOUS
  Filled 2024-02-18 (×2): qty 100

## 2024-02-18 MED ORDER — HYDROMORPHONE HCL 1 MG/ML IJ SOLN
INTRAMUSCULAR | Status: AC
  Filled 2024-02-18: qty 1

## 2024-02-18 MED ORDER — METHOCARBAMOL 500 MG PO TABS
ORAL_TABLET | ORAL | Status: AC
Start: 2024-02-18 — End: 2024-02-18
  Filled 2024-02-18: qty 1

## 2024-02-18 MED ORDER — OXYCODONE HCL 5 MG/5ML PO SOLN: 5.0000 mg | Freq: Once | ORAL | Status: DC | PRN

## 2024-02-18 MED ORDER — MIDAZOLAM HCL (PF) 2 MG/2ML IJ SOLN
INTRAMUSCULAR | Status: DC | PRN
  Administered 2024-02-18 (×2): 2 mg via INTRAVENOUS

## 2024-02-18 MED ORDER — OXYCODONE HCL 5 MG PO TABS
5.0000 mg | ORAL_TABLET | ORAL | Status: DC | PRN
  Administered 2024-02-18: 10 mg via ORAL

## 2024-02-18 MED ORDER — ONDANSETRON HCL 4 MG PO TABS
8.0000 mg | ORAL_TABLET | Freq: Four times a day (QID) | ORAL | Status: DC | PRN
  Administered 2024-02-18 – 2024-02-19 (×4): 8 mg via ORAL
  Filled 2024-02-18 (×4): qty 2

## 2024-02-18 MED ORDER — PROPOFOL 10 MG/ML IV BOLUS
INTRAVENOUS | Status: DC | PRN
  Administered 2024-02-18: 20 mg via INTRAVENOUS

## 2024-02-18 MED ORDER — OXYCODONE HCL 5 MG PO TABS: 5.0000 mg | ORAL_TABLET | Freq: Once | ORAL | Status: DC | PRN

## 2024-02-18 MED ORDER — METOCLOPRAMIDE HCL 5 MG/ML IJ SOLN: 5.0000 mg | Freq: Three times a day (TID) | INTRAMUSCULAR | Status: DC | PRN

## 2024-02-18 MED ORDER — AMISULPRIDE (ANTIEMETIC) 5 MG/2ML IV SOLN
10.0000 mg | Freq: Once | INTRAVENOUS | Status: AC | PRN
  Administered 2024-02-18: 10 mg via INTRAVENOUS

## 2024-02-18 MED ORDER — ONDANSETRON HCL 4 MG PO TABS: 4.0000 mg | ORAL_TABLET | Freq: Four times a day (QID) | ORAL | Status: DC | PRN

## 2024-02-18 MED ORDER — KETAMINE HCL 50 MG/5ML IJ SOSY
PREFILLED_SYRINGE | INTRAMUSCULAR | Status: AC
  Filled 2024-02-18: qty 5

## 2024-02-18 MED ORDER — MIDAZOLAM HCL 2 MG/2ML IJ SOLN
INTRAMUSCULAR | Status: AC
  Filled 2024-02-18: qty 2

## 2024-02-18 MED ORDER — ADULT MULTIVITAMIN W/MINERALS CH
1.0000 | ORAL_TABLET | Freq: Every day | ORAL | Status: DC
  Administered 2024-02-19 – 2024-02-21 (×3): 1 via ORAL
  Filled 2024-02-18 (×4): qty 1

## 2024-02-18 MED ORDER — METOCLOPRAMIDE HCL 5 MG PO TABS
5.0000 mg | ORAL_TABLET | Freq: Three times a day (TID) | ORAL | Status: DC | PRN
  Filled 2024-02-18: qty 2

## 2024-02-18 MED ORDER — OXYCODONE HCL 5 MG PO TABS
ORAL_TABLET | ORAL | Status: AC
  Filled 2024-02-18: qty 2

## 2024-02-18 MED ORDER — DIPHENHYDRAMINE HCL 12.5 MG/5ML PO ELIX
12.5000 mg | ORAL_SOLUTION | ORAL | Status: DC | PRN
  Administered 2024-02-19 – 2024-02-21 (×11): 25 mg via ORAL
  Filled 2024-02-18 (×11): qty 10

## 2024-02-18 MED ORDER — MIDAZOLAM HCL (PF) 2 MG/2ML IJ SOLN
1.0000 mg | Freq: Once | INTRAMUSCULAR | Status: AC
  Administered 2024-02-18: 1 mg via INTRAVENOUS

## 2024-02-18 MED ORDER — ALUM & MAG HYDROXIDE-SIMETH 200-200-20 MG/5ML PO SUSP
30.0000 mL | ORAL | Status: DC | PRN
  Administered 2024-02-20 (×3): 30 mL via ORAL
  Filled 2024-02-18 (×3): qty 30

## 2024-02-18 MED ORDER — HYDROMORPHONE HCL 1 MG/ML IJ SOLN
0.5000 mg | INTRAMUSCULAR | Status: DC | PRN
  Administered 2024-02-18: 0.5 mg via INTRAVENOUS
  Administered 2024-02-18: 1 mg via INTRAVENOUS
  Administered 2024-02-19 (×2): 0.5 mg via INTRAVENOUS
  Filled 2024-02-18 (×3): qty 1

## 2024-02-18 MED ORDER — PANTOPRAZOLE SODIUM 40 MG PO TBEC
40.0000 mg | DELAYED_RELEASE_TABLET | Freq: Every day | ORAL | Status: DC
  Administered 2024-02-18 – 2024-02-21 (×4): 40 mg via ORAL
  Filled 2024-02-18 (×4): qty 1

## 2024-02-18 MED ORDER — MENTHOL 3 MG MT LOZG: 1.0000 | LOZENGE | OROMUCOSAL | Status: DC | PRN

## 2024-02-18 MED ORDER — ONDANSETRON HCL 4 MG/2ML IJ SOLN
INTRAMUSCULAR | Status: DC | PRN
  Administered 2024-02-18: 4 mg via INTRAVENOUS

## 2024-02-18 MED ORDER — PROPOFOL 500 MG/50ML IV EMUL
INTRAVENOUS | Status: DC | PRN
  Administered 2024-02-18: 40 ug/kg/min via INTRAVENOUS

## 2024-02-18 MED ORDER — ONDANSETRON HCL 4 MG/2ML IJ SOLN: 4.0000 mg | Freq: Four times a day (QID) | INTRAMUSCULAR | Status: DC | PRN

## 2024-02-18 MED ORDER — ORAL CARE MOUTH RINSE: 15.0000 mL | Freq: Once | OROMUCOSAL | Status: AC

## 2024-02-18 MED ORDER — SODIUM CHLORIDE 0.9 % IR SOLN
Status: DC | PRN
  Administered 2024-02-18: 1000 mL

## 2024-02-18 MED ORDER — TRIAMTERENE-HCTZ 37.5-25 MG PO TABS
0.5000 | ORAL_TABLET | Freq: Every day | ORAL | Status: DC
  Administered 2024-02-19 – 2024-02-21 (×3): 0.5 via ORAL
  Filled 2024-02-18 (×4): qty 0.5

## 2024-02-18 SURGICAL SUPPLY — 42 items
BAG COUNTER SPONGE SURGICOUNT (BAG) ×1 IMPLANT
BENZOIN TINCTURE PRP APPL 2/3 (GAUZE/BANDAGES/DRESSINGS) ×1 IMPLANT
BLADE SAW SGTL 18X1.27X75 (BLADE) ×1 IMPLANT
COVER SURGICAL LIGHT HANDLE (MISCELLANEOUS) ×1 IMPLANT
CUP SECTOR GRIPTON 50MM (Cup) IMPLANT
DRAPE C-ARM 42X72 X-RAY (DRAPES) ×1 IMPLANT
DRAPE STERI IOBAN 125X83 (DRAPES) ×1 IMPLANT
DRAPE U-SHAPE 47X51 STRL (DRAPES) ×3 IMPLANT
DRSG AQUACEL AG ADV 3.5X10 (GAUZE/BANDAGES/DRESSINGS) ×1 IMPLANT
DURAPREP 26ML APPLICATOR (WOUND CARE) ×1 IMPLANT
ELECT BLADE 6.5 EXT (BLADE) IMPLANT
ELECTRODE BLDE 4.0 EZ CLN MEGD (MISCELLANEOUS) ×1 IMPLANT
ELECTRODE REM PT RTRN 9FT ADLT (ELECTROSURGICAL) ×1 IMPLANT
FACESHIELD WRAPAROUND OR TEAM (MASK) ×2 IMPLANT
FEMORAL STEM 12/14 TPR SZ4 HIP (Orthopedic Implant) IMPLANT
GLOVE BIOGEL PI IND STRL 8 (GLOVE) ×2 IMPLANT
GLOVE ECLIPSE 8.0 STRL XLNG CF (GLOVE) ×1 IMPLANT
GLOVE ORTHO TXT STRL SZ7.5 (GLOVE) ×2 IMPLANT
GOWN STRL REUS W/ TWL LRG LVL3 (GOWN DISPOSABLE) ×2 IMPLANT
GOWN STRL REUS W/ TWL XL LVL3 (GOWN DISPOSABLE) ×2 IMPLANT
HEAD FEMORAL 32 CERAMIC (Hips) IMPLANT
KIT BASIN OR (CUSTOM PROCEDURE TRAY) ×1 IMPLANT
KIT TURNOVER KIT B (KITS) ×1 IMPLANT
LINER ACETABULAR 32X50 (Liner) IMPLANT
MANIFOLD NEPTUNE II (INSTRUMENTS) ×1 IMPLANT
PACK TOTAL JOINT (CUSTOM PROCEDURE TRAY) ×1 IMPLANT
PAD ARMBOARD POSITIONER FOAM (MISCELLANEOUS) ×1 IMPLANT
PAD COLD SHLDR WRAP-ON (PAD) IMPLANT
PENCIL BUTTON HOLSTER BLD 10FT (ELECTRODE) IMPLANT
SET HNDPC FAN SPRY TIP SCT (DISPOSABLE) ×1 IMPLANT
SOLN 0.9% NACL POUR BTL 1000ML (IV SOLUTION) ×1 IMPLANT
SOLN STERILE WATER BTL 1000 ML (IV SOLUTION) ×2 IMPLANT
STAPLER SKIN PROX 35W (STAPLE) IMPLANT
STRIP CLOSURE SKIN 1/2X4 (GAUZE/BANDAGES/DRESSINGS) ×2 IMPLANT
SUT ETHIBOND NAB CT1 #1 30IN (SUTURE) ×1 IMPLANT
SUT MNCRL AB 4-0 PS2 18 (SUTURE) IMPLANT
SUT VIC AB 0 CT1 27XBRD ANBCTR (SUTURE) ×1 IMPLANT
SUT VIC AB 1 CT1 27XBRD ANBCTR (SUTURE) ×1 IMPLANT
SUT VIC AB 2-0 CT1 TAPERPNT 27 (SUTURE) ×1 IMPLANT
TOWEL GREEN STERILE (TOWEL DISPOSABLE) ×1 IMPLANT
TOWEL GREEN STERILE FF (TOWEL DISPOSABLE) ×1 IMPLANT
TRAY FOLEY W/BAG SLVR 16FR ST (SET/KITS/TRAYS/PACK) IMPLANT

## 2024-02-18 NOTE — Op Note (Signed)
 Operative Note  Date of operation: 02/18/2024 Preoperative diagnosis: Left hip primary osteoarthritis Postoperative diagnosis: Same  Procedure: Left direct anterior total hip arthroplasty  Implants: Implant Name Type Inv. Item Serial No. Manufacturer Lot No. LRB No. Used Action  CUP SECTOR GRIPTON - [id number] Cup CUP SECTOR GRIPTON  [employer]PU[employer]TH[employer] [id number] Left 1 Implanted  LINER ACETABULAR 32X50 - [id number] Liner LINER ACETABULAR 32X50  DEPUY ORTHOPAEDICS [id number] Left 1 Implanted  FEMORAL STEM 12/14 TPR SZ4 HIP - [id number] Orthopedic Implant FEMORAL STEM 12/14 TPR SZ4 HIP  DEPUY ORTHOPAEDICS [id number] Left 1 Implanted  HEAD FEMORAL 32 CERAMIC - [id number] Hips HEAD FEMORAL 32 CERAMIC  DEPUY ORTHOPAED[employer] [id number] Left 1 Implanted   Surgeon: Lonni GRADE. Vernetta, MD Assistant: Almeda Rummer, PA-C  Anesthesia: Spinal EBL: 300 cc Antibiotics: IV Ancef  Complications: None  Indications: The patient is a 58 year old female with significant left hip pain and arthritis verified by MRI studies.  She has tried and failed all forms conservative treatment.  At this point her left hip pain is daily and it has detrimentally affected her mobility, her quality of life and her actives day living.  We actually replaced her right hip due to similar reasons back in 2021 that has done well.  Having had hip replacement surgery before she is fully aware of the risks of acute blood loss anemia, nerve and vessel injury, fracture, infection, DVT, implant failure, dislocation, leg length differences and wound healing issues.  She understands that our goals are hopefully decreased pain, improved mobility and improve quality of life.  Procedure description: After informed consent was obtained and the appropriate left hip was marked, the patient was brought to the operating room and set up on the stretcher where spinal anesthesia was obtained.  She was then laid in spine position on stretcher and a  Foley catheter was placed.  Traction boots were placed on both her feet and next she was placed supine on the Hana fracture table with a perineal post and placed in both legs in inline skeletal traction devices no traction applied.  Her left operative hip and pelvis were assessed radiographically.  The left hip was prepped and draped with DuraPrep and sterile drapes.  A timeout was called and she was identified as correct patient to correct the left hip.  An incision was then made just inferior and posterior to the ASIS and carried slightly obliquely down the leg.  Dissection was carried down to the tensor fascia lata muscle and the tensor fascia was then divided longitudinally to proceed with a direct anterior approach to the hip.  Circumflex vessels were identified and cauterized.  The hip capsule was identified and opened up in L-type format.  Cobra retractors were placed around the medial and lateral femoral neck and a femoral neck cut was made with an oscillating saw just proximal to the lesser trochanter and this cut was completed with an osteotome.  A corkscrew guide was placed in the femoral head and the femoral head was removed in its entirety and there was an area of cartilage loss right at the weightbearing surface of the femoral head and acetabulum.  A bent Hohmann was placed over the medial acetabular rim and remanence of the acetabular labrum and other debris removed.  Reaming was then initiated from a size 43 reamer and stepwise increments going up to a size 49 reamer with all reamers placed under direct visualization and the last reamer also placed under direct fluoroscopy in order to obtain the  depth reaming, the inclination and the anteversion.  The real DePuy sector GRIPTION acetabular opponent size 50 was then placed without difficulty followed by 32+0 polythene liner.  Attention was then turned to the femur.  With the left leg externally rotated to 120 degrees, extended and adducted, a Mueller  retractor was placed medially and a Hohmann tractor by the greater trochanter.  The lateral capsule was released and a box cutting osteotome was used into the femoral canal.  Broaching was then initiated using the Actis broaching system from a size 0 going up to a size 4.  With a size 4 placed we trialed a standard offset femoral neck and a 32+1 trial head ball.  The left leg was brought over and up and with traction and internal rotation reduced in the pelvis.  Based on radiographic and clinical assessment we felt like her leg lengths were close but we needed more offset.  We dislocated the hip and remove the trial components.  We then placed the real Actis femoral component with high offset size 4 and went with the real 32+1 ceramic head ball.  Again this reduced the pelvis and we are pleased with that mechanically and radiographically assessing it clinically.  We then irrigated the soft tissue normal saline solution using pulsatile lavage.  The joint capsule was closed interrupted #1 Ethibond suture followed by #1 Vicryl to close the tensor fascia.  0 Vicryl used to close the deep tissue and 2-0 Vicryl was used to close the subcutaneous tissue.  A 4-0 Monocryl subcuticular stitch was applied as well as Steri-Strips and an Aquacel dressing.  The patient was taken off the Hana table and taken recovery room.  Almeda Rummer, PA-C did assist during the entire case from beginning to end and her assistance was critical and medically necessary for soft tissue management and retraction, helping guide implant placement and a layered closure of the wound.

## 2024-02-18 NOTE — Anesthesia Postprocedure Evaluation (Signed)
 Anesthesia Post Note  Patient: LYRIQUE HAKIM  Procedure(s) Performed: ARTHROPLASTY, HIP, TOTAL, ANTERIOR APPROACH (Left: Hip)     Patient location during evaluation: PACU Anesthesia Type: MAC and Spinal Level of consciousness: awake Pain management: pain level controlled Vital Signs Assessment: post-procedure vital signs reviewed and stable Respiratory status: spontaneous breathing, respiratory function stable and nonlabored ventilation Cardiovascular status: blood pressure returned to baseline and stable Postop Assessment: no headache, no backache and no apparent nausea or vomiting Anesthetic complications: no   No notable events documented.  Last Vitals:  Vitals:   02/18/24 0945 02/18/24 1000  BP: 123/84 128/84  Pulse: 82 82  Resp: 16 14  Temp:    SpO2: 96% 96%    Last Pain:  Vitals:   02/18/24 0945  TempSrc:   PainSc: 10-Worst pain ever                 Delon Aisha Arch

## 2024-02-18 NOTE — Progress Notes (Signed)
 PT Cancellation Note  Patient Details Name: Kathy Nguyen MRN: [id number] DOB: 25-Mar-1966   Cancelled Treatment:    Reason Eval/Treat Not Completed: Patient declined, no reason specified. Pt reports continued significant nausea since arousing after surgery. Pt declines attempts at mobilizing, prefers PT return tomorrow. PT will follow up as time allows.   Bernardino JINNY Ruth 02/18/2024, 2:39 PM

## 2024-02-18 NOTE — Interval H&P Note (Signed)
 History and Physical Interval Note: The patient understands that she is here today for a left total hip replacement to treat her significant left hip pain and arthritis.  There has been no acute or interval change in her medical status.  The risks and benefits of surgery have been discussed in detail and informed consent has been obtained.  The left operative hip has been marked.  02/18/2024 7:15 AM  Kathy Nguyen  has presented today for surgery, with the diagnosis of Osteoarthritis Left Hip.  The various methods of treatment have been discussed with the patient and family. After consideration of risks, benefits and other options for treatment, the patient has consented to  Procedure(s): ARTHROPLASTY, HIP, TOTAL, ANTERIOR APPROACH (Left) as a surgical intervention.  The patient's history has been reviewed, patient examined, no change in status, stable for surgery.  I have reviewed the patient's chart and labs.  Questions were answered to the patient's satisfaction.     Kathy Nguyen

## 2024-02-18 NOTE — Transfer of Care (Signed)
 Immediate Anesthesia Transfer of Care Note  Patient: Kathy Nguyen  Procedure(s) Performed: ARTHROPLASTY, HIP, TOTAL, ANTERIOR APPROACH (Left: Hip)  Patient Location: PACU  Anesthesia Type:MAC and Spinal  Level of Consciousness: drowsy and responds to stimulation  Airway & Oxygen Therapy: Patient Spontanous Breathing  Post-op Assessment: Report given to RN and Post -op Vital signs reviewed and stable  Post vital signs: Reviewed and stable  Last Vitals:  Vitals Value Taken Time  BP 112/77 02/18/24 09:15  Temp    Pulse 80 02/18/24 09:18  Resp 14 02/18/24 09:18  SpO2 98 % 02/18/24 09:18  Vitals shown include unfiled device data.  Last Pain:  Vitals:   02/18/24 0630  TempSrc:   PainSc: 7          Complications: No notable events documented.

## 2024-02-18 NOTE — Discharge Instructions (Signed)
 Per [facility] clinic policy, our goal is ensure optimal postoperative pain control with a multimodal pain management strategy. For all [facility] patients, our goal is to wean post-operative narcotic medications by 6 weeks post-operatively. If this is not possible due to utilization of pain medication prior to surgery, your [facility] doctor will support your acute post-operative pain control for the first 6 weeks postoperatively, with a plan to transition you back to your primary pain team following that. Kathy Nguyen will work to ensure a Therapist, occupational.  INSTRUCTIONS AFTER JOINT REPLACEMENT   Remove items at home which could result in a fall. This includes throw rugs or furniture in walking pathways ICE to the affected joint every three hours while awake for 30 minutes at a time, for at least the first 3-5 days, and then as needed for pain and swelling.  Continue to use ice for pain and swelling. You may notice swelling that will progress down to the foot and ankle.  This is normal after surgery.  Elevate your leg when you are not up walking on it.   Continue to use the breathing machine you got in the hospital (incentive spirometer) which will help keep your temperature down.  It is common for your temperature to cycle up and down following surgery, especially at night when you are not up moving around and exerting yourself.  The breathing machine keeps your lungs expanded and your temperature down.   DIET:  As you were doing prior to hospitalization, we recommend a well-balanced diet.  DRESSING / WOUND CARE / SHOWERING  Keep the surgical dressing until follow up.  The dressing is water  proof, so you can shower without any extra covering.  IF THE DRESSING FALLS OFF or the wound gets wet inside, change the dressing with sterile gauze.  Please use good hand washing techniques before changing the dressing.  Do not use any lotions or creams on the incision until instructed by your surgeon.     ACTIVITY  Increase activity slowly as tolerated, but follow the weight bearing instructions below.   No driving for 6 weeks or until further direction given by your physician.  You cannot drive while taking narcotics.  No lifting or carrying greater than 10 lbs. until further directed by your surgeon. Avoid periods of inactivity such as sitting longer than an hour when not asleep. This helps prevent blood clots.  You may return to work once you are authorized by your doctor.     WEIGHT BEARING   Weight bearing as tolerated with assist device (walker, cane, etc) as directed, use it as long as suggested by your surgeon or therapist, typically at least 4-6 weeks.   EXERCISES  Results after joint replacement surgery are often greatly improved when you follow the exercise, range of motion and muscle strengthening exercises prescribed by your doctor. Safety measures are also important to protect the joint from further injury. Any time any of these exercises cause you to have increased pain or swelling, decrease what you are doing until you are comfortable again and then slowly increase them. If you have problems or questions, call your caregiver or physical therapist for advice.   Rehabilitation is important following a joint replacement. After just a few days of immobilization, the muscles of the leg can become weakened and shrink (atrophy).  These exercises are designed to build up the tone and strength of the thigh and leg muscles and to improve motion. Often times heat used for twenty to thirty minutes before  working out will loosen up your tissues and help with improving the range of motion but do not use heat for the first two weeks following surgery (sometimes heat can increase post-operative swelling).   These exercises can be done on a training (exercise) mat, on the floor, on a table or on a bed. Use whatever works the best and is most comfortable for you.    Use music or television  while you are exercising so that the exercises are a pleasant break in your day. This will make your life better with the exercises acting as a break in your routine that you can look forward to.   Perform all exercises about fifteen times, three times per day or as directed.  You should exercise both the operative leg and the other leg as well.  Exercises include:   Quad Sets - Tighten up the muscle on the front of the thigh (Quad) and hold for 5-10 seconds.   Straight Leg Raises - With your knee straight (if you were given a brace, keep it on), lift the leg to 60 degrees, hold for 3 seconds, and slowly lower the leg.  Perform this exercise against resistance later as your leg gets stronger.  Leg Slides: Lying on your back, slowly slide your foot toward your buttocks, bending your knee up off the floor (only go as far as is comfortable). Then slowly slide your foot back down until your leg is flat on the floor again.  Angel Wings: Lying on your back spread your legs to the side as far apart as you can without causing discomfort.  Hamstring Strength:  Lying on your back, push your heel against the floor with your leg straight by tightening up the muscles of your buttocks.  Repeat, but this time bend your knee to a comfortable angle, and push your heel against the floor.  You may put a pillow under the heel to make it more comfortable if necessary.   A rehabilitation program following joint replacement surgery can speed recovery and prevent re-injury in the future due to weakened muscles. Contact your doctor or a physical therapist for more information on knee rehabilitation.    CONSTIPATION  Constipation is defined medically as fewer than three stools per week and severe constipation as less than one stool per week.  Even if you have a regular bowel pattern at home, your normal regimen is likely to be disrupted due to multiple reasons following surgery.  Combination of anesthesia, postoperative  narcotics, change in appetite and fluid intake all can affect your bowels.   YOU MUST use at least one of the following options; they are listed in order of increasing strength to get the job done.  They are all available over the counter, and you may need to use some, POSSIBLY even all of these options:    Drink plenty of fluids (prune juice may be helpful) and high fiber foods Colace 100 mg by mouth twice a day  Senokot for constipation as directed and as needed Dulcolax (bisacodyl), take with full glass of water  Miralax (polyethylene glycol) once or twice a day as needed.  If you have tried all these things and are unable to have a bowel movement in the first 3-4 days after surgery call either your surgeon or your primary doctor.    If you experience loose stools or diarrhea, hold the medications until you stool forms back up.  If your symptoms do not get better within 1 week  or if they get worse, check with your doctor.  If you experience the worst abdominal pain ever or develop nausea or vomiting, please contact the office immediately for further recommendations for treatment.   ITCHING:  If you experience itching with your medications, try taking only a single pain pill, or even half a pain pill at a time.  You can also use Benadryl  over the counter for itching or also to help with sleep.   TED HOSE STOCKINGS:  Use stockings on both legs until for at least 2 weeks or as directed by physician office. They may be removed at night for sleeping.  MEDICATIONS:  See your medication summary on the "After Visit Summary" that nursing will review with you.  You may have some home medications which will be placed on hold until you complete the course of blood thinner medication.  It is important for you to complete the blood thinner medication as prescribed.  PRECAUTIONS:  If you experience chest pain or shortness of breath - call 911 immediately for transfer to the hospital emergency department.    If you develop a fever greater that 101 F, purulent drainage from wound, increased redness or drainage from wound, foul odor from the wound/dressing, or calf pain - CONTACT YOUR SURGEON.                                                   FOLLOW-UP APPOINTMENTS:  If you do not already have a post-op appointment, please call the office for an appointment to be seen by your surgeon.  Guidelines for how soon to be seen are listed in your "After Visit Summary", but are typically between 1-4 weeks after surgery.  OTHER INSTRUCTIONS:   Knee Replacement:  Do not place pillow under knee, focus on keeping the knee straight while resting. CPM instructions: 0-90 degrees, 2 hours in the morning, 2 hours in the afternoon, and 2 hours in the evening. Place foam block, curve side up under heel at all times except when in CPM or when walking.  DO NOT modify, tear, cut, or change the foam block in any way.  POST-OPERATIVE OPIOID TAPER INSTRUCTIONS: It is important to wean off of your opioid medication as soon as possible. If you do not need pain medication after your surgery it is ok to stop day one. Opioids include: Codeine, Hydrocodone(Norco, Vicodin), Oxycodone (Percocet, oxycontin ) and hydromorphone  amongst others.  Long term and even short term use of opiods can cause: Increased pain response Dependence Constipation Depression Respiratory depression And more.  Withdrawal symptoms can include Flu like symptoms Nausea, vomiting And more Techniques to manage these symptoms Hydrate well Eat regular healthy meals Stay active Use relaxation techniques(deep breathing, meditating, yoga) Do Not substitute Alcohol to help with tapering If you have been on opioids for less than two weeks and do not have pain than it is ok to stop all together.  Plan to wean off of opioids This plan should start within one week post op of your joint replacement. Maintain the same interval or time between taking each dose  and first decrease the dose.  Cut the total daily intake of opioids by one tablet each day Next start to increase the time between doses. The last dose that should be eliminated is the evening dose.   MAKE SURE YOU:  Understand these instructions.  Get help right away if you are not doing well or get worse.    Thank you for letting us  be a part of your medical care team.  It is a privilege we respect greatly.  We hope these instructions will help you stay on track for a fast and full recovery!     Dental Antibiotics:  In most cases prophylactic antibiotics for Dental procdeures after total joint surgery are not necessary.  Exceptions are as follows:  1. History of prior total joint infection  2. Severely immunocompromised (Organ Transplant, cancer chemotherapy, Rheumatoid biologic meds such as Humera)  3. Poorly controlled diabetes (A1C &gt; 8.0, blood glucose over 200)  If you have one of these conditions, contact your surgeon for an antibiotic prescription, prior to your dental procedure.

## 2024-02-18 NOTE — Anesthesia Procedure Notes (Signed)
 Spinal  Patient location during procedure: OR Start time: 02/18/2024 7:33 AM End time: 02/18/2024 7:37 AM Reason for block: surgical anesthesia Staffing Performed: anesthesiologist  Anesthesiologist: Peggye Delon Brunswick, MD Performed by: Peggye Delon Brunswick, MD Authorized by: Peggye Delon Brunswick, MD   Preanesthetic Checklist Completed: patient identified, IV checked, site marked, risks and benefits discussed, surgical consent, monitors and equipment checked, pre-op evaluation and timeout performed Spinal Block Patient position: sitting Prep: DuraPrep Patient monitoring: blood pressure and continuous pulse ox Approach: midline Location: L3-4 Injection technique: single-shot Needle Needle type: Pencan  Needle gauge: 24 G Needle length: 9 cm Additional Notes Risks and benefits of neuraxial anesthesia including, but not limited to, infection, bleeding, local anesthetic toxicity, headache, hypotension, back pain, block failure, etc. were discussed with the patient. The patient expressed understanding and consented to the procedure. I confirmed that the patient has no bleeding disorders and is not taking blood thinners. I confirmed the patient's last platelet count with the nurse. Monitors were applied. A time-out was performed immediately prior to the procedure. Sterile technique was used throughout the whole procedure.   1 attempt(s)

## 2024-02-19 ENCOUNTER — Encounter (HOSPITAL_COMMUNITY): Payer: Self-pay | Admitting: Orthopaedic Surgery

## 2024-02-19 DIAGNOSIS — Z90710 Acquired absence of both cervix and uterus: Secondary | ICD-10-CM | POA: Diagnosis not present

## 2024-02-19 DIAGNOSIS — Z881 Allergy status to other antibiotic agents status: Secondary | ICD-10-CM | POA: Diagnosis not present

## 2024-02-19 DIAGNOSIS — Z806 Family history of leukemia: Secondary | ICD-10-CM | POA: Diagnosis not present

## 2024-02-19 DIAGNOSIS — Z8701 Personal history of pneumonia (recurrent): Secondary | ICD-10-CM | POA: Diagnosis not present

## 2024-02-19 DIAGNOSIS — Z8261 Family history of arthritis: Secondary | ICD-10-CM | POA: Diagnosis not present

## 2024-02-19 DIAGNOSIS — M1612 Unilateral primary osteoarthritis, left hip: Secondary | ICD-10-CM | POA: Diagnosis present

## 2024-02-19 DIAGNOSIS — Z96641 Presence of right artificial hip joint: Secondary | ICD-10-CM | POA: Diagnosis present

## 2024-02-19 DIAGNOSIS — Z880 Allergy status to penicillin: Secondary | ICD-10-CM | POA: Diagnosis not present

## 2024-02-19 DIAGNOSIS — Z9181 History of falling: Secondary | ICD-10-CM | POA: Diagnosis not present

## 2024-02-19 DIAGNOSIS — K219 Gastro-esophageal reflux disease without esophagitis: Secondary | ICD-10-CM | POA: Diagnosis present

## 2024-02-19 DIAGNOSIS — Z79899 Other long term (current) drug therapy: Secondary | ICD-10-CM | POA: Diagnosis not present

## 2024-02-19 DIAGNOSIS — I10 Essential (primary) hypertension: Secondary | ICD-10-CM | POA: Diagnosis present

## 2024-02-19 DIAGNOSIS — Z8249 Family history of ischemic heart disease and other diseases of the circulatory system: Secondary | ICD-10-CM | POA: Diagnosis not present

## 2024-02-19 DIAGNOSIS — Z7985 Long-term (current) use of injectable non-insulin antidiabetic drugs: Secondary | ICD-10-CM | POA: Diagnosis not present

## 2024-02-19 LAB — CBC
HCT: 29.7 % — ABNORMAL LOW (ref 36.0–46.0)
Hemoglobin: 10 g/dL — ABNORMAL LOW (ref 12.0–15.0)
MCH: 29.2 pg (ref 26.0–34.0)
MCHC: 33.7 g/dL (ref 30.0–36.0)
MCV: 86.8 fL (ref 80.0–100.0)
Platelets: 258 K/uL (ref 150–400)
RBC: 3.42 MIL/uL — ABNORMAL LOW (ref 3.87–5.11)
RDW: 12.6 % (ref 11.5–15.5)
WBC: 10.5 K/uL (ref 4.0–10.5)
nRBC: 0 % (ref 0.0–0.2)

## 2024-02-19 LAB — BASIC METABOLIC PANEL WITH GFR
Anion gap: 11 (ref 5–15)
BUN: 8 mg/dL (ref 6–20)
CO2: 23 mmol/L (ref 22–32)
Calcium: 8.3 mg/dL — ABNORMAL LOW (ref 8.9–10.3)
Chloride: 102 mmol/L (ref 98–111)
Creatinine, Ser: 0.91 mg/dL (ref 0.44–1.00)
GFR, Estimated: >60 mL/min (ref >60)
Glucose, Bld: 116 mg/dL — ABNORMAL HIGH (ref 70–99)
Potassium: 3.7 mmol/L (ref 3.5–5.1)
Sodium: 136 mmol/L (ref 135–145)

## 2024-02-19 MED ORDER — HYDROCORTISONE 1 % EX CREA
TOPICAL_CREAM | Freq: Two times a day (BID) | CUTANEOUS | Status: DC | PRN
  Filled 2024-02-19: qty 28

## 2024-02-19 MED ORDER — MORPHINE SULFATE (PF) 2 MG/ML IV SOLN
1.0000 mg | INTRAVENOUS | Status: DC | PRN
  Administered 2024-02-19 – 2024-02-20 (×5): 2 mg via INTRAVENOUS
  Administered 2024-02-20: 1 mg via INTRAVENOUS
  Administered 2024-02-21: 2 mg via INTRAVENOUS
  Filled 2024-02-19 (×7): qty 1

## 2024-02-19 MED ORDER — CHLORHEXIDINE GLUCONATE CLOTH 2 % EX PADS: 6.0000 | MEDICATED_PAD | Freq: Every day | CUTANEOUS | Status: DC

## 2024-02-19 MED ORDER — SODIUM CHLORIDE 0.9 % IV BOLUS
500.0000 mL | Freq: Once | INTRAVENOUS | Status: AC
  Administered 2024-02-19: 500 mL via INTRAVENOUS

## 2024-02-19 MED ORDER — KETOROLAC TROMETHAMINE 15 MG/ML IJ SOLN
7.5000 mg | Freq: Four times a day (QID) | INTRAMUSCULAR | Status: AC
  Administered 2024-02-19 – 2024-02-20 (×3): 7.5 mg via INTRAVENOUS
  Filled 2024-02-19 (×3): qty 1

## 2024-02-19 NOTE — Progress Notes (Signed)
 10:41 Patient reports severe itching to back and chest. Red, diffuse non-raised rash noted on chest and face. PRN benadryl  given without relief. Notified Vernetta, MD. Received orders for hydrocortisone cream.   15:38 Patient orthostatic when working with PT. Patient requested to keep foley in place until she is able to tolerate standing/pivot to [facility]. OK to delay removing foley until tomorrow per Vernetta, MD

## 2024-02-19 NOTE — Progress Notes (Signed)
 Physical Therapy Treatment Patient Details Name: Kathy Nguyen MRN: [id number] DOB: [birth year] Today's Date: 02/19/2024   History of Present Illness 58 y.o. female presented to [facility] 11/11 for elective L THA. 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    PT Comments  Pt is limited by orthostatic BP and L hip pain. Pt's BP drops from 124/84 in supine to 71/64 after ambulating for a few steps this session, pt is symptomatic with reports of nausea. Pt continues to require significant assistance to advance LLE, PT provides reinforcement of HEP to aide in activating LLE musculature. Pt left in chair position in the bed to reacquaint body to upright position. PT will follow up tomorrow.    If plan is discharge home, recommend the following: Assistance with cooking/housework;Assist for transportation;Help with stairs or ramp for entrance;A lot of help with walking and/or transfers;A lot of help with bathing/dressing/bathroom   Can travel by private vehicle        Equipment Recommendations  Rolling walker (2 wheels);BSC/3in1    Recommendations for Other Services       Precautions / Restrictions Precautions Precautions: Fall Recall of Precautions/Restrictions: Intact Precaution/Restrictions Comments: Direct anterior approach, no hip precautions Restrictions Weight Bearing Restrictions Per Provider Order: Yes LLE Weight Bearing Per Provider Order: Weight bearing as tolerated     Mobility  Bed Mobility Overal bed mobility: Needs Assistance Bed Mobility: Supine to Sit, Sit to Supine     Supine to sit: Mod assist, HOB elevated, Used rails Sit to supine: Min assist        Transfers Overall transfer level: Needs assistance Equipment used: Rolling walker (2 wheels) Transfers: Sit to/from Stand Sit to Stand: Contact guard assist                Ambulation/Gait Ambulation/Gait assistance: Contact guard assist Gait Distance (Feet): 3 Feet Assistive device:  Rolling walker (2 wheels) Gait Pattern/deviations: Step-to pattern, Decreased stance time - left Gait velocity: reduced Gait velocity interpretation: <1.31 ft/sec, indicative of household ambulator   General Gait Details: pt with short step-to gait, reduced stance time on LLE along with reduced foot clearance   Stairs             Wheelchair Mobility     Tilt Bed    Modified Rankin (Stroke Patients Only)       Balance Overall balance assessment: Needs assistance Sitting-balance support: No upper extremity supported, Feet supported Sitting balance-Leahy Scale: Fair     Standing balance support: Bilateral upper extremity supported, Reliant on assistive device for balance Standing balance-Leahy Scale: Poor                              Communication Communication Communication: No apparent difficulties  Cognition Arousal: Alert Behavior During Therapy: Anxious   PT - Cognitive impairments: No apparent impairments                         Following commands: Intact      Cueing Cueing Techniques: Verbal cues  Exercises Other Exercises Other Exercises: PT reinforces HEP, requests pt perform ankle pumps, heel slides, quad sets and hip abduction overnight    General Comments General comments (skin integrity, edema, etc.): pt remains orthostatic when standing, vitals in vitals flowsheet      Pertinent Vitals/Pain Pain Assessment Pain Assessment: 0-10 Pain Score: 8  Pain Location: L hip Pain Descriptors / Indicators:  Sharp Pain Intervention(s): Monitored during session    Home Living Family/patient expects to be discharged to:: Private residence Living Arrangements: Spouse/significant other;Other relatives Available Help at Discharge: Family;Available PRN/intermittently Type of Home: House Home Access: Stairs to enter                Prior Function            PT Goals (current goals can now be found in the care plan section)  Acute Rehab PT Goals Patient Stated Goal: Return Home and regain independence Progress towards PT goals: Not progressing toward goals - comment (remains limited by pain and orthostatic BP)    Frequency    7X/week      PT Plan      Co-evaluation              AM-PAC PT 6 Clicks Mobility   Outcome Measure  Help needed turning from your back to your side while in a flat bed without using bedrails?: A Little Help needed moving from lying on your back to sitting on the side of a flat bed without using bedrails?: A Lot Help needed moving to and from a bed to a chair (including a wheelchair)?: A Little Help needed standing up from a chair using your arms (e.g., wheelchair or bedside chair)?: A Little Help needed to walk in hospital room?: A Lot Help needed climbing 3-5 steps with a railing? : Total 6 Click Score: 14    End of Session Equipment Utilized During Treatment: Gait belt Activity Tolerance: Patient limited by pain;Treatment limited secondary to medical complications (Comment) (orthostatic hypotension) Patient left: in bed;with call bell/phone within reach Nurse Communication: Mobility status PT Visit Diagnosis: Difficulty in walking, not elsewhere classified (R26.2);Muscle weakness (generalized) (M62.81);Other abnormalities of gait and mobility (R26.89);Unsteadiness on feet (R26.81)     Time: 8570-8492 PT Time Calculation (min) (ACUTE ONLY): 38 min  Charges:    $Gait Training: 8-22 mins $Therapeutic Activity: 23-37 mins PT General Charges $$ ACUTE PT VISIT: 1 Visit                     Bernardino JINNY Ruth, PT, DPT Acute Rehabilitation Office [phone]    Bernardino JINNY Ruth 02/19/2024, 3:21 PM

## 2024-02-19 NOTE — Care Management Obs Status (Signed)
 MEDICARE OBSERVATION STATUS NOTIFICATION   Patient Details  Name: Kathy Nguyen MRN: [id number] Date of Birth: Jun 04, 1965   Medicare Observation Status Notification Given:  Yes    Bridget Cordella Simmonds, LCSW 02/19/2024, 12:44 PM

## 2024-02-19 NOTE — Evaluation (Signed)
 Physical Therapy Evaluation Patient Details Name: Kathy Nguyen TECH MRN: [id number] DOB: 10/04/1965 Today's Date: 02/19/2024  History of Present Illness  58 y.o. female presented to [facility] 11/11 for elective L THA. 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  Clinical Impression  Pt admitted with above diagnosis. PTA, pt was independent for functional mobility and ADLs. She reports difficulty bending over and required assistance with some household chores. Pt lives with her partner and granddaughter in a one story ranch-style home with 2 STE and 2 steps into/out of the sunken living room. Pt currently with functional limitations due to the deficits listed below (see PT Problem List). She required minA for bed mobility and modA for sit<>stand using RW. Pt is currently limited by anxiety, L hip pain, symptomatic orthostatic hypotension, impaired balance, and decreased activity tolerance. Cued PLB technique throughout session d/t pt's tendency to hold her breath with mobility. She c/o dizziness, lightheadedness, and nausea upon standing up and maintained static stance for <30 seconds. Assessed vitals upon sitting EOB and pt's BP 87/62 (70), HR 117. Upon returning to bed pt's BP 111/67 (80), HR 115. Educated pt on L THA HEP and provided handout. Pt will benefit from acute skilled PT to increase her independence and safety with mobility to allow discharge. Will continue to advance ambulation and practice stairs in future sessions.     If plan is discharge home, recommend the following: A little help with walking and/or transfers;A little help with bathing/dressing/bathroom;Assistance with cooking/housework;Assist for transportation;Help with stairs or ramp for entrance   Can travel by private vehicle        Equipment Recommendations Rolling walker (2 wheels)  Recommendations for Other Services       Functional Status Assessment Patient has had a recent decline in their functional  status and demonstrates the ability to make significant improvements in function in a reasonable and predictable amount of time.     Precautions / Restrictions Precautions Precautions: Fall Recall of Precautions/Restrictions: Intact Precaution/Restrictions Comments: Direct anterior approach, no hip precautions Restrictions Weight Bearing Restrictions Per Provider Order: Yes      Mobility  Bed Mobility Overal bed mobility: Needs Assistance Bed Mobility: Supine to Sit, Sit to Supine     Supine to sit: Min assist, HOB elevated, Used rails Sit to supine: Min assist, HOB elevated, Used rails   General bed mobility comments: Pt sat up on R side of bed with increased time. Cues for sequencing. Assist to manage LLE. Pt required LLE to rest on bed between slight advancements d/t pain. Cued PLB technique throughout. Pt utilized bed rail and was able to pull in order to bring trunk upright and scoot hips fwd til feet flat. Returning to bed pt controlled trunk down, assist to manage LLE. She repositioned herself in the center and top of bed by scooting and pulling on rails.    Transfers Overall transfer level: Needs assistance Equipment used: Rolling walker (2 wheels) Transfers: Sit to/from Stand Sit to Stand: Mod assist           General transfer comment: Introduced RW and educated pt on proper and safe use of AD. Cued proper hand/foot placement and seqeuncing using RW. Educated pt on use of momentum and increased fwd lean. Powered up with modA. VC/TC to increased upright posture, hip ext, and knee ext. Pt maintained static stance for <30 sec, c/o lightheadedness and dizziness.    Ambulation/Gait  General Gait Details: Deferred d/t symptomatic orthostatic hypotension.  Stairs            Wheelchair Mobility     Tilt Bed    Modified Rankin (Stroke Patients Only)       Balance Overall balance assessment: Needs assistance Sitting-balance support:  Bilateral upper extremity supported, Feet supported Sitting balance-Leahy Scale: Fair Sitting balance - Comments: Pt sat EOB with close supervision.   Standing balance support: Bilateral upper extremity supported, During functional activity, Reliant on assistive device for balance Standing balance-Leahy Scale: Poor Standing balance comment: Pt dependent on RW and external support of PT.                             Pertinent Vitals/Pain Pain Assessment Pain Assessment: 0-10 Pain Score: 8  Pain Location: L hip Pain Descriptors / Indicators: Operative site guarding, Grimacing, Moaning Pain Intervention(s): Premedicated before session, Monitored during session, Limited activity within patient's tolerance, Repositioned, Ice applied    Home Living Family/patient expects to be discharged to:: Private residence Living Arrangements: Spouse/significant other;Other relatives (85 y.o. granddaughter who has high functioning autism) Available Help at Discharge: Family;Available PRN/intermittently (Partner works. Granddaughter is attending online school.) Type of Home: House Home Access: Stairs to enter Entrance Stairs-Rails: None Entrance Stairs-Number of Steps: 2 Alternate Level Stairs-Number of Steps: 2 (sunken living room) Home Layout: Two level Home Equipment: Cane - single point;Rollator (4 wheels);BSC/3in1;Grab bars - tub/shower Additional Comments: Pt has a high energy dog at home    Prior Function Prior Level of Function : Independent/Modified Independent;Needs assist       Physical Assist : ADLs (physical)   ADLs (physical): IADLs Mobility Comments: Indep, ambulating without AD. Denies fall hx. ADLs Comments: Indep with basic self care. Pt reports occassionally using BSC as a shower chair to sit and bath. She gets dressed sitting down. Pt reports difficulty bending over. Her granddaughter assists with getting laundry out of the machines and loading/unloading  dishwasher.  Drives. Takes her dog on walks. Raising grandchild.     Extremity/Trunk Assessment   Upper Extremity Assessment Upper Extremity Assessment: Overall WFL for tasks assessed;Right hand dominant    Lower Extremity Assessment Lower Extremity Assessment: LLE deficits/detail LLE Deficits / Details: Pt POD 1 s/p THA. Decreased hip and knee AROM. Pt resistant to PROM attempts. Grossly 2+/5 to 3-/5 strength. LLE: Unable to fully assess due to pain LLE Sensation: WNL LLE Coordination: decreased gross motor    Cervical / Trunk Assessment Cervical / Trunk Assessment: Normal  Communication   Communication Communication: No apparent difficulties    Cognition Arousal: Alert Behavior During Therapy: Anxious   PT - Cognitive impairments: No apparent impairments                       PT - Cognition Comments: Pt A,Ox4. She was very nervous to attempt mobility, anticipating the pain. Pt took increased time to complete all tasks. Following commands: Intact       Cueing Cueing Techniques: Verbal cues, Gestural cues, Tactile cues     General Comments General comments (skin integrity, edema, etc.): Pt greeted on RA with SpO2 98%. Pt c/o dizziness, lightheadedness, and nausea upon standing, unable to maintain upright posture. Assessed BP upon sitting EOB 87/62 (70). Increased to 111/67 (80) once supine in bed.    Exercises Other Exercises Other Exercises: Educated pt on L THA HEP and provided handout. Reviewed and demonstrated each exercises  with her verbalizing understanding.   Assessment/Plan    PT Assessment Patient needs continued PT services  PT Problem List Decreased strength;Decreased range of motion;Decreased activity tolerance;Decreased balance;Decreased mobility;Decreased knowledge of use of DME;Decreased safety awareness;Pain       PT Treatment Interventions DME instruction;Gait training;Stair training;Functional mobility training;Therapeutic activities;Therapeutic  exercise;Balance training;Patient/family education    PT Goals (Current goals can be found in the Care Plan section)  Acute Rehab PT Goals Patient Stated Goal: Return Home and regain independence PT Goal Formulation: With patient Time For Goal Achievement: 03/04/24 Potential to Achieve Goals: Good    Frequency 7X/week     Co-evaluation               AM-PAC PT 6 Clicks Mobility  Outcome Measure Help needed turning from your back to your side while in a flat bed without using bedrails?: A Little Help needed moving from lying on your back to sitting on the side of a flat bed without using bedrails?: A Little Help needed moving to and from a bed to a chair (including a wheelchair)?: A Little Help needed standing up from a chair using your arms (e.g., wheelchair or bedside chair)?: A Little Help needed to walk in hospital room?: A Lot Help needed climbing 3-5 steps with a railing? : A Lot 6 Click Score: 16    End of Session Equipment Utilized During Treatment: Gait belt Activity Tolerance: Patient limited by pain;Treatment limited secondary to medical complications (Comment) (symptomatic orthostatic hypotension) Patient left: in bed;with call bell/phone within reach;with bed alarm set;with SCD's reapplied Nurse Communication: Mobility status PT Visit Diagnosis: Difficulty in walking, not elsewhere classified (R26.2);Muscle weakness (generalized) (M62.81);Other abnormalities of gait and mobility (R26.89);Unsteadiness on feet (R26.81)    Time: 8860-8787 PT Time Calculation (min) (ACUTE ONLY): 33 min   Charges:   PT Evaluation $PT Eval Moderate Complexity: 1 Mod PT Treatments $Therapeutic Activity: 8-22 mins PT General Charges $$ ACUTE PT VISIT: 1 Visit         Randall SAUNDERS, PT, DPT Acute Rehabilitation Services Office: [phone] Secure Chat Preferred  Delon CHRISTELLA Callander 02/19/2024, 1:25 PM

## 2024-02-19 NOTE — Progress Notes (Signed)
 Subjective: 1 Day Post-Op Procedure(s) (LRB): ARTHROPLASTY, HIP, TOTAL, ANTERIOR APPROACH (Left) Patient reports pain as moderate.  Too much pain and nausea to work with PT yesterday.  Objective: Vital signs in last 24 hours: Temp:  [97.6 F (36.4 C)-98.9 F (37.2 C)] 98.9 F (37.2 C) (11/12 0225) Pulse Rate:  [73-95] 95 (11/12 0225) Resp:  [9-20] 16 (11/12 0225) BP: (112-144)/(77-92) 134/84 (11/12 0225) SpO2:  [93 %-100 %] 100 % (11/12 0225)  Intake/Output from previous day: 11/11 0701 - 11/12 0700 In: 2470.2 [P.O.:100; I.V.:2070.2; IV Piggyback:300] Out: 1450 [Urine:1150; Blood:300] Intake/Output this shift: No intake/output data recorded.  Recent Labs    02/19/24 0456  HGB 10.0*   Recent Labs    02/19/24 0456  WBC 10.5  RBC 3.42*  HCT 29.7*  PLT 258   Recent Labs    02/19/24 0456  NA 136  K 3.7  CL 102  CO2 23  BUN 8  CREATININE 0.91  GLUCOSE 116*  CALCIUM 8.3*   No results for input(s): LABPT, INR in the last 72 hours.  Sensation intact distally Intact pulses distally Dorsiflexion/Plantar flexion intact Incision: dressing C/D/I   Assessment/Plan: 1 Day Post-Op Procedure(s) (LRB): ARTHROPLASTY, HIP, TOTAL, ANTERIOR APPROACH (Left) Up with therapy      Lonni CINDERELLA Poli 02/19/2024, 7:14 AM

## 2024-02-19 NOTE — Progress Notes (Addendum)
 This RN tried to remove foley this am and pt refused, stating that she wanted to wait a couple more hours, closer to PT before removing it.    Bari HERO Alvar Malinoski

## 2024-02-20 NOTE — Progress Notes (Signed)
 Patient ID: Kathy Nguyen, female   DOB: 16-Oct-1965, 58 y.o.   MRN: [id number] The patient is awake and alert and eating her breakfast.  She is waiting for physical.  To be to come by.  This is postop day 2.  Her Foley catheter is in place and she understands that we need to have that removed today in order to progress her mobility and decrease the risk of UTI.  Her left operative hip is stable.  Mobility has been limited by orthostasis as well as pain control.  Hopefully in a day or 2 will be able to have her transition.  Apparently she does not have great support at home in terms of assistance.

## 2024-02-20 NOTE — Plan of Care (Signed)

## 2024-02-20 NOTE — Progress Notes (Signed)
 Transition of Care [facility]) - Inpatient Brief Assessment   Patient Details  Name: Kathy Nguyen MRN: [id number] Date of Birth: 1965/07/03  [facility] CM/SW Contact:    Kathy JONELLE Joe, RN Phone Number: 02/20/2024, 10:31 AM   Clinical Narrative: CM met with the patient at the bedside and patient states that she lives with granddaughter, significant other at the home.  The significant other works during the day and patient states that she hopes to return home once pain is better and she is able to mobilize more readily post surgery.  DME in the home includes Rolator and 3:1.  Patient was set up with [facility] home health prior to surgery with HH orders in place by MD.  Patient sat up at the side of the bed this am and foley plans to be removed by bedside nursing today per orders.  PT evaluation pending but patient plans to go home with home health in the next 1-2 days when cleared by PT.  No other IP Care management needs at this time   Transition of Care Asessment: Insurance and Status: (P) Insurance coverage has been reviewed Patient has primary care physician: (P) Yes Home environment has been reviewed: (P) from home with significant other Prior level of function:: (P) self Prior/Current Home Services: (P) No current home services (Patient was set up with [facility] prior to surgery) Social Drivers of Health Review: (P) SDOH reviewed interventions complete Readmission risk has been reviewed: (P) Yes Transition of care needs: (P) transition of care needs identified, TOC will continue to follow

## 2024-02-20 NOTE — Progress Notes (Signed)
 Physical Therapy Treatment Patient Details Name: Kathy Nguyen MRN: [id number] DOB: 1965/11/14 Today's Date: 02/20/2024   History of Present Illness 58 y.o. female presented to [facility] 11/11 for elective L THA. 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    PT Comments  Pt greeted seated EOB, pleasant and agreeable to PT session. Donned bilateral ted hose prior to mobility. Pt denied dizziness or lightheadedness throughout session and BP remained stable. She advanced OOB mobility ambulating a short distance using RW with CGA and a chair follow. Educated pt on stair training in accordance with her home set-up. She completed three short steps twice using RW with CGA. Pt ascended with RLE backwards and descended with LLE forwards. Discussed how her family should be positioned to support her and provided handout on stairs using walker. Reviewed L THA HEP and encouraged pt to complete exercises while seated in recliner chair. Will continue to follow acutely and advance appropriately.      02/20/24 1100  Vital Signs  Patient Position (if appropriate) Orthostatic Vitals  Orthostatic Sitting  BP- Sitting (!) 114/92  Orthostatic Standing at 0 minutes  BP- Standing at 0 minutes 117/82  Orthostatic Standing at 3 minutes  BP- Standing at 3 minutes 122/82     If plan is discharge home, recommend the following: Assistance with cooking/housework;Assist for transportation;Help with stairs or ramp for entrance;A lot of help with bathing/dressing/bathroom;A little help with walking and/or transfers   Can travel by private vehicle        Equipment Recommendations  Rolling walker (2 wheels);BSC/3in1    Recommendations for Other Services       Precautions / Restrictions Precautions Precautions: Fall Recall of Precautions/Restrictions: Intact Precaution/Restrictions Comments: Direct anterior approach, no hip precautions Restrictions Weight Bearing Restrictions Per Provider Order:  Yes LLE Weight Bearing Per Provider Order: Weight bearing as tolerated     Mobility  Bed Mobility               General bed mobility comments: Not assessed. Pt greeted seated EOB.    Transfers Overall transfer level: Needs assistance Equipment used: Rolling walker (2 wheels) Transfers: Sit to/from Stand, Bed to chair/wheelchair/BSC Sit to Stand: Contact guard assist   Step pivot transfers: Contact guard assist       General transfer comment: Pt stood from lowest bed height. She demonstrated proper hand placement using RW. Powered up with light assist. Transferred to recliner chair. Good eccentric control.    Ambulation/Gait Ambulation/Gait assistance: Contact guard assist, +2 safety/equipment (Chair Follow) Gait Distance (Feet): 15 Feet Assistive device: Rolling walker (2 wheels) Gait Pattern/deviations: Step-to pattern, Decreased step length - right, Decreased step length - left, Decreased stance time - left, Decreased weight shift to left, Antalgic, Narrow base of support Gait velocity: decreased     General Gait Details: Pt took short slow steps with heavy reliance on BUE support on RW to offload LLE. She self-limited weight acceptance on LLE. Pt maintained upright posture and close proximity to RW.   Stairs Stairs: Yes Stairs assistance: Contact guard assist Stair Management: With walker, Backwards, Forwards, Step to pattern Number of Stairs: 3 (x2) General stair comments: Pt reports her steps at home are shorter, so practiced on lower steps in ortho gym. Educated pt to ascend with RLE and descend with LLE. Demonstrated proper use of RW on stairs. She went up backwards and down forwards one step at a time. Assist to move RW up/down the steps and provide stabilization on  anterior crossbar. Discussed how family should be positioned to support her at home.   Wheelchair Mobility     Tilt Bed    Modified Rankin (Stroke Patients Only)       Balance Overall  balance assessment: Needs assistance Sitting-balance support: No upper extremity supported, Feet supported Sitting balance-Leahy Scale: Fair     Standing balance support: Bilateral upper extremity supported, Reliant on assistive device for balance Standing balance-Leahy Scale: Poor Standing balance comment: Pt dependent on RW.                            Communication Communication Communication: No apparent difficulties  Cognition Arousal: Alert Behavior During Therapy: Anxious   PT - Cognitive impairments: No apparent impairments                         Following commands: Intact      Cueing Cueing Techniques: Verbal cues, Gestural cues  Exercises Other Exercises Other Exercises: Reviewed HEP and instructed pt to completed LAQs, quad sets, glute sets, and SLR prior to second therapy session this afternoon.    General Comments General comments (skin integrity, edema, etc.): Donned ted hose on BLE. Re-assessed orthostatics and pt asymptomatic.      Pertinent Vitals/Pain Pain Assessment Pain Assessment: Faces Faces Pain Scale: Hurts little more Pain Location: L hip Pain Descriptors / Indicators: Discomfort, Aching, Operative site guarding Pain Intervention(s): Monitored during session, Limited activity within patient's tolerance, Repositioned    Home Living                          Prior Function            PT Goals (current goals can now be found in the care plan section) Acute Rehab PT Goals Patient Stated Goal: return home tomorrow PT Goal Formulation: With patient Time For Goal Achievement: 03/04/24 Potential to Achieve Goals: Good Progress towards PT goals: Progressing toward goals    Frequency    7X/week      PT Plan      Co-evaluation              AM-PAC PT 6 Clicks Mobility   Outcome Measure  Help needed turning from your back to your side while in a flat bed without using bedrails?: A Little Help  needed moving from lying on your back to sitting on the side of a flat bed without using bedrails?: A Little Help needed moving to and from a bed to a chair (including a wheelchair)?: A Little Help needed standing up from a chair using your arms (e.g., wheelchair or bedside chair)?: A Little Help needed to walk in hospital room?: A Little Help needed climbing 3-5 steps with a railing? : A Little 6 Click Score: 18    End of Session Equipment Utilized During Treatment: Gait belt Activity Tolerance: Patient tolerated treatment well;Patient limited by pain Patient left: in chair;with call bell/phone within reach;with chair alarm set Nurse Communication: Mobility status PT Visit Diagnosis: Difficulty in walking, not elsewhere classified (R26.2);Muscle weakness (generalized) (M62.81);Other abnormalities of gait and mobility (R26.89);Unsteadiness on feet (R26.81)     Time: 8947-8863 PT Time Calculation (min) (ACUTE ONLY): 44 min  Charges:    $Gait Training: 38-52 mins PT General Charges $$ ACUTE PT VISIT: 1 Visit  Randall SAUNDERS, PT, DPT Acute Rehabilitation Services Office: [phone] Secure Chat Preferred  Delon CHRISTELLA Callander 02/20/2024, 12:58 PM

## 2024-02-20 NOTE — Progress Notes (Signed)
 Physical Therapy Treatment Patient Details Name: Kathy Nguyen MRN: [id number] DOB: 09/07/1965 Today's Date: 02/20/2024   History of Present Illness 58 y.o. female presented to [facility] 11/11 for elective L THA. 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    PT Comments  Today's session focused on gait training. Pt demonstrated a step-to antalgic gait pattern with a NBOS. She ambulated a household distance using RW with CGA. Utilized a chair follow for safety with pt taking a seated rest break after ~39ft. Multi-modal cues for improved sequencing/technique. Pt was able to intermittently advance to a step-through pattern with BOS shoulder width apart. She continues to be limited by pain and decreased activity tolerance. Will continue to follow acutely and advance appropriately.     If plan is discharge home, recommend the following: Assistance with cooking/housework;Assist for transportation;Help with stairs or ramp for entrance;A lot of help with bathing/dressing/bathroom;A little help with walking and/or transfers   Can travel by private vehicle        Equipment Recommendations  Rolling walker (2 wheels);BSC/3in1    Recommendations for Other Services       Precautions / Restrictions Precautions Precautions: Fall Recall of Precautions/Restrictions: Intact Precaution/Restrictions Comments: Direct anterior approach, no hip precautions Restrictions Weight Bearing Restrictions Per Provider Order: Yes LLE Weight Bearing Per Provider Order: Weight bearing as tolerated     Mobility  Bed Mobility Overal bed mobility: Needs Assistance Bed Mobility: Supine to Sit     Supine to sit: Supervision, HOB elevated, Used rails Sit to supine: HOB elevated, Used rails, Min assist   General bed mobility comments: Pt sat up on L side of bed with increased time. She utilized BUE support to manage LLE. Educated pt on use of R foot to hook around ankle and support L leg. Pt pulled  on bed rails to aid in scooting. Returning to bed pt required assist to manage LLE d/t increasing pain and fatigue.    Transfers Overall transfer level: Needs assistance Equipment used: Rolling walker (2 wheels) Transfers: Sit to/from Stand Sit to Stand: Contact guard assist, Supervision   Step pivot transfers: Contact guard assist       General transfer comment: Pt stood from lowest bed height, recliner chair, and commode. Cues for proper hand placement using RW. Powered up with no physical assist. Close supervision-CGA for safety. Pt transferred in/out of the bathroom. Good eccentric control.    Ambulation/Gait Ambulation/Gait assistance: Contact guard assist, +2 safety/equipment (Chair Follow) Gait Distance (Feet): 60 Feet (x2, seated rest break between bouts) Assistive device: Rolling walker (2 wheels) Gait Pattern/deviations: Step-to pattern, Decreased step length - right, Decreased step length - left, Decreased stance time - left, Decreased weight shift to left, Antalgic, Narrow base of support, Step-through pattern Gait velocity: decreased Gait velocity interpretation: <1.8 ft/sec, indicate of risk for recurrent falls   General Gait Details: Pt continues to heavily depend on RW to advance LEs. Decreased weight acceptance on LLE d/t pain. Pt took short slow steps. She demonstrated a NBOS, cued shoulder width apart. Cues for increased step length. Pt maintained upright posture and good proximity to RW. She took intermittent standing rest breaks to alleviate her arms. No LOB.   Stairs Stairs: Yes Stairs assistance: Contact guard assist Stair Management: With walker, Backwards, Forwards, Step to pattern Number of Stairs: 3 (x2) General stair comments: Pt reports her steps at home are shorter, so practiced on lower steps in ortho gym. Educated pt to ascend with RLE and  descend with LLE. Demonstrated proper use of RW on stairs. She went up backwards and down forwards one step at a  time. Assist to move RW up/down the steps and provide stabilization on anterior crossbar. Discussed how family should be positioned to support her at home.   Wheelchair Mobility     Tilt Bed    Modified Rankin (Stroke Patients Only)       Balance Overall balance assessment: Needs assistance Sitting-balance support: No upper extremity supported, Feet supported Sitting balance-Leahy Scale: Fair     Standing balance support: Bilateral upper extremity supported, Reliant on assistive device for balance Standing balance-Leahy Scale: Poor Standing balance comment: Pt dependent on RW.                            Communication Communication Communication: No apparent difficulties  Cognition Arousal: Alert Behavior During Therapy: WFL for tasks assessed/performed, Anxious   PT - Cognitive impairments: No apparent impairments                         Following commands: Intact      Cueing Cueing Techniques: Verbal cues, Gestural cues  Exercises Other Exercises Other Exercises: Reviewed HEP and instructed pt to completed LAQs, quad sets, glute sets, and SLR prior to second therapy session this afternoon.    General Comments General comments (skin integrity, edema, etc.): VSS on RA      Pertinent Vitals/Pain Pain Assessment Pain Assessment: 0-10 Pain Score: 7  Faces Pain Scale: Hurts little more Pain Location: L hip Pain Descriptors / Indicators: Discomfort, Aching, Operative site guarding Pain Intervention(s): Monitored during session, Limited activity within patient's tolerance, Patient requesting pain meds-RN notified    Home Living                          Prior Function            PT Goals (current goals can now be found in the care plan section) Acute Rehab PT Goals Patient Stated Goal: Regain independence PT Goal Formulation: With patient Time For Goal Achievement: 03/04/24 Potential to Achieve Goals: Good Progress towards PT  goals: Progressing toward goals    Frequency    7X/week      PT Plan      Co-evaluation              AM-PAC PT 6 Clicks Mobility   Outcome Measure  Help needed turning from your back to your side while in a flat bed without using bedrails?: A Little Help needed moving from lying on your back to sitting on the side of a flat bed without using bedrails?: A Little Help needed moving to and from a bed to a chair (including a wheelchair)?: A Little Help needed standing up from a chair using your arms (e.g., wheelchair or bedside chair)?: A Little Help needed to walk in hospital room?: A Little Help needed climbing 3-5 steps with a railing? : A Little 6 Click Score: 18    End of Session Equipment Utilized During Treatment: Gait belt Activity Tolerance: Patient tolerated treatment well;Patient limited by pain;Patient limited by fatigue Patient left: in bed;with call bell/phone within reach;with bed alarm set Nurse Communication: Mobility status;Patient requests pain meds PT Visit Diagnosis: Difficulty in walking, not elsewhere classified (R26.2);Muscle weakness (generalized) (M62.81);Other abnormalities of gait and mobility (R26.89);Unsteadiness on feet (R26.81)     Time:  8381-8346 PT Time Calculation (min) (ACUTE ONLY): 35 min  Charges:    $Gait Training: 23-37 mins PT General Charges $$ ACUTE PT VISIT: 1 Visit                     Randall SAUNDERS, PT, DPT Acute Rehabilitation Services Office: [phone] Secure Chat Preferred  Kathy Nguyen 02/20/2024, 5:24 PM

## 2024-02-21 MED ORDER — OXYCODONE HCL 5 MG PO TABS: 5.0000 mg | ORAL_TABLET | ORAL | 0 refills | Status: DC | PRN

## 2024-02-21 MED ORDER — METHOCARBAMOL 500 MG PO TABS: 500.0000 mg | ORAL_TABLET | Freq: Four times a day (QID) | ORAL | 1 refills | Status: AC | PRN

## 2024-02-21 MED ORDER — ASPIRIN 81 MG PO CHEW: 81.0000 mg | CHEWABLE_TABLET | Freq: Two times a day (BID) | ORAL | 0 refills | Status: AC

## 2024-02-21 NOTE — TOC Transition Note (Incomplete)
 Transition of Care [facility]) - Discharge Note   Patient Details  Name: Kathy Nguyen MRN: [id number] Date of Birth: April 30, 1965  [facility]) CM/SW Contact:  Rosalva Jon Bloch, RN Phone Number: 02/21/2024, 3:08 PM   Clinical Narrative:    Patient will DC to: Anticipated DC date: Family notified: Transport by:   Per MD patient ready for DC to . RN, patient, patient's family, and facility notified of DC. Discharge Summary and FL2 sent to facility. RN to call report prior to discharge (). DC packet on chart. Ambulance transport requested for patient.   RNCM will sign off for now as intervention is no longer needed. Please consult us  again if new needs arise.    Final next level of care: Home w Home Health Services Barriers to Discharge: No Barriers Identified   Patient Goals and CMS Choice     Choice offered to / list presented to : Patient      Discharge Placement                       Discharge Plan and Services Additional resources added to the After Visit Summary for                  DME Arranged: Walker rolling DME Agency: [employer] Date DME Agency Contacted: 02/21/24 Time DME Agency Contacted: 1506 Representative spoke with at DME Agency: [facility] HH Arranged: PT HH Agency: [facility]        Social Drivers of Health (SDOH) Interventions SDOH Screenings   Food Insecurity: No Food Insecurity (02/18/2024)  Housing: Low Risk  (02/19/2024)  Transportation Needs: No Transportation Needs (02/18/2024)  Utilities: Not At Risk (02/18/2024)  Tobacco Use: Low Risk  (02/18/2024)     Readmission Risk Interventions    02/20/2024   10:30 AM  Readmission Risk Prevention Plan  Post Dischage Appt Complete  Medication Screening Complete  Transportation Screening Complete

## 2024-02-21 NOTE — Progress Notes (Signed)
 Patient discharging to home via private transportation, AVS reviewed at bedside with no further questions, iv access removed, DME walker delivered at bedside.

## 2024-02-21 NOTE — Discharge Summary (Signed)
 Patient ID: Kathy Nguyen MRN: [id number] DOB/AGE: 09-12-1965 58 y.o.  Admit date: 02/18/2024 Discharge date: 02/21/2024  Admission Diagnoses:  Principal Problem:   Unilateral primary osteoarthritis, left hip Active Problems:   Status post total replacement of left hip   Discharge Diagnoses:  Same  Past Medical History:  Diagnosis Date   Anxiety    Arthritis    Depression    Family history of adverse reaction to anesthesia    Father had a hard time awaking   GERD (gastroesophageal reflux disease)    Headache    History of hiatal hernia    Hypertension    Pneumonia    PONV (postoperative nausea and vomiting)     Surgeries: Procedure(s): ARTHROPLASTY, HIP, TOTAL, ANTERIOR APPROACH on 02/18/2024   Consultants:   Discharged Condition: Improved  Hospital Course: MACENZIE BURFORD is an 58 y.o. female who was admitted 02/18/2024 for operative treatment ofUnilateral primary osteoarthritis, left hip. Patient has severe unremitting pain that affects sleep, daily activities, and work/hobbies. After pre-op clearance the patient was taken to the operating room on 02/18/2024 and underwent  Procedure(s): ARTHROPLASTY, HIP, TOTAL, ANTERIOR APPROACH.    Patient was given perioperative antibiotics:  Anti-infectives (From admission, onward)    Start     Dose/Rate Route Frequency Ordered Stop   02/18/24 1230  ceFAZolin  (ANCEF ) IVPB 2g/100 mL premix        2 g 200 mL/hr over 30 Minutes Intravenous Every 6 hours 02/18/24 1220 02/18/24 1851   02/18/24 0615  ceFAZolin  (ANCEF ) IVPB 2g/100 mL premix        2 g 200 mL/hr over 30 Minutes Intravenous On call to O.R. 02/18/24 0613 02/18/24 0810   02/18/24 0615  ceFAZolin  (ANCEF ) 2-4 GM/100ML-% IVPB       Note to Pharmacy: Ezequiel Henri: cabinet override      02/18/24 0615 02/18/24 0741        Patient was given sequential compression devices, early ambulation, and chemoprophylaxis to prevent DVT.  Inpatient Morphine Milligram  Equivalents Per Day 11/11 - 11/14   Values displayed are in units of MME/Day    Order Start / End Date 11/11 11/12 Yesterday Today    oxyCODONE  (Oxy IR/ROXICODONE ) immediate release tablet 5 mg 11/11 - 11/11 0 of Unknown -- -- --    oxyCODONE  (ROXICODONE ) 5 MG/5ML solution 5 mg 11/11 - 11/11 0 of Unknown -- -- --      Group total: 0 of Unknown       HYDROmorphone  (DILAUDID ) injection 0.25-0.5 mg 11/11 - 11/11 40 of 40-80 -- -- --    fentaNYL  citrate (PF) (SUBLIMAZE ) injection 11/11 - 11/11 *30 of 30 -- -- --    HYDROmorphone  (DILAUDID ) injection 0.5-1 mg 11/11 - 11/12 30 of 40-80 20 of 20-40 -- --    oxyCODONE  (Oxy IR/ROXICODONE ) immediate release tablet 5-10 mg 11/11 - No end date 15 of 30-60 0 of 45-90 0 of 45-90 0 of 45-90    oxyCODONE  (Oxy IR/ROXICODONE ) immediate release tablet 10-15 mg 11/11 - No end date 45 of 60-90 67.5 of 90-135 67.5 of 90-135 45 of 90-135    morphine (PF) 2 MG/ML injection 1-2 mg 11/12 - No end date -- 12 of 12-24 21 of 18-36 6 of 18-36    Daily Totals  * 160 of Unknown (at least 200-340) 99.5 of 167-289 88.5 of 153-261 51 of 153-261  *One-Step medication  Calculation Errors     Order Type Date Details   oxyCODONE  (Oxy IR/ROXICODONE )  immediate release tablet 5 mg Ordered Dose -- Insufficient frequency information   oxyCODONE  (ROXICODONE ) 5 MG/5ML solution 5 mg Ordered Dose -- Insufficient frequency information            Patient benefited maximally from hospital stay and there were no complications.    Recent vital signs: Patient Vitals for the past 24 hrs:  BP Temp Temp src Pulse Resp SpO2  02/21/24 1510 125/74 98.7 F (37.1 C) Oral 100 17 99 %  02/21/24 0821 123/78 98.7 F (37.1 C) Oral (!) 107 16 99 %  02/21/24 0446 126/83 99.1 F (37.3 C) Oral 89 -- 99 %  02/20/24 1958 109/63 100 F (37.8 C) -- 85 18 96 %     Recent laboratory studies:  Recent Labs    02/19/24 0456  WBC 10.5  HGB 10.0*  HCT 29.7*  PLT 258  NA 136  K 3.7  CL 102  CO2 23   BUN 8  CREATININE 0.91  GLUCOSE 116*  CALCIUM 8.3*     Discharge Medications:   Allergies as of 02/21/2024       Reactions   Biaxin [clarithromycin] Rash   Penicillins Hives, Rash   TOLERATED ANCEF  09/08/2019.        Medication List     TAKE these medications    acetaminophen  500 MG tablet Commonly known as: TYLENOL  Take 1,000 mg by mouth every 6 (six) hours as needed for moderate pain (pain score 4-6).   ALPRAZolam  0.25 MG tablet Commonly known as: XANAX  Take 0.25 mg by mouth 2 (two) times daily as needed for anxiety.   aspirin  81 MG chewable tablet Chew 1 tablet (81 mg total) by mouth 2 (two) times daily.   Botox 100 units Solr injection Generic drug: botulinum toxin Type A Inject 100 Units into the muscle every 3 (three) months.   buPROPion  150 MG 24 hr tablet Commonly known as: WELLBUTRIN  XL Take 150 mg by mouth daily.   dicyclomine 10 MG capsule Commonly known as: BENTYL Take 10 mg by mouth 3 (three) times daily as needed for spasms.   FLORAJEN WOMEN PO Take 1 tablet by mouth daily.   gabapentin  100 MG capsule Commonly known as: NEURONTIN  TAKE 1 CAPSULE BY MOUTH DAILY AS NEEDED   methocarbamol  500 MG tablet Commonly known as: ROBAXIN  Take 1 tablet (500 mg total) by mouth every 6 (six) hours as needed for muscle spasms.   multivitamin with minerals Tabs tablet Take 1 tablet by mouth daily.   Nurtec 75 MG Tbdp Generic drug: Rimegepant Sulfate Take 75 mg by mouth daily as needed (migraine).   omeprazole 20 MG capsule Commonly known as: PRILOSEC Take 20 mg by mouth in the morning and at bedtime.   ondansetron  8 MG tablet Commonly known as: ZOFRAN  Take 8 mg by mouth every 8 (eight) hours as needed for nausea or vomiting.   oxyCODONE  5 MG immediate release tablet Commonly known as: Oxy IR/ROXICODONE  Take 1-2 tablets (5-10 mg total) by mouth every 4 (four) hours as needed for moderate pain (pain score 4-6) (pain score 4-6).   polyethylene  glycol 17 g packet Commonly known as: MIRALAX  / GLYCOLAX  Take 17 g by mouth daily as needed for moderate constipation.   PRESCRIPTION MEDICATION Progesterone 100 mg / Estradiol  1 mg & Testosterone - Hormone Replacement Therapy done every 4 months   Qulipta 60 MG Tabs Generic drug: Atogepant Take 60 mg by mouth at bedtime.   tirzepatide 2.5 MG/0.5ML Pen Commonly known as:  ZEPBOUND Inject 2.5 mg into the skin once a week.   triamterene-hydrochlorothiazide  37.5-25 MG tablet Commonly known as: MAXZIDE-25 Take 0.5 tablets by mouth daily.   valACYclovir  500 MG tablet Commonly known as: VALTREX  Take 500 mg by mouth daily as needed (outbreak).               Durable Medical Equipment  (From admission, onward)           Start     Ordered   02/18/24 1221  DME 3 n 1  Once        02/18/24 1220   02/18/24 1221  DME Walker rolling  Once       Question Answer Comment  Walker: With 5 Inch Wheels   Patient needs a walker to treat with the following condition Status post total replacement of left hip      02/18/24 1220            Diagnostic Studies: DG Pelvis Portable Result Date: 02/18/2024 CLINICAL DATA:  Status post left hip replacement. EXAM: PORTABLE PELVIS 1-2 VIEWS COMPARISON:  Preoperative imaging. FINDINGS: Left hip arthroplasty in expected alignment. No periprosthetic lucency or fracture. Recent postsurgical change includes air and edema in the soft tissues. Previous right hip arthroplasty. IMPRESSION: Left hip arthroplasty without immediate postoperative complication. Electronically Signed   By: Andrea Gasman M.D.   On: 02/18/2024 12:19   DG HIP UNILAT WITH PELVIS 1V LEFT Result Date: 02/18/2024 CLINICAL DATA:  Elective surgery. EXAM: DG HIP (WITH OR WITHOUT PELVIS) 1V*L* COMPARISON:  12/23/2023 FINDINGS: Three fluoroscopic spot views of the pelvis and left hip obtained in the operating room. Images during hip arthroplasty. Fluoroscopy time 17.4 seconds. Dose  1.7716 mGy. IMPRESSION: Intraoperative fluoroscopy during left hip arthroplasty. Electronically Signed   By: Andrea Gasman M.D.   On: 02/18/2024 12:18   DG C-Arm 1-60 Min-No Report Result Date: 02/18/2024 Fluoroscopy was utilized by the requesting physician.  No radiographic interpretation.    Disposition: Discharge disposition: 01-Home or Self Care          Follow-up Information     Vernetta Lonni GRADE, MD Follow up in 2 week(s).   Specialty: Orthopedic Surgery Contact information: [address] [place]  [place] [address] [phone]         [place], [facility] Follow up.   Specialty: Home Health Services Why: [facility] home health will provide home health PT.  They will call you in the next 24-48 hours to set up home health. Contact information: [address] [place] [address] [phone]         Fernand Tracey LABOR, MD Follow up.   Specialty: Family Medicine Contact information: [address] [place] [address] [phone]                  Signed: Lonni GRADE Vernetta 02/21/2024, 4:29 PM

## 2024-02-21 NOTE — Progress Notes (Signed)
 Physical Therapy Treatment Patient Details Name: Kathy Nguyen MRN: [id number] DOB: [birth year] Today's Date: 02/21/2024   History of Present Illness 58 y.o. female presented to [facility] 11/11 for elective L THA. 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    PT Comments  Pt is making steady progress towards her acute PT goals demonstrated by advanced functional mobility with decreased physical assistance. She recalled gait and stair education. Pt complete 3 short steps twice using RW backwards/forwards. Pt demonstrated improvement in clearing L foot when descending the step. She increased gait distance, ambulating using RW. Pt was able to advance to a step-through antalgic gait pattern. She continues to heavily rely on BUE support on RW to offload LLE and requires intermittent standing rest breaks. Pt feels ready and safe for discharge home with HHPT. I have answered all her questions related to mobility.      If plan is discharge home, recommend the following: Assistance with cooking/housework;Assist for transportation;Help with stairs or ramp for entrance;A lot of help with bathing/dressing/bathroom;A little help with walking and/or transfers   Can travel by private vehicle        Equipment Recommendations  Rolling walker (2 wheels)    Recommendations for Other Services       Precautions / Restrictions Precautions Precautions: Fall Recall of Precautions/Restrictions: Intact Precaution/Restrictions Comments: Direct anterior approach, no hip precautions Restrictions Weight Bearing Restrictions Per Provider Order: Yes LLE Weight Bearing Per Provider Order: Weight bearing as tolerated     Mobility  Bed Mobility Overal bed mobility: Needs Assistance Bed Mobility: Supine to Sit     Supine to sit: Supervision     General bed mobility comments: Pt sat up on R side of bed with increased time. HOB flat and no use of bed rail. Pt held RLE with BUE support.     Transfers Overall transfer level: Needs assistance Equipment used: Rolling walker (2 wheels) Transfers: Sit to/from Stand, Bed to chair/wheelchair/BSC Sit to Stand: Supervision   Step pivot transfers: Supervision       General transfer comment: Pt demonstrated proper hand placement using RW. Powered up without physical assist. Transferred to recliner chair. Good eccentric control.    Ambulation/Gait Ambulation/Gait assistance: Supervision Gait Distance (Feet): 115 Feet Assistive device: Rolling walker (2 wheels) Gait Pattern/deviations: Step-to pattern, Decreased step length - right, Decreased step length - left, Decreased stance time - left, Decreased weight shift to left, Antalgic, Narrow base of support, Step-through pattern Gait velocity: decreased Gait velocity interpretation: <1.8 ft/sec, indicate of risk for recurrent falls   General Gait Details: Pt ambulated with a step-to advancing to a step-through gait pattern. She heavily relied on BLE support on RW to offload LE. Pt navigated room/hallway well, no LOB. She maintained upright posture with good proximity to RW. Pt took intermittent standing rest breaks.   Stairs Stairs: Yes Stairs assistance: Contact guard assist Stair Management: With walker, Backwards, Forwards, Step to pattern Number of Stairs: 3 (x2) General stair comments: Pt recalled sequencing of RW up/down the steps and to ascend with RLE and descend with LLE. CGA for safety/stability. Pt completed 3 short steps twice.   Wheelchair Mobility     Tilt Bed    Modified Rankin (Stroke Patients Only)       Balance Overall balance assessment: Needs assistance Sitting-balance support: No upper extremity supported, Feet supported Sitting balance-Leahy Scale: Fair     Standing balance support: Bilateral upper extremity supported, Reliant on assistive device for balance Standing  balance-Leahy Scale: Poor Standing balance comment: Pt dependent on RW  and/or railing.                            Communication Communication Communication: No apparent difficulties  Cognition Arousal: Alert Behavior During Therapy: WFL for tasks assessed/performed, Anxious   PT - Cognitive impairments: No apparent impairments                         Following commands: Intact      Cueing Cueing Techniques: Verbal cues, Gestural cues  Exercises      General Comments General comments (skin integrity, edema, etc.): VSS on RA      Pertinent Vitals/Pain Pain Assessment Pain Assessment: Faces Faces Pain Scale: Hurts little more Pain Location: L hip Pain Descriptors / Indicators: Discomfort, Aching, Operative site guarding Pain Intervention(s): Monitored during session, Limited activity within patient's tolerance, Patient requesting pain meds-RN notified, Repositioned    Home Living                          Prior Function            PT Goals (current goals can now be found in the care plan section) Acute Rehab PT Goals Patient Stated Goal: Return Home and regain independence PT Goal Formulation: With patient Time For Goal Achievement: 03/04/24 Potential to Achieve Goals: Good Progress towards PT goals: Progressing toward goals    Frequency    7X/week      PT Plan      Co-evaluation              AM-PAC PT 6 Clicks Mobility   Outcome Measure  Help needed turning from your back to your side while in a flat bed without using bedrails?: A Little Help needed moving from lying on your back to sitting on the side of a flat bed without using bedrails?: A Little Help needed moving to and from a bed to a chair (including a wheelchair)?: A Little Help needed standing up from a chair using your arms (e.g., wheelchair or bedside chair)?: A Little Help needed to walk in hospital room?: A Little Help needed climbing 3-5 steps with a railing? : A Little 6 Click Score: 18    End of Session Equipment  Utilized During Treatment: Gait belt Activity Tolerance: Patient tolerated treatment well;Patient limited by pain;Patient limited by fatigue Patient left: in chair;with call bell/phone within reach;with chair alarm set;with nursing/sitter in room Nurse Communication: Mobility status;Patient requests pain meds PT Visit Diagnosis: Difficulty in walking, not elsewhere classified (R26.2);Muscle weakness (generalized) (M62.81);Other abnormalities of gait and mobility (R26.89);Unsteadiness on feet (R26.81)     Time: 9049-8975 PT Time Calculation (min) (ACUTE ONLY): 34 min  Charges:    $Gait Training: 23-37 mins PT General Charges $$ ACUTE PT VISIT: 1 Visit                     Randall SAUNDERS, PT, DPT Acute Rehabilitation Services Office: [phone] Secure Chat Preferred  Delon CHRISTELLA Callander 02/21/2024, 11:26 AM

## 2024-02-21 NOTE — Progress Notes (Signed)
 Patient ID: Kathy Nguyen, female   DOB: [birth year], 58 y.o.   MRN: [id number] The patient is doing much better overall.  She had a better day yesterday afternoon.  Her vital signs are stable and her left operative hip is stable.  She can be discharged home this afternoon after working with therapy.

## 2024-02-24 ENCOUNTER — Telehealth: Payer: Self-pay | Admitting: *Deleted

## 2024-02-24 ENCOUNTER — Other Ambulatory Visit: Payer: Self-pay | Admitting: Orthopaedic Surgery

## 2024-02-24 MED ORDER — TIZANIDINE HCL 4 MG PO TABS: 4.0000 mg | ORAL_TABLET | Freq: Four times a day (QID) | ORAL | 0 refills | Status: DC | PRN

## 2024-02-24 MED ORDER — OXYCODONE HCL 5 MG PO TABS: 5.0000 mg | ORAL_TABLET | ORAL | 0 refills | Status: DC | PRN

## 2024-02-24 NOTE — Telephone Encounter (Signed)
 Patient called and states she been in pain (8-10) since being home. Her pain medication is prescribed 1-2 every 4 hours for pain 4-6. She has been taking 3 at times b/c her pain is higher than this. She is taking her muscle relaxer. Not elevating much, so we talked about this. She is swollen she states around her knee and down into her shin (she says she can barely walk b/c of the front of the leg so swollen and painful). We discussed the positioning during surgery as well. Taking her ASA as directed. She has only 6 tablets of Oxycodone  left and wants to know what to do. Thank you.

## 2024-02-27 ENCOUNTER — Telehealth: Payer: Self-pay

## 2024-02-27 DIAGNOSIS — Z96642 Presence of left artificial hip joint: Secondary | ICD-10-CM

## 2024-02-27 NOTE — Progress Notes (Signed)
 Complex Care Management Note Care Guide Note  02/27/2024 Name: Kathy Nguyen MRN: [id number] DOB: 1965-09-21   Complex Care Management Outreach Attempts: An unsuccessful telephone outreach was attempted today to offer the patient information about available complex care management services.  Follow Up Plan:  Additional outreach attempts will be made to offer the patient complex care management information and services.   Encounter Outcome:  No Answer  Dreama [facility]  [facility], [facility] VBCI Assistant Direct Dial: [phone]  Fax: [phone]

## 2024-02-28 NOTE — Progress Notes (Signed)
 Complex Care Management Note Care Guide Note  02/28/2024 Name: Kathy Nguyen MRN: [id number] DOB: 1966-02-14   Complex Care Management Outreach Attempts: A second unsuccessful outreach was attempted today to offer the patient with information about available complex care management services.  Follow Up Plan:  Additional outreach attempts will be made to offer the patient complex care management information and services.   Encounter Outcome:  No Answer  Dreama [facility]  [facility], [facility] VBCI Assistant Direct Dial: [phone]  Fax: [phone]

## 2024-03-02 ENCOUNTER — Ambulatory Visit: Admitting: Orthopaedic Surgery

## 2024-03-02 ENCOUNTER — Encounter: Payer: Self-pay | Admitting: Orthopaedic Surgery

## 2024-03-02 DIAGNOSIS — Z96642 Presence of left artificial hip joint: Secondary | ICD-10-CM

## 2024-03-02 MED ORDER — PREGABALIN 75 MG PO CAPS: 75.0000 mg | ORAL_CAPSULE | Freq: Two times a day (BID) | ORAL | 0 refills | Status: DC | PRN

## 2024-03-02 MED ORDER — OXYCODONE HCL 5 MG PO TABS: 5.0000 mg | ORAL_TABLET | Freq: Four times a day (QID) | ORAL | 0 refills | Status: AC | PRN

## 2024-03-02 NOTE — Progress Notes (Signed)
 The patient comes in today for first postoperative 1 month status post a left total hip arthroplasty.  She is only 58 years old but had significant amount of arthritis of the weightbearing surface of her left hip.  We have replaced her right hip remotely.  She is ambulating with a rolling walker and doing well.  She does need a refill of her pain medication.  She has been on low-dose gabapentin  for a long period of time and she is concerned about memory issues as a relates to reading about gabapentin .  She would like to try Lyrica  I think that is reasonable.  Her left hip incision looks good.  Steri-Strips are still in place.  She can get these wet in the shower and let them fall off on their own.  She has been on a baby aspirin  twice a day.  Her calf is soft so she can stop her baby aspirin .  There is no significant seroma.  I will send in some more oxycodone  and some Lyrica  for her.  She will slowly increase her activities as comfort allows.  Will see her back in a month to see how she is doing overall from a mobility standpoint but no x-rays are needed.

## 2024-03-09 NOTE — Progress Notes (Signed)
 Complex Care Management Note Care Guide Note  03/09/2024 Name: Kathy Nguyen MRN: [id number] DOB: 04-26-1965   Complex Care Management Outreach Attempts: A third unsuccessful outreach was attempted today to offer the patient with information about available complex care management services.  Follow Up Plan:  No further outreach attempts will be made at this time. We have been unable to contact the patient to offer or enroll patient in complex care management services.  Encounter Outcome:  No Answer  Dreama [facility]  [facility], [facility] VBCI Assistant Direct Dial: [phone]  Fax: [phone]

## 2024-03-12 NOTE — Progress Notes (Signed)
 Complex Care Management Note  Care Guide Note 03/12/2024 Name: Kathy Nguyen MRN: [id number] DOB: [birth year]  Kathy Nguyen is a 58 y.o. year old female who sees Fernand Tracey LABOR, MD for primary care. I reached out to Sharlet JAYSON Nim by phone today to offer complex care management services.  Kathy Nguyen was given information about Complex Care Management services today including:   The Complex Care Management services include support from the care team which includes your Nurse Care Manager, Clinical Social Worker, or Pharmacist.  The Complex Care Management team is here to help remove barriers to the health concerns and goals most important to you. Complex Care Management services are voluntary, and the patient may decline or stop services at any time by request to their care team member.   Complex Care Management Consent Status: Patient agreed to services and verbal consent obtained.   Follow up plan:  Telephone appointment with complex care management team member scheduled for:  03/17/24 at 2:00 p.m.   Encounter Outcome:  Patient Scheduled  Dreama [facility]  [facility], [facility] VBCI Assistant Direct Dial: [phone]  Fax: [phone]

## 2024-03-17 ENCOUNTER — Other Ambulatory Visit: Payer: Self-pay

## 2024-03-17 NOTE — Patient Outreach (Addendum)
 LCSW spoke to patient in regards to the referral (EMMI), Patient explained that she was in a lot of pain and had surgery about a month ago. Patient explained that she is on medication and plans to follow up her past therapist(Gwendolyn) that use to work at [employer] and she has therapist contact information.. Patient states that she plans to follow up to begin therapy again. LCSW discussed SDOH screenings completed on 11/11 which were negative. Patient confirmed no SDOH needs at this time. Patient informed LCSW that she would follow up therapist to get reconnected to therapy and was agreeable to LCSW following up on 04/22/2023 at 2:00 PM.  LCSW provided patient with LCSW's contact information if a need arises.

## 2024-03-30 ENCOUNTER — Ambulatory Visit: Admitting: Orthopaedic Surgery

## 2024-03-30 ENCOUNTER — Encounter: Payer: Self-pay | Admitting: Orthopaedic Surgery

## 2024-03-30 DIAGNOSIS — Z96642 Presence of left artificial hip joint: Secondary | ICD-10-CM

## 2024-03-30 NOTE — Progress Notes (Signed)
 Kathy Nguyen is doing great.  She is 6 weeks status post a left total hip replacement.  She is ambulate with a cane and knows to go slow.  She still reports numbness of her left hip.  She is try not to do too much with exercises.  We talked about her getting into a pool if needed and to work on her water aerobics and other activities as comfort allows.  She has a history of a right hip replacement as well so she knows what to do.  She is off of narcotics.  She has taken Lyrica  which has helped.  Her right hip actually moves smoothly and fluidly as does her more recent left hip.  Her hip flexors are weak and there is subjective numbness at the incision.  Overall though she looks great.  From our standpoint the next time we need to see is not for 6 months unless she is having issues.  Will have a standing AP pelvis at that visit.

## 2024-03-31 ENCOUNTER — Other Ambulatory Visit: Payer: Self-pay | Admitting: Orthopaedic Surgery

## 2024-04-10 ENCOUNTER — Other Ambulatory Visit: Payer: Self-pay | Admitting: Orthopaedic Surgery

## 2024-04-21 ENCOUNTER — Other Ambulatory Visit: Payer: Self-pay

## 2024-04-21 NOTE — Patient Instructions (Signed)
 Sharlet JAYSON Nim - I am sorry I was unable to reach you today for our scheduled appointment. I work with Fernand Tracey LABOR, MD and am calling to support your healthcare needs. Please contact me at [phone] at your earliest convenience. I look forward to speaking with you soon.   Thank you,   Olam Ally, MSW, LCSW [place]  [facility], [facility] Licensed Clinical Social Worker Direct Dial: [phone]

## 2024-04-21 NOTE — Patient Outreach (Signed)
 LCSW called patient for EMMI follow up call and was unable to reach pt. LCSW left voicemail informing pt that if patient had any social work needs to contact LCSW and left contact information.   Olam Ally, MSW, LCSW [place]  [facility], [facility] Licensed Clinical Social Worker Direct Dial: [phone]

## 2024-04-27 ENCOUNTER — Telehealth: Payer: Self-pay | Admitting: *Deleted

## 2024-04-27 NOTE — Telephone Encounter (Signed)
 Patient called stating that over the weekend she was sitting on the sofa and adjusted herself to sit sort of sideways and felt a terrible pain near the hip flexor/groin area. She has taken 800 mg Ibuprofen  once yesterday. She is able to walk as she did before and able to do things around the house, but is very sore in this area. Encouraged her to take Ibuprofen  several times daily along with backing off exercise and icing/heat if needed. She is taking her muscle relaxer as well. I told her if doesn't improve over the course of a few days to a week to call me back. Feel she just over stretched these muscles while sitting and adjusting herself. Any other recommendations.

## 2024-04-27 NOTE — Telephone Encounter (Signed)
 Patient aware to call back if symptoms don't improve.

## 2024-04-30 ENCOUNTER — Telehealth: Payer: Self-pay

## 2024-09-28 ENCOUNTER — Ambulatory Visit: Admitting: Orthopaedic Surgery
